# Patient Record
Sex: Female | Born: 1995 | Race: White | Hispanic: No | Marital: Single | Smoking: Former smoker
Health system: Southern US, Community
[De-identification: ages and names within clinical notes are randomized; demographics above are authoritative.]

## PROBLEM LIST (undated history)

## (undated) ENCOUNTER — Inpatient Hospital Stay (HOSPITAL_COMMUNITY): Payer: Self-pay

## (undated) DIAGNOSIS — R519 Headache, unspecified: Secondary | ICD-10-CM

## (undated) DIAGNOSIS — J45909 Unspecified asthma, uncomplicated: Secondary | ICD-10-CM

## (undated) DIAGNOSIS — F1210 Cannabis abuse, uncomplicated: Secondary | ICD-10-CM

## (undated) DIAGNOSIS — I480 Paroxysmal atrial fibrillation: Secondary | ICD-10-CM

## (undated) DIAGNOSIS — K219 Gastro-esophageal reflux disease without esophagitis: Secondary | ICD-10-CM

## (undated) DIAGNOSIS — F419 Anxiety disorder, unspecified: Secondary | ICD-10-CM

## (undated) DIAGNOSIS — I499 Cardiac arrhythmia, unspecified: Secondary | ICD-10-CM

## (undated) DIAGNOSIS — Z720 Tobacco use: Secondary | ICD-10-CM

## (undated) DIAGNOSIS — A419 Sepsis, unspecified organism: Secondary | ICD-10-CM

## (undated) DIAGNOSIS — Z87442 Personal history of urinary calculi: Secondary | ICD-10-CM

## (undated) DIAGNOSIS — K047 Periapical abscess without sinus: Secondary | ICD-10-CM

## (undated) HISTORY — PX: MOUTH SURGERY: SHX715

## (undated) HISTORY — PX: APPENDECTOMY: SHX54

---
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
## 2014-01-01 ENCOUNTER — Ambulatory Visit (INDEPENDENT_AMBULATORY_CARE_PROVIDER_SITE_OTHER): Payer: Medicaid Other | Admitting: Obstetrics

## 2014-01-01 ENCOUNTER — Encounter: Payer: Self-pay | Admitting: Obstetrics

## 2014-01-01 ENCOUNTER — Ambulatory Visit: Payer: Medicaid Other | Admitting: Obstetrics

## 2014-01-01 VITALS — BP 111/70 | HR 88 | Temp 98.4°F | Wt 112.0 lb

## 2014-01-01 DIAGNOSIS — N946 Dysmenorrhea, unspecified: Secondary | ICD-10-CM

## 2014-01-01 DIAGNOSIS — N920 Excessive and frequent menstruation with regular cycle: Secondary | ICD-10-CM

## 2014-01-01 LAB — POCT URINALYSIS DIPSTICK
SPEC GRAV UA: 1.01
pH, UA: 7

## 2014-01-01 NOTE — Progress Notes (Signed)
Subjective:     Laurie Richards is a 18 y.o. female here for a routine exam.  Current complaints: Pt states that she has Nexplanon, for 2 years, and has been having some abnormal bleeding.  Pt states that the bleeding has been worse for the past 6 months.  Pt states that bleeding may be every other month or she may have bleeding up to 6 weeks.  Pt states that she has cramps and bloating with cycles. Pt has no other complaints.  Pt states that she would like to change her birht control to Depo. Personal health questionnaire reviewed: yes.   Gynecologic History Patient's last menstrual period was 12/29/2013. Contraception: Nexplanon Last Pap: n/a Last mammogram: n/a  Obstetric History OB History  No data available     The following portions of the patient's history were reviewed and updated as appropriate: allergies, current medications, past family history, past medical history, past social history, past surgical history and problem list.  Review of Systems Pertinent items are noted in HPI.    Objective:    No exam performed today, Consult only.    Assessment:    AUB with Nexplanon.   Plan:    Education reviewed: safe sex/STD prevention and management of AUB with Nexplanon. Contraception: Nexplanon. Follow up in: 6 weeks. Lo Loestrin dispensed tid x 8 days.

## 2014-01-02 LAB — GC/CHLAMYDIA PROBE AMP
CT Probe RNA: NEGATIVE
GC Probe RNA: NEGATIVE

## 2014-01-09 ENCOUNTER — Encounter (HOSPITAL_COMMUNITY): Payer: Self-pay | Admitting: Emergency Medicine

## 2014-01-09 ENCOUNTER — Emergency Department (HOSPITAL_COMMUNITY)
Admission: EM | Admit: 2014-01-09 | Discharge: 2014-01-09 | Disposition: A | Discharge status: Home / Self Care | Payer: Medicaid Other | Attending: Emergency Medicine | Admitting: Emergency Medicine

## 2014-01-09 DIAGNOSIS — J029 Acute pharyngitis, unspecified: Secondary | ICD-10-CM | POA: Insufficient documentation

## 2014-01-09 NOTE — ED Provider Notes (Signed)
CSN: [id number]     Arrival date & time 01/09/14  0105 History   First MD Initiated Contact with Patient 01/09/14 0245     Chief Complaint  Patient presents with  . Sore Throat     Patient is a 18 y.o. female presenting with pharyngitis. The history is provided by the patient.  Sore Throat This is a new problem. The current episode started 2 days ago. The problem occurs constantly. The problem has been gradually worsening. The symptoms are aggravated by swallowing. Nothing relieves the symptoms.    PMH - none Past Surgical History  Procedure Laterality Date  . Mouth surgery     History reviewed. No pertinent family history. History  Substance Use Topics  . Smoking status: Never Smoker   . Smokeless tobacco: Not on file  . Alcohol Use: No   OB History   Grav Para Term Preterm Abortions TAB SAB Ect Mult Living                 Review of Systems  Constitutional: Negative for fever.  HENT: Positive for sore throat.   Respiratory: Positive for cough.   Gastrointestinal: Negative for vomiting.  Neurological: Negative for weakness.      Allergies  Acetaminophen-codeine  Home Medications   Current Outpatient Rx  Name  Route  Sig  Dispense  Refill  . acetaminophen (TYLENOL) 500 MG tablet   Oral   Take 500 mg by mouth once as needed. For pain          . ibuprofen (ADVIL,MOTRIN) 800 MG tablet      TAKE ONE TABLET UP TO 3 TIMES A DAY--8 HRS APART FOR PAIN AS NEEDED   30 tablet   1    BP 112/68  Pulse 103  Temp(Src) 98.5 F (36.9 C) (Oral)  Resp 17  Ht 5\' 6"  (1.676 m)  Wt 112 lb (50.803 kg)  BMI 18.09 kg/m2  SpO2 100%  LMP 01/03/2014 Physical Exam CONSTITUTIONAL: Well developed/well nourished HEAD: Normocephalic/atraumatic EYES: EOMI/PERRL ENMT: Mucous membranes moist, uvula midline, no erythema/exduates noted to oropharynx, normal phonation, no stridor/drooling noted NECK: supple no meningeal signs CV: S1/S2 noted, no murmurs/rubs/gallops noted LUNGS:  Lungs are clear to auscultation bilaterally, no apparent distress ABDOMEN: soft, nontender, no rebound or guarding NEURO: Pt is awake/alert, moves all extremitiesx4 EXTREMITIES: pulses normal, full ROM SKIN: warm, color normal PSYCH: no abnormalities of mood noted  ED Course  Procedures   Suspect viral infection, stable for d/c home MDM   Final diagnoses:  Sore throat    Nursing notes including past medical history and social history reviewed and considered in documentation     [name], MD 01/09/14 [phone]

## 2014-01-09 NOTE — ED Notes (Signed)
Pt c/o sore throat since Saturday.

## 2014-02-14 ENCOUNTER — Encounter: Payer: Self-pay | Admitting: Obstetrics

## 2014-05-08 ENCOUNTER — Telehealth: Payer: Self-pay | Admitting: *Deleted

## 2014-05-08 NOTE — Telephone Encounter (Signed)
Per Dr Clearance Coots- patient to monitor- call back next month if still no cycle and symptoms still present. May do qualitative Hcg test. Patient voiced understanding and is ok with that plan.

## 2014-05-08 NOTE — Telephone Encounter (Signed)
Patient c/o symptoms of pregnancy on birth control. 05/08/2014 5:00 Spoke with patient- she had Nexplanon put in 03/23/2012 and has had a monthly cycle since insertion except for 1 episode of heavy bleeding which was treated with OCP. Patient states she last had a cycle on 5/24. Patient reports nausea in am, hypersensitivity to odors, breast tenderness. OTC home UPT- negative. Will discuss symptoms with Dr Clearance Coots and call her back.

## 2014-07-23 ENCOUNTER — Institutional Professional Consult (permissible substitution): Payer: Self-pay | Admitting: Obstetrics

## 2014-08-03 ENCOUNTER — Emergency Department (HOSPITAL_COMMUNITY)
Admission: EM | Admit: 2014-08-03 | Discharge: 2014-08-03 | Disposition: A | Discharge status: Home / Self Care | Payer: Medicaid Other | Attending: Emergency Medicine | Admitting: Emergency Medicine

## 2014-08-03 ENCOUNTER — Emergency Department (HOSPITAL_COMMUNITY): Payer: Medicaid Other

## 2014-08-03 ENCOUNTER — Encounter (HOSPITAL_COMMUNITY): Payer: Self-pay | Admitting: Emergency Medicine

## 2014-08-03 DIAGNOSIS — Z3202 Encounter for pregnancy test, result negative: Secondary | ICD-10-CM | POA: Insufficient documentation

## 2014-08-03 DIAGNOSIS — R079 Chest pain, unspecified: Secondary | ICD-10-CM | POA: Diagnosis present

## 2014-08-03 DIAGNOSIS — R0602 Shortness of breath: Secondary | ICD-10-CM | POA: Diagnosis not present

## 2014-08-03 LAB — PREGNANCY, URINE: PREG TEST UR: NEGATIVE

## 2014-08-03 NOTE — ED Notes (Signed)
Pt has chest pain starting about 3 hours ago.

## 2014-08-03 NOTE — Discharge Instructions (Signed)

## 2014-08-03 NOTE — ED Provider Notes (Signed)
CSN: [id number]     Arrival date & time 08/03/14  0426 History   First MD Initiated Contact with Patient 08/03/14 0441     Chief Complaint  Patient presents with  . Chest Pain     (Consider location/radiation/quality/duration/timing/severity/associated sxs/prior Treatment) Patient is a 18 y.o. female presenting with chest pain. The history is provided by the patient.  Chest Pain Associated symptoms: shortness of breath   Associated symptoms: no abdominal pain, no back pain, no headache, no nausea, no numbness, not vomiting and no weakness    patient presents with pounding chest pain that woke her this morning. It is dull. Mild shortness of breath. No cough. No hemoptysis. No trauma. Per patient's mother she did do a lot of dancing. She's not had pains like this before. No lightheadedness or dizziness. The pain continues. She denies pregnancy. She states she did recently have a cold. History reviewed. No pertinent past medical history. Past Surgical History  Procedure Laterality Date  . Mouth surgery     History reviewed. No pertinent family history. History  Substance Use Topics  . Smoking status: Never Smoker   . Smokeless tobacco: Not on file  . Alcohol Use: No   OB History   Grav Para Term Preterm Abortions TAB SAB Ect Mult Living                 Review of Systems  Constitutional: Negative for activity change and appetite change.  Eyes: Negative for pain.  Respiratory: Positive for shortness of breath. Negative for chest tightness.   Cardiovascular: Positive for chest pain. Negative for leg swelling.  Gastrointestinal: Negative for nausea, vomiting, abdominal pain and diarrhea.  Genitourinary: Negative for flank pain.  Musculoskeletal: Negative for back pain and neck stiffness.  Skin: Negative for rash.  Neurological: Negative for weakness, numbness and headaches.  Psychiatric/Behavioral: Negative for behavioral problems.      Allergies   Acetaminophen-codeine  Home Medications   Prior to Admission medications   Medication Sig Start Date End Date Taking? Authorizing Provider  acetaminophen (TYLENOL) 500 MG tablet Take 500 mg by mouth once as needed. For pain     Historical Provider, MD  ibuprofen (ADVIL,MOTRIN) 800 MG tablet TAKE ONE TABLET UP TO 3 TIMES A DAY--8 HRS APART FOR PAIN AS NEEDED 07/20/13   Brock Bad, MD   BP 116/83  Pulse 78  Temp(Src) 98.8 F (37.1 C) (Oral)  Resp 20  Ht 5\' 5"  (1.651 m)  Wt 120 lb (54.432 kg)  BMI 19.97 kg/m2  SpO2 99% Physical Exam  Constitutional: She appears well-developed and well-nourished.  HENT:  Head: Normocephalic.  Cardiovascular: Normal rate and regular rhythm.   Pulmonary/Chest: Effort normal and breath sounds normal. She exhibits tenderness.  Tenderness anterior lower chest On midline. No crepitance or deformity. no rash.  Abdominal: Soft. There is no tenderness.  Musculoskeletal: She exhibits no edema.  Neurological: She is alert.  Skin: Skin is warm. No rash noted.    ED Course  Procedures (including critical care time) Labs Review Labs Reviewed  PREGNANCY, URINE    Imaging Review Dg Chest 2 View  08/03/2014   CLINICAL DATA:  Shortness of breath  EXAM: CHEST  2 VIEW  COMPARISON:  10/12/2012  FINDINGS: Normal heart size and mediastinal contours. No acute infiltrate or edema. No effusion or pneumothorax. No acute osseous findings.  IMPRESSION: No active cardiopulmonary disease.   Electronically Signed   By: Tiburcio Pea M.D.   On: 08/03/2014 05:50  EKG Interpretation None      MDM   Final diagnoses:  Chest pain, unspecified chest pain type    Patient with chest pain. EKG reassuring and x-rays negative. Low risk for coronary disease. Doubt pulmonary embolism. Will discharge home.    Juliet Rude. Rubin Payor, MD 08/03/14 [phone]

## 2014-09-11 ENCOUNTER — Telehealth: Payer: Self-pay | Admitting: *Deleted

## 2014-09-11 NOTE — Telephone Encounter (Signed)
Patient reports she is having symptoms with her Nexplanon. Patient has an appointment on 11/19 to talk about options- SE pain in arm, headache, AUB LM with mother for patient to call if her needs are more immediate than her appointment.

## 2014-09-27 ENCOUNTER — Institutional Professional Consult (permissible substitution): Payer: Medicaid Other | Admitting: Obstetrics

## 2014-10-17 ENCOUNTER — Institutional Professional Consult (permissible substitution): Payer: Medicaid Other | Admitting: Obstetrics

## 2014-10-30 ENCOUNTER — Encounter (HOSPITAL_COMMUNITY): Payer: Self-pay | Admitting: Emergency Medicine

## 2014-10-30 DIAGNOSIS — Z23 Encounter for immunization: Secondary | ICD-10-CM | POA: Insufficient documentation

## 2014-10-30 DIAGNOSIS — Y288XXA Contact with other sharp object, undetermined intent, initial encounter: Secondary | ICD-10-CM | POA: Diagnosis not present

## 2014-10-30 DIAGNOSIS — Y998 Other external cause status: Secondary | ICD-10-CM | POA: Diagnosis not present

## 2014-10-30 DIAGNOSIS — Y9289 Other specified places as the place of occurrence of the external cause: Secondary | ICD-10-CM | POA: Diagnosis not present

## 2014-10-30 DIAGNOSIS — Y9389 Activity, other specified: Secondary | ICD-10-CM | POA: Insufficient documentation

## 2014-10-30 DIAGNOSIS — Z720 Tobacco use: Secondary | ICD-10-CM | POA: Insufficient documentation

## 2014-10-30 DIAGNOSIS — Z791 Long term (current) use of non-steroidal anti-inflammatories (NSAID): Secondary | ICD-10-CM | POA: Insufficient documentation

## 2014-10-30 DIAGNOSIS — S61212A Laceration without foreign body of right middle finger without damage to nail, initial encounter: Secondary | ICD-10-CM | POA: Insufficient documentation

## 2014-10-30 NOTE — ED Notes (Signed)
Pt with laceration to R middle finger, cut on a piece of metal in her brother's car.

## 2014-10-31 ENCOUNTER — Emergency Department (HOSPITAL_COMMUNITY)
Admission: EM | Admit: 2014-10-31 | Discharge: 2014-10-31 | Disposition: A | Discharge status: Home / Self Care | Payer: Medicaid Other | Attending: Emergency Medicine | Admitting: Emergency Medicine

## 2014-10-31 DIAGNOSIS — S61219A Laceration without foreign body of unspecified finger without damage to nail, initial encounter: Secondary | ICD-10-CM

## 2014-10-31 MED ORDER — LIDOCAINE HCL (PF) 2 % IJ SOLN
10.0000 mL | Freq: Once | INTRAMUSCULAR | Status: AC
  Administered 2014-10-31: 10 mL
  Filled 2014-10-31: qty 10

## 2014-10-31 MED ORDER — TETANUS-DIPHTH-ACELL PERTUSSIS 5-2.5-18.5 LF-MCG/0.5 IM SUSP
0.5000 mL | Freq: Once | INTRAMUSCULAR | Status: AC
  Administered 2014-10-31: 0.5 mL via INTRAMUSCULAR
  Filled 2014-10-31: qty 0.5

## 2014-10-31 MED ORDER — BACITRACIN ZINC 500 UNIT/GM EX OINT
TOPICAL_OINTMENT | CUTANEOUS | Status: AC
  Administered 2014-10-31: 02:00:00
  Filled 2014-10-31: qty 0.9

## 2014-10-31 NOTE — ED Provider Notes (Signed)
CSN: [id number]     Arrival date & time 10/30/14  2056 History   First MD Initiated Contact with Patient 10/30/14 2355     Chief Complaint  Patient presents with  . Laceration     (Consider location/radiation/quality/duration/timing/severity/associated sxs/prior Treatment) Patient is a 18 y.o. female presenting with skin laceration. The history is provided by the patient and a relative.  Laceration Location:  Finger Finger laceration location:  R middle finger Depth:  Through dermis Time since incident:  3 hours Laceration mechanism:  Metal edge Pain details:    Quality:  Aching   Severity:  Moderate   Timing:  Constant   Progression:  Unchanged Foreign body present:  No foreign bodies Relieved by:  None tried Worsened by:  Nothing tried Ineffective treatments:  None tried Tetanus status:  Out of date  Laurie Richards is a 18 y.o. female who presents to the ED with a laceration to the right middle finger. She cut the finger on a piece of metal on her brother's car. Bleeding controlled. Denies any other injuries.   History reviewed. No pertinent past medical history. Past Surgical History  Procedure Laterality Date  . Mouth surgery     Family History  Problem Relation Age of Onset  . Heart failure Mother   . Diabetes Other    History  Substance Use Topics  . Smoking status: Current Every Day Smoker -- 0.50 packs/day    Types: Cigarettes  . Smokeless tobacco: Never Used  . Alcohol Use: No   OB History    No data available     Review of Systems Negative except as stated in HPI   Allergies  Acetaminophen-codeine  Home Medications   Prior to Admission medications   Medication Sig Start Date End Date Taking? Authorizing Provider  acetaminophen (TYLENOL) 500 MG tablet Take 500 mg by mouth once as needed. For pain     Historical Provider, MD  ibuprofen (ADVIL,MOTRIN) 800 MG tablet TAKE ONE TABLET UP TO 3 TIMES A DAY--8 HRS APART FOR PAIN AS NEEDED 07/20/13    Brock Bad, MD   BP 98/56 mmHg  Pulse 93  Ht 5\' 5"  (1.651 m)  Wt 113 lb (51.256 kg)  BMI 18.80 kg/m2  SpO2 98%  LMP 10/20/2014 Physical Exam  Constitutional: She is oriented to person, place, and time. She appears well-developed and well-nourished.  HENT:  Head: Normocephalic and atraumatic.  Eyes: Conjunctivae and EOM are normal.  Neck: Neck supple.  Cardiovascular: Normal rate.   Pulmonary/Chest: Effort normal.  Musculoskeletal: Normal range of motion.       Right hand: She exhibits tenderness and laceration. She exhibits normal range of motion, normal capillary refill, no deformity and no swelling. Normal sensation noted. Normal strength noted.       Hands:  1.5 cm Flap laceration to the palmar aspect of the right middle finger.   Neurological: She is alert and oriented to person, place, and time. No cranial nerve deficit.  Skin: Skin is warm and dry.  Psychiatric: She has a normal mood and affect. Her behavior is normal.  Nursing note and vitals reviewed.   ED Course  Procedures  LACERATION REPAIR Performed by: NEESE,HOPE Authorized by: NEESE,HOPE Consent: Verbal consent obtained. Risks and benefits: risks, benefits and alternatives were discussed Consent given by: patient Patient identity confirmed: provided demographic data Prepped and Draped in normal sterile fashion  Cleaned with betadine  Wound explored  Laceration Location: right middle finger  Laceration Length: 1.5  cm  No Foreign Bodies seen or palpated  Anesthesia: local infiltration  Local anesthetic: lidocaine 2% without epinephrine  Anesthetic total: 1ml  Irrigation method: syringe Amount of cleaning: standard  Skin closure: 5-0 prolene  Number of sutures: 4  Technique: interrupted  Patient tolerance: Patient tolerated the procedure well with no immediate complications.   Tetanus updated Sterile dressing appliled MDM  18 y.o. female with laceration to the right middle finger  stable for discharge without neurovascular deficits. Discussed with the patient plan of care and all questioned fully answered. She will return if any problems arise.   Final diagnoses:  Laceration of finger, right, initial encounter        [facility] Surgery LLCope M Neese, NP 10/31/14 0127  Dione Boozeavid Glick, MD 10/31/14 [phone]

## 2014-12-14 ENCOUNTER — Telehealth: Payer: Self-pay | Admitting: *Deleted

## 2014-12-14 DIAGNOSIS — N390 Urinary tract infection, site not specified: Secondary | ICD-10-CM

## 2014-12-14 MED ORDER — NITROFURANTOIN MONOHYD MACRO 100 MG PO CAPS: 100.0000 mg | ORAL_CAPSULE | Freq: Two times a day (BID) | ORAL | Status: DC

## 2014-12-14 MED ORDER — PHENAZOPYRIDINE HCL 200 MG PO TABS: 200.0000 mg | ORAL_TABLET | Freq: Three times a day (TID) | ORAL | Status: DC | PRN

## 2014-12-14 NOTE — Telephone Encounter (Signed)
Patient called- she is having pain and pressure with urination. Per provider- can call in Macrobid and pyridium.

## 2015-03-26 ENCOUNTER — Telehealth: Payer: Self-pay | Admitting: *Deleted

## 2015-03-26 NOTE — Telephone Encounter (Signed)
1:12 Patient called back 5:19 LM on VM to CB

## 2015-03-26 NOTE — Telephone Encounter (Signed)
Patient thinks she has a UTI 3:57 LM on VM to CB

## 2015-04-16 NOTE — Telephone Encounter (Signed)
Patient never called the office back.

## 2016-11-09 HISTORY — PX: APPENDECTOMY: SHX54

## 2017-01-20 ENCOUNTER — Ambulatory Visit: Payer: Medicaid Other | Admitting: Obstetrics

## 2017-04-30 ENCOUNTER — Encounter (HOSPITAL_COMMUNITY): Payer: Self-pay | Admitting: *Deleted

## 2017-04-30 ENCOUNTER — Emergency Department (HOSPITAL_COMMUNITY)
Admission: EM | Admit: 2017-04-30 | Discharge: 2017-04-30 | Disposition: A | Discharge status: Home / Self Care | Payer: Self-pay | Attending: Emergency Medicine | Admitting: Emergency Medicine

## 2017-04-30 DIAGNOSIS — R112 Nausea with vomiting, unspecified: Secondary | ICD-10-CM | POA: Insufficient documentation

## 2017-04-30 DIAGNOSIS — F17210 Nicotine dependence, cigarettes, uncomplicated: Secondary | ICD-10-CM | POA: Insufficient documentation

## 2017-04-30 LAB — URINALYSIS, ROUTINE W REFLEX MICROSCOPIC
Bilirubin Urine: NEGATIVE
Glucose, UA: NEGATIVE mg/dL
Ketones, ur: NEGATIVE mg/dL
Leukocytes, UA: NEGATIVE
Nitrite: NEGATIVE
PH: 5 (ref 5.0–8.0)
PROTEIN: NEGATIVE mg/dL
Specific Gravity, Urine: 1.021 (ref 1.005–1.030)

## 2017-04-30 LAB — CBC
HCT: 40.5 % (ref 36.0–46.0)
Hemoglobin: 13.3 g/dL (ref 12.0–15.0)
MCH: 26.1 pg (ref 26.0–34.0)
MCHC: 32.8 g/dL (ref 30.0–36.0)
MCV: 79.4 fL (ref 78.0–100.0)
PLATELETS: 216 10*3/uL (ref 150–400)
RBC: 5.1 MIL/uL (ref 3.87–5.11)
RDW: 14.5 % (ref 11.5–15.5)
WBC: 5.9 10*3/uL (ref 4.0–10.5)

## 2017-04-30 LAB — COMPREHENSIVE METABOLIC PANEL
ALBUMIN: 3.9 g/dL (ref 3.5–5.0)
ALT: 12 U/L — ABNORMAL LOW (ref 14–54)
AST: 17 U/L (ref 15–41)
Alkaline Phosphatase: 66 U/L (ref 38–126)
Anion gap: 7 (ref 5–15)
BUN: 7 mg/dL (ref 6–20)
CO2: 23 mmol/L (ref 22–32)
Calcium: 8.9 mg/dL (ref 8.9–10.3)
Chloride: 106 mmol/L (ref 101–111)
Creatinine, Ser: 0.75 mg/dL (ref 0.44–1.00)
GFR calc Af Amer: >60 mL/min (ref >60)
GFR calc non Af Amer: >60 mL/min (ref >60)
GLUCOSE: 92 mg/dL (ref 65–99)
Potassium: 3.3 mmol/L — ABNORMAL LOW (ref 3.5–5.1)
SODIUM: 136 mmol/L (ref 135–145)
Total Bilirubin: 0.6 mg/dL (ref 0.3–1.2)
Total Protein: 6.7 g/dL (ref 6.5–8.1)

## 2017-04-30 LAB — I-STAT BETA HCG BLOOD, ED (MC, WL, AP ONLY): I-stat hCG, quantitative: <5 m[IU]/mL (ref <5)

## 2017-04-30 MED ORDER — DOXYLAMINE SUCCINATE (SLEEP) 25 MG PO TABS: 12.5000 mg | ORAL_TABLET | Freq: Three times a day (TID) | ORAL | 0 refills | Status: DC | PRN

## 2017-04-30 MED ORDER — POTASSIUM CHLORIDE CRYS ER 20 MEQ PO TBCR
40.0000 meq | EXTENDED_RELEASE_TABLET | Freq: Once | ORAL | Status: AC
  Administered 2017-04-30: 40 meq via ORAL
  Filled 2017-04-30: qty 2

## 2017-04-30 MED ORDER — VITAMIN B-6 25 MG PO TABS: 25.0000 mg | ORAL_TABLET | Freq: Three times a day (TID) | ORAL | 0 refills | Status: DC | PRN

## 2017-04-30 MED ORDER — CVS PRENATAL MULTI+DHA 27-0.8-250 MG PO CAPS: 1.0000 | ORAL_CAPSULE | Freq: Every day | ORAL | 3 refills | Status: DC

## 2017-04-30 NOTE — ED Notes (Signed)
ED Provider at bedside. 

## 2017-04-30 NOTE — ED Triage Notes (Signed)
Pt would like a pregnancy test so that she can have medical coverage if she's pregnant.  Has been nauseated x 1 month.  Denies abdominal pain. Appendix removed 2 months ago.

## 2017-04-30 NOTE — ED Provider Notes (Signed)
[facility]-EMERGENCY DEPT Provider Note   CSN: [id number] Arrival date & time: 04/30/17  1418     History   Chief Complaint Chief Complaint  Patient presents with  . Nausea    wants pregnancy test    HPI Laurie Richards is a 21 y.o. female.  HPI   21 yo F with PMhx as below here with nausea, vomiting, breast pain. Pt states her last period was approx 2 months ago. She has a h/o painful but not irregular periods. She is trying to get pregnant with her fiance so presents for pregnancy eval. She has had intermittent nausea, vomiting for the last 2 weeks. No diarrhea. No abdominal pain. Denies any vaginal bleeding or discharge. No adnexal pain. She also noticed breast soreness like she is going to have a pregnancy or period but has not had a full period yet.  History reviewed. No pertinent past medical history.  There are no active problems to display for this patient.   Past Surgical History:  Procedure Laterality Date  . APPENDECTOMY    . MOUTH SURGERY      OB History    No data available       Home Medications    Prior to Admission medications   Medication Sig Start Date End Date Taking? Authorizing Provider  acetaminophen (TYLENOL) 500 MG tablet Take 500 mg by mouth once as needed. For pain     [provider]  doxylamine, Sleep, (UNISOM) 25 MG tablet Take 0.5 tablets (12.5 mg total) by mouth 3 (three) times daily as needed (nausea). 04/30/17   Shaune Pollack, MD  ibuprofen (ADVIL,MOTRIN) 800 MG tablet TAKE ONE TABLET UP TO 3 TIMES A DAY--8 HRS APART FOR PAIN AS NEEDED 07/20/13   Brock Bad, MD  nitrofurantoin, macrocrystal-monohydrate, (MACROBID) 100 MG capsule Take 1 capsule (100 mg total) by mouth 2 (two) times daily. 1 po BID x 7days 12/14/14   Brock Bad, MD  phenazopyridine (PYRIDIUM) 200 MG tablet Take 1 tablet (200 mg total) by mouth 3 (three) times daily as needed for pain. 12/14/14   Brock Bad, MD  Prenatal MV-Min-Fe Fum-FA-DHA ([facility]  PRENATAL MULTI+DHA) 27-0.8-250 MG CAPS Take 1 tablet by mouth daily. 04/30/17   Shaune Pollack, MD  vitamin B-6 (PYRIDOXINE) 25 MG tablet Take 1 tablet (25 mg total) by mouth 3 (three) times daily as needed. With Doxylamine for nausea 04/30/17   Shaune Pollack, MD    Family History Family History  Problem Relation Age of Onset  . Heart failure Mother   . Diabetes Other     Social History Social History  Substance Use Topics  . Smoking status: Current Every Day Smoker    Packs/day: 0.50    Types: Cigarettes  . Smokeless tobacco: Never Used  . Alcohol use No     Allergies   Acetaminophen-codeine   Review of Systems Review of Systems  Gastrointestinal: Positive for nausea and vomiting.  All other systems reviewed and are negative.    Physical Exam Updated Vital Signs BP 120/73   Pulse 91   Temp 98.3 F (36.8 C) (Oral)   Resp 18   Ht 5\' 4"  (1.626 m)   Wt 50.5 kg (111 lb 4.8 oz)   LMP 03/10/2017   SpO2 99%   BMI 19.10 kg/m   Physical Exam  Constitutional: She is oriented to person, place, and time. She appears well-developed and well-nourished. No distress.  Smiling, laughing, sitting with legs crunched up in NAD  HENT:  Head: Normocephalic and atraumatic.  Eyes: Conjunctivae are normal.  Neck: Neck supple.  Cardiovascular: Normal rate, regular rhythm and normal heart sounds.  Exam reveals no friction rub.   No murmur heard. Pulmonary/Chest: Effort normal and breath sounds normal. No respiratory distress. She has no wheezes. She has no rales.  Abdominal: Soft. Bowel sounds are normal. She exhibits no distension. There is no tenderness. There is no rebound and no guarding.  Musculoskeletal: She exhibits no edema.  Neurological: She is alert and oriented to person, place, and time. She exhibits normal muscle tone.  Skin: Skin is warm. Capillary refill takes less than 2 seconds.  Psychiatric: She has a normal mood and affect.  Nursing note and vitals  reviewed.    ED Treatments / Results  Labs (all labs ordered are listed, but only abnormal results are displayed) Labs Reviewed  COMPREHENSIVE METABOLIC PANEL - Abnormal; Notable for the following:       Result Value   Potassium 3.3 (*)    ALT 12 (*)    All other components within normal limits  URINALYSIS, ROUTINE W REFLEX MICROSCOPIC - Abnormal; Notable for the following:    APPearance HAZY (*)    Hgb urine dipstick SMALL (*)    Bacteria, UA RARE (*)    Squamous Epithelial / LPF 6-30 (*)    All other components within normal limits  CBC  I-STAT BETA HCG BLOOD, ED ([facility], [facility], [facility] ONLY)    EKG  EKG Interpretation None       Radiology No results found.  Procedures Procedures (including critical care time)  Medications Ordered in ED Medications  potassium chloride SA (K-DUR,KLOR-CON) CR tablet 40 mEq (not administered)     Initial Impression / Assessment and Plan / ED Course  I have reviewed the triage vital signs and the nursing notes.  Pertinent labs & imaging results that were available during my care of the patient were reviewed by me and considered in my medical decision making (see chart for details).     21 yo F with PMHx as above here with nausea, vomiting, and desire for pregnancy test. No abd pain. On arrival, VSS and WNL. Exam as above, with no focal TTP to suggest obstruction, peritonitis. No suprapubic or adnexal TTP. HCG is negative. Pt is hopeful that she is pregnant and this could be contributing to some of her subjective sx, as no apparent etiology on my exam. UA neg for UTI. She is tolerating PO here withotu difficulty. Sx may also be 2/2 hormonal changes in setting of recent weight gain, irregular periods, possible anovulatory cycle. Will give brief course of antiemetics, refer to [facility], and start on prenatals. Pt in agreement. Return precautions given.  Final Clinical Impressions(s) / ED Diagnoses   Final diagnoses:  Non-intractable vomiting with  nausea, unspecified vomiting type    New Prescriptions New Prescriptions   DOXYLAMINE, SLEEP, (UNISOM) 25 MG TABLET    Take 0.5 tablets (12.5 mg total) by mouth 3 (three) times daily as needed (nausea).   PRENATAL MV-MIN-FE FUM-FA-DHA ([facility] PRENATAL MULTI+DHA) 27-0.8-250 MG CAPS    Take 1 tablet by mouth daily.   VITAMIN B-6 (PYRIDOXINE) 25 MG TABLET    Take 1 tablet (25 mg total) by mouth 3 (three) times daily as needed. With Doxylamine for nausea     Shaune Pollack, MD 04/30/17 [phone]

## 2017-06-26 ENCOUNTER — Encounter (HOSPITAL_COMMUNITY): Payer: Self-pay | Admitting: *Deleted

## 2017-06-26 ENCOUNTER — Inpatient Hospital Stay (HOSPITAL_COMMUNITY)
Admission: AD | Admit: 2017-06-26 | Discharge: 2017-06-26 | Disposition: A | Discharge status: Home / Self Care | Payer: Self-pay | Source: Ambulatory Visit | Attending: Obstetrics and Gynecology | Admitting: Obstetrics and Gynecology

## 2017-06-26 DIAGNOSIS — F17210 Nicotine dependence, cigarettes, uncomplicated: Secondary | ICD-10-CM | POA: Insufficient documentation

## 2017-06-26 DIAGNOSIS — O99331 Smoking (tobacco) complicating pregnancy, first trimester: Secondary | ICD-10-CM | POA: Insufficient documentation

## 2017-06-26 DIAGNOSIS — O4691 Antepartum hemorrhage, unspecified, first trimester: Secondary | ICD-10-CM

## 2017-06-26 DIAGNOSIS — O26899 Other specified pregnancy related conditions, unspecified trimester: Secondary | ICD-10-CM

## 2017-06-26 DIAGNOSIS — O4690 Antepartum hemorrhage, unspecified, unspecified trimester: Secondary | ICD-10-CM

## 2017-06-26 DIAGNOSIS — Z3A01 Less than 8 weeks gestation of pregnancy: Secondary | ICD-10-CM | POA: Insufficient documentation

## 2017-06-26 DIAGNOSIS — O209 Hemorrhage in early pregnancy, unspecified: Secondary | ICD-10-CM | POA: Insufficient documentation

## 2017-06-26 DIAGNOSIS — R109 Unspecified abdominal pain: Secondary | ICD-10-CM | POA: Insufficient documentation

## 2017-06-26 DIAGNOSIS — Z6790 Unspecified blood type, Rh positive: Secondary | ICD-10-CM | POA: Insufficient documentation

## 2017-06-26 HISTORY — DX: Cardiac arrhythmia, unspecified: I49.9

## 2017-06-26 LAB — URINALYSIS, ROUTINE W REFLEX MICROSCOPIC
BILIRUBIN URINE: NEGATIVE
GLUCOSE, UA: NEGATIVE mg/dL
Ketones, ur: NEGATIVE mg/dL
Leukocytes, UA: NEGATIVE
NITRITE: NEGATIVE
Protein, ur: NEGATIVE mg/dL
RBC / HPF: NONE SEEN RBC/hpf (ref 0–5)
Specific Gravity, Urine: 1.015 (ref 1.005–1.030)
Squamous Epithelial / LPF: NONE SEEN
WBC UA: NONE SEEN WBC/hpf (ref 0–5)
pH: 7 (ref 5.0–8.0)

## 2017-06-26 LAB — POCT PREGNANCY, URINE: Preg Test, Ur: POSITIVE — AB

## 2017-06-26 LAB — WET PREP, GENITAL
Sperm: NONE SEEN
Trich, Wet Prep: NONE SEEN
Yeast Wet Prep HPF POC: NONE SEEN

## 2017-06-26 LAB — CBC
HCT: 34.2 % — ABNORMAL LOW (ref 36.0–46.0)
HEMOGLOBIN: 11.2 g/dL — AB (ref 12.0–15.0)
MCH: 27.3 pg (ref 26.0–34.0)
MCHC: 32.7 g/dL (ref 30.0–36.0)
MCV: 83.4 fL (ref 78.0–100.0)
Platelets: 219 10*3/uL (ref 150–400)
RBC: 4.1 MIL/uL (ref 3.87–5.11)
RDW: 16.4 % — AB (ref 11.5–15.5)
WBC: 9.4 10*3/uL (ref 4.0–10.5)

## 2017-06-26 LAB — HCG, QUANTITATIVE, PREGNANCY: hCG, Beta Chain, Quant, S: 34 m[IU]/mL — ABNORMAL HIGH (ref <5)

## 2017-06-26 LAB — ABO/RH: ABO/RH(D): A POS

## 2017-06-26 NOTE — Progress Notes (Signed)
Written and verbal d/c instructions given and understanding voiced. Pt to return Monday at 1100 in clinic for repeat blood work or sooner for any problems.

## 2017-06-26 NOTE — MAU Provider Note (Signed)
History     CSN: [id number]  Arrival date and time: 06/26/17 0044   First Provider Initiated Contact with Patient 06/26/17 [phone]      Chief Complaint  Patient presents with  . Abdominal Pain   20 y.o G3P2002 at unknown gestation here with LAP. Pain started a few hrs ago. Describes as sharp and intermittent, worse with standing, better with lying. No fevers. No urinary sx. Had small amt of pink spotting today and brown discharge 2 weeks ago. No recent IC. She thinks her last menses was is in June. She was incarcerated until a week ago and had a positive pregnancy test 2 weeks ago while in jail.    OB History    Gravida Para Term Preterm AB Living   3       2 0   SAB TAB Ectopic Multiple Live Births   2              Past Medical History:  Diagnosis Date  . Irregular heartbeat     Past Surgical History:  Procedure Laterality Date  . APPENDECTOMY    . MOUTH SURGERY      Family History  Problem Relation Age of Onset  . Heart failure Mother   . Diabetes Other     Social History  Substance Use Topics  . Smoking status: Current Every Day Smoker    Packs/day: 0.50    Types: Cigarettes  . Smokeless tobacco: Never Used  . Alcohol use Yes    Allergies:  Allergies  Allergen Reactions  . Acetaminophen-Codeine Itching and Swelling    Rash, vomiting if takes on empty stomach    Prescriptions Prior to Admission  Medication Sig Dispense Refill Last Dose  . acetaminophen (TYLENOL) 500 MG tablet Take 500 mg by mouth once as needed. For pain    06/25/2017 at Unknown time  . Prenatal MV-Min-Fe Fum-FA-DHA ([facility] PRENATAL MULTI+DHA) 27-0.8-250 MG CAPS Take 1 tablet by mouth daily. 30 capsule 3 06/25/2017 at Unknown time  . doxylamine, Sleep, (UNISOM) 25 MG tablet Take 0.5 tablets (12.5 mg total) by mouth 3 (three) times daily as needed (nausea). 30 tablet 0   . ibuprofen (ADVIL,MOTRIN) 800 MG tablet TAKE ONE TABLET UP TO 3 TIMES A DAY--8 HRS APART FOR PAIN AS NEEDED 30 tablet 1  Taking  . nitrofurantoin, macrocrystal-monohydrate, (MACROBID) 100 MG capsule Take 1 capsule (100 mg total) by mouth 2 (two) times daily. 1 po BID x 7days 14 capsule 0   . phenazopyridine (PYRIDIUM) 200 MG tablet Take 1 tablet (200 mg total) by mouth 3 (three) times daily as needed for pain. 9 tablet 0   . vitamin B-6 (PYRIDOXINE) 25 MG tablet Take 1 tablet (25 mg total) by mouth 3 (three) times daily as needed. With Doxylamine for nausea 30 tablet 0     Review of Systems  Constitutional: Negative for fever.  Gastrointestinal: Positive for abdominal pain. Negative for constipation and diarrhea.  Genitourinary: Positive for vaginal bleeding. Negative for dysuria, frequency, urgency and vaginal discharge.   Physical Exam   Blood pressure 108/69, pulse (!) 111, temperature 98.3 F (36.8 C), resp. rate 18, height 5\' 4"  (1.626 m), weight 112 lb (50.8 kg), last menstrual period 04/14/2017.  Physical Exam  Nursing note and vitals reviewed. Constitutional: She is oriented to person, place, and time. She appears well-developed and well-nourished. No distress (appears comfortable).  HENT:  Head: Normocephalic and atraumatic.  Neck: Normal range of motion.  Respiratory: Effort normal. No respiratory  distress.  GI: Soft. She exhibits no distension and no mass. There is no tenderness. There is no rebound and no guarding.  Genitourinary:  Genitourinary Comments: External: no lesions or erythema Vagina: rugated, pink, moist, small amt bloody discharge in vault Uterus: non enlarged, anteverted, no tender, no CMT Adnexae: no masses, no tenderness left, no tenderness right   Musculoskeletal: Normal range of motion.  Neurological: She is alert and oriented to person, place, and time.  Skin: Skin is warm and dry.  Psychiatric: She has a normal mood and affect.   Results for orders placed or performed during the hospital encounter of 06/26/17 (from the past 24 hour(s))  Urinalysis, Routine w reflex  microscopic     Status: Abnormal   Collection Time: 06/26/17  1:10 AM  Result Value Ref Range   Color, Urine YELLOW YELLOW   APPearance TURBID (A) CLEAR   Specific Gravity, Urine 1.015 1.005 - 1.030   pH 7.0 5.0 - 8.0   Glucose, UA NEGATIVE NEGATIVE mg/dL   Hgb urine dipstick LARGE (A) NEGATIVE   Bilirubin Urine NEGATIVE NEGATIVE   Ketones, ur NEGATIVE NEGATIVE mg/dL   Protein, ur NEGATIVE NEGATIVE mg/dL   Nitrite NEGATIVE NEGATIVE   Leukocytes, UA NEGATIVE NEGATIVE   RBC / HPF NONE SEEN 0 - 5 RBC/hpf   WBC, UA NONE SEEN 0 - 5 WBC/hpf   Bacteria, UA RARE (A) NONE SEEN   Squamous Epithelial / LPF NONE SEEN NONE SEEN   Amorphous Crystal PRESENT   Pregnancy, urine POC     Status: Abnormal   Collection Time: 06/26/17  1:42 AM  Result Value Ref Range   Preg Test, Ur POSITIVE (A) NEGATIVE  CBC     Status: Abnormal   Collection Time: 06/26/17  1:56 AM  Result Value Ref Range   WBC 9.4 4.0 - 10.5 K/uL   RBC 4.10 3.87 - 5.11 MIL/uL   Hemoglobin 11.2 (L) 12.0 - 15.0 g/dL   HCT 65.0 (L) 35.4 - 65.6 %   MCV 83.4 78.0 - 100.0 fL   MCH 27.3 26.0 - 34.0 pg   MCHC 32.7 30.0 - 36.0 g/dL   RDW 81.2 (H) 75.1 - 70.0 %   Platelets 219 150 - 400 K/uL  hCG, quantitative, pregnancy     Status: Abnormal   Collection Time: 06/26/17  1:56 AM  Result Value Ref Range   hCG, Beta Chain, Quant, S 34 (H) <5 mIU/mL  ABO/Rh     Status: None (Preliminary result)   Collection Time: 06/26/17  1:56 AM  Result Value Ref Range   ABO/RH(D) A POS   Wet prep, genital     Status: Abnormal   Collection Time: 06/26/17  2:30 AM  Result Value Ref Range   Yeast Wet Prep HPF POC NONE SEEN NONE SEEN   Trich, Wet Prep NONE SEEN NONE SEEN   Clue Cells Wet Prep HPF POC PRESENT (A) NONE SEEN   WBC, Wet Prep HPF POC FEW (A) NONE SEEN   Sperm NONE SEEN    MAU Course  Procedures  MDM Labs ordered and reviewed. No evidence of UTI. Probable miscarriage and quant is likely trending down (since had positive test 2 weeks  ago). Will follow quant in 48 hrs. Stable for discharge home.  Assessment and Plan   1. Vaginal bleeding in pregnancy   2. Abdominal pain affecting pregnancy   3. Blood type, Rh positive    Discharge home Follow up in [facility] in 2 days for  quant HCG SAB/ectopic return precautions  Allergies as of 06/26/2017      Reactions   Acetaminophen-codeine Itching, Swelling   Rash, vomiting if takes on empty stomach      Medication List    STOP taking these medications   doxylamine (Sleep) 25 MG tablet Commonly known as:  UNISOM   ibuprofen 800 MG tablet Commonly known as:  ADVIL,MOTRIN   nitrofurantoin (macrocrystal-monohydrate) 100 MG capsule Commonly known as:  MACROBID   phenazopyridine 200 MG tablet Commonly known as:  PYRIDIUM   vitamin B-6 25 MG tablet Commonly known as:  pyridOXINE     TAKE these medications   [facility] PRENATAL MULTI+DHA 27-0.8-250 MG Caps Take 1 tablet by mouth daily.   TYLENOL 500 MG tablet Generic drug:  acetaminophen Take 500 mg by mouth once as needed. For pain      Donette Larry, CNM 06/26/2017, 2:16 AM

## 2017-06-26 NOTE — MAU Note (Addendum)
Pain in lower abd for about an hour. Some pink vag d/c. LMP first of June. Pain worse with walking

## 2017-06-26 NOTE — Discharge Instructions (Signed)

## 2017-06-28 ENCOUNTER — Other Ambulatory Visit: Payer: Self-pay

## 2017-06-28 ENCOUNTER — Telehealth: Payer: Self-pay | Admitting: General Practice

## 2017-06-28 LAB — GC/CHLAMYDIA PROBE AMP (~~LOC~~) NOT AT ARMC
Chlamydia: NEGATIVE
Neisseria Gonorrhea: NEGATIVE

## 2017-06-28 NOTE — Telephone Encounter (Signed)
Patient no showed for stat bhcg today. Called patient, no answer- left message stating we are calling regarding a missed an appt, please give [place] a call back to reschedule. Will send letter

## 2017-06-30 ENCOUNTER — Telehealth: Payer: Self-pay | Admitting: General Practice

## 2017-06-30 NOTE — Telephone Encounter (Signed)
Patient called into front office wanting to make an appt for Tuesday or Wednesday of next week for bhcg level. Patient reports difficulty with transportation. Told her it's preferred she come in sooner than that. Patient states she will try to come tomorrow morning. Patient had no questions

## 2017-07-01 ENCOUNTER — Ambulatory Visit: Payer: Self-pay

## 2017-07-01 ENCOUNTER — Telehealth: Payer: Self-pay | Admitting: *Deleted

## 2017-07-01 NOTE — Telephone Encounter (Signed)
Pt no showed for stat hcg. Called patient and heard message that voicemail is full.

## 2017-07-20 ENCOUNTER — Emergency Department
Admission: EM | Admit: 2017-07-20 | Discharge: 2017-07-20 | Disposition: A | Discharge status: Home / Self Care | Payer: Self-pay | Attending: Emergency Medicine | Admitting: Emergency Medicine

## 2017-07-20 DIAGNOSIS — Z79899 Other long term (current) drug therapy: Secondary | ICD-10-CM | POA: Insufficient documentation

## 2017-07-20 DIAGNOSIS — J45909 Unspecified asthma, uncomplicated: Secondary | ICD-10-CM | POA: Insufficient documentation

## 2017-07-20 DIAGNOSIS — F17210 Nicotine dependence, cigarettes, uncomplicated: Secondary | ICD-10-CM | POA: Insufficient documentation

## 2017-07-20 DIAGNOSIS — B9789 Other viral agents as the cause of diseases classified elsewhere: Secondary | ICD-10-CM

## 2017-07-20 DIAGNOSIS — J069 Acute upper respiratory infection, unspecified: Secondary | ICD-10-CM | POA: Insufficient documentation

## 2017-07-20 DIAGNOSIS — R05 Cough: Secondary | ICD-10-CM | POA: Insufficient documentation

## 2017-07-20 HISTORY — DX: Unspecified asthma, uncomplicated: J45.909

## 2017-07-20 MED ORDER — PSEUDOEPH-BROMPHEN-DM 30-2-10 MG/5ML PO SYRP: 5.0000 mL | ORAL_SOLUTION | Freq: Four times a day (QID) | ORAL | 0 refills | Status: DC | PRN

## 2017-07-20 NOTE — ED Provider Notes (Signed)
[facility] Emergency Department Provider Note   ____________________________________________   First MD Initiated Contact with Patient 07/20/17 [phone]     (approximate)  I have reviewed the triage vital signs and the nursing notes.   HISTORY  Chief Complaint URI    HPI Laurie Richards is a 21 y.o. female patient complaining of cough and chest congestion for one week. Patient say coughing is her asthma. Patient stated no relief using an inhaler at home. He denies fever/chills, nausea, vomiting, diarrhea. Patient rates her pain discomfort as 8/10. Patient described complete pain as "achy".   Past Medical History:  Diagnosis Date  . Asthma   . Irregular heartbeat     There are no active problems to display for this patient.   Past Surgical History:  Procedure Laterality Date  . APPENDECTOMY    . MOUTH SURGERY      Prior to Admission medications   Medication Sig Start Date End Date Taking? Authorizing Provider  acetaminophen (TYLENOL) 500 MG tablet Take 500 mg by mouth once as needed. For pain     [provider]  brompheniramine-pseudoephedrine-DM 30-2-10 MG/5ML syrup Take 5 mLs by mouth 4 (four) times daily as needed. 07/20/17   Joni Reining, PA-C  Prenatal MV-Min-Fe Fum-FA-DHA ([employer] PRENATAL MULTI+DHA) 27-0.8-250 MG CAPS Take 1 tablet by mouth daily. 04/30/17   Shaune Pollack, MD    Allergies Acetaminophen-codeine  Family History  Problem Relation Age of Onset  . Heart failure Mother   . Diabetes Other     Social History Social History  Substance Use Topics  . Smoking status: Current Every Day Smoker    Packs/day: 0.50    Types: Cigarettes  . Smokeless tobacco: Never Used  . Alcohol use Yes    Review of Systems Constitutional: No fever/chills Eyes: No visual changes. ENT: Nasal congestion or sore throat Cardiovascular: Denies chest pain. Respiratory: Cough and congestion Gastrointestinal: No abdominal pain.  No  nausea, no vomiting.  No diarrhea.  No constipation. Genitourinary: Negative for dysuria. Musculoskeletal: Negative for back pain. Skin: Negative for rash. Neurological: Negative for headaches, focal weakness or numbness. Allergic/Immunilogical: See medication list ____________________________________________   PHYSICAL EXAM:  VITAL SIGNS: ED Triage Vitals  Enc Vitals Group     BP 07/20/17 0905 116/69     Pulse Rate 07/20/17 0857 91     Resp 07/20/17 0905 14     Temp 07/20/17 0905 98.1 F (36.7 C)     Temp Source 07/20/17 0905 Oral     SpO2 07/20/17 0857 100 %     Weight 07/20/17 0905 125 lb (56.7 kg)     Height 07/20/17 0905 5\' 4"  (1.626 m)     Head Circumference --      Peak Flow --      Pain Score 07/20/17 0905 8     Pain Loc --      Pain Edu? --      Excl. in GC? --     Constitutional: Alert and oriented. Well appearing and in no acute distress. Nose:Edematous nasal terms clear rhinorrhea. Mouth/Throat: Mucous membranes are moist.  Oropharynx non-erythematous. Neck: No stridor.  No cervical spine tenderness to palpation. Hematological/Lymphatic/Immunilogical: No cervical lymphadenopathy. Cardiovascular: Normal rate, regular rhythm. Grossly normal heart sounds.  Good peripheral circulation. Respiratory: Normal respiratory effort.  No retractions. Lungs CTAB. Neurologic:  Normal speech and language. No gross focal neurologic deficits are appreciated. No gait instability. Skin:  Skin is warm, dry and intact. No  rash noted. Psychiatric: Mood and affect are normal. Speech and behavior are normal.  ____________________________________________   LABS (all labs ordered are listed, but only abnormal results are displayed)  Labs Reviewed - No data to display ____________________________________________  EKG   ____________________________________________  RADIOLOGY  No results found.  ____________________________________________   PROCEDURES  Procedure(s)  performed: None  Procedures  Critical Care performed: No  ____________________________________________   INITIAL IMPRESSION / ASSESSMENT AND PLAN / ED COURSE  Pertinent labs & imaging results that were available during my care of the patient were reviewed by me and considered in my medical decision making (see chart for details).  Upper rest or infection. Patient given discharge Instructions. Patient given a work note. Patient is taking medications directed. Patient has follow-up with [facility] condition persists.      ____________________________________________   FINAL CLINICAL IMPRESSION(S) / ED DIAGNOSES  Final diagnoses:  Viral URI with cough      NEW MEDICATIONS STARTED DURING THIS VISIT:  New Prescriptions   BROMPHENIRAMINE-PSEUDOEPHEDRINE-DM 30-2-10 MG/5ML SYRUP    Take 5 mLs by mouth 4 (four) times daily as needed.     Note:  This document was prepared using Dragon voice recognition software and may include unintentional dictation errors.    Joni Reining, PA-C 07/20/17 1610    Jene Every, MD 07/20/17 [phone]

## 2017-07-20 NOTE — ED Triage Notes (Signed)
States coughing and asthma attack, pt states no relief with inhaler at home, cough present, 100% on RA, states hx of asthma

## 2017-07-20 NOTE — ED Triage Notes (Signed)
Pt c/o cough with congestion for the past week.. Pt is in NAD on arrival..

## 2017-07-25 ENCOUNTER — Emergency Department: Payer: Medicaid Other

## 2017-07-25 ENCOUNTER — Emergency Department
Admission: EM | Admit: 2017-07-25 | Discharge: 2017-07-26 | Disposition: A | Discharge status: Home / Self Care | Payer: Medicaid Other | Attending: Emergency Medicine | Admitting: Emergency Medicine

## 2017-07-25 DIAGNOSIS — R1031 Right lower quadrant pain: Secondary | ICD-10-CM

## 2017-07-25 DIAGNOSIS — N390 Urinary tract infection, site not specified: Secondary | ICD-10-CM

## 2017-07-25 DIAGNOSIS — Z79899 Other long term (current) drug therapy: Secondary | ICD-10-CM | POA: Insufficient documentation

## 2017-07-25 DIAGNOSIS — F17210 Nicotine dependence, cigarettes, uncomplicated: Secondary | ICD-10-CM | POA: Insufficient documentation

## 2017-07-25 DIAGNOSIS — J45909 Unspecified asthma, uncomplicated: Secondary | ICD-10-CM | POA: Insufficient documentation

## 2017-07-25 LAB — CBC WITH DIFFERENTIAL/PLATELET
BASOS ABS: 0 10*3/uL (ref 0–0.1)
BASOS PCT: 0 %
Eosinophils Absolute: 0.2 10*3/uL (ref 0–0.7)
Eosinophils Relative: 3 %
HEMATOCRIT: 39.8 % (ref 35.0–47.0)
Hemoglobin: 13.7 g/dL (ref 12.0–16.0)
Lymphocytes Relative: 30 %
Lymphs Abs: 2 10*3/uL (ref 1.0–3.6)
MCH: 27.9 pg (ref 26.0–34.0)
MCHC: 34.3 g/dL (ref 32.0–36.0)
MCV: 81.3 fL (ref 80.0–100.0)
MONO ABS: 0.6 10*3/uL (ref 0.2–0.9)
Monocytes Relative: 8 %
NEUTROS ABS: 4 10*3/uL (ref 1.4–6.5)
Neutrophils Relative %: 59 %
PLATELETS: 248 10*3/uL (ref 150–440)
RBC: 4.89 MIL/uL (ref 3.80–5.20)
RDW: 15 % — AB (ref 11.5–14.5)
WBC: 6.8 10*3/uL (ref 3.6–11.0)

## 2017-07-25 LAB — COMPREHENSIVE METABOLIC PANEL
ALBUMIN: 4 g/dL (ref 3.5–5.0)
ALT: 11 U/L — ABNORMAL LOW (ref 14–54)
AST: 19 U/L (ref 15–41)
Alkaline Phosphatase: 70 U/L (ref 38–126)
Anion gap: 7 (ref 5–15)
BILIRUBIN TOTAL: 0.4 mg/dL (ref 0.3–1.2)
BUN: 11 mg/dL (ref 6–20)
CHLORIDE: 105 mmol/L (ref 101–111)
CO2: 26 mmol/L (ref 22–32)
Calcium: 8.7 mg/dL — ABNORMAL LOW (ref 8.9–10.3)
Creatinine, Ser: 0.6 mg/dL (ref 0.44–1.00)
GFR calc Af Amer: >60 mL/min (ref >60)
GFR calc non Af Amer: >60 mL/min (ref >60)
GLUCOSE: 107 mg/dL — AB (ref 65–99)
POTASSIUM: 3.6 mmol/L (ref 3.5–5.1)
Sodium: 138 mmol/L (ref 135–145)
Total Protein: 7.2 g/dL (ref 6.5–8.1)

## 2017-07-25 LAB — URINALYSIS, COMPLETE (UACMP) WITH MICROSCOPIC
BILIRUBIN URINE: NEGATIVE
Glucose, UA: NEGATIVE mg/dL
HGB URINE DIPSTICK: NEGATIVE
Ketones, ur: NEGATIVE mg/dL
LEUKOCYTES UA: NEGATIVE
Nitrite: POSITIVE — AB
PROTEIN: NEGATIVE mg/dL
Specific Gravity, Urine: 1.018 (ref 1.005–1.030)
pH: 7 (ref 5.0–8.0)

## 2017-07-25 LAB — LIPASE, BLOOD: Lipase: 30 U/L (ref 11–51)

## 2017-07-25 MED ORDER — ONDANSETRON HCL 4 MG/2ML IJ SOLN
4.0000 mg | Freq: Once | INTRAMUSCULAR | Status: AC
  Administered 2017-07-25: 4 mg via INTRAVENOUS

## 2017-07-25 MED ORDER — KETOROLAC TROMETHAMINE 30 MG/ML IJ SOLN
INTRAMUSCULAR | Status: AC
  Administered 2017-07-25: 15 mg via INTRAVENOUS
  Filled 2017-07-25: qty 1

## 2017-07-25 MED ORDER — ONDANSETRON HCL 4 MG/2ML IJ SOLN
INTRAMUSCULAR | Status: AC
  Administered 2017-07-25: 4 mg via INTRAVENOUS
  Filled 2017-07-25: qty 2

## 2017-07-25 MED ORDER — KETOROLAC TROMETHAMINE 30 MG/ML IJ SOLN
15.0000 mg | Freq: Once | INTRAMUSCULAR | Status: AC
  Administered 2017-07-25: 15 mg via INTRAVENOUS

## 2017-07-25 MED ORDER — CEFTRIAXONE SODIUM IN DEXTROSE 20 MG/ML IV SOLN
1.0000 g | Freq: Once | INTRAVENOUS | Status: AC
Start: 2017-07-25 — End: 2017-07-25
  Administered 2017-07-25: 1 g via INTRAVENOUS
  Filled 2017-07-25: qty 50

## 2017-07-25 NOTE — ED Provider Notes (Signed)
[facility] Emergency Department Provider Note  ____________________________________________  Time seen: Approximately 10:23 PM  I have reviewed the triage vital signs and the nursing notes.   HISTORY  Chief Complaint Abdominal Pain   HPI Laurie Richards is a 21 y.o. female who presents for evaluation of right lower quadrant. Patient tells me that 3 months ago she was in [facility] and had a ruptured appendix which was removed. She was doing well until this afternoon when she started having sudden onset of right lower quadrant abdominal pain, initially mild but now is severe, worse with any movement or coughing, constant and nonradiating. The pain is sharp. She denies constipation or diarrhea, vaginal discharge or dysuria. She does have nausea and 1 episode of nonbloody nonbilious emesis. She reports that the pain was similar to her ruptured appendix. No fevers but has had chills.  Past Medical History:  Diagnosis Date  . Asthma   . Irregular heartbeat     There are no active problems to display for this patient.   Past Surgical History:  Procedure Laterality Date  . APPENDECTOMY    . MOUTH SURGERY      Prior to Admission medications   Medication Sig Start Date End Date Taking? Authorizing Provider  acetaminophen (TYLENOL) 500 MG tablet Take 500 mg by mouth once as needed. For pain     [provider]  brompheniramine-pseudoephedrine-DM 30-2-10 MG/5ML syrup Take 5 mLs by mouth 4 (four) times daily as needed. 07/20/17   Joni Reining, PA-C  Prenatal MV-Min-Fe Fum-FA-DHA ([employer] PRENATAL MULTI+DHA) 27-0.8-250 MG CAPS Take 1 tablet by mouth daily. 04/30/17   Shaune Pollack, MD    Allergies Acetaminophen-codeine  Family History  Problem Relation Age of Onset  . Heart failure Mother   . Diabetes Other     Social History Social History  Substance Use Topics  . Smoking status: Current Every Day Smoker    Packs/day: 0.50   Types: Cigarettes  . Smokeless tobacco: Never Used  . Alcohol use Yes    Review of Systems  Constitutional: Negative for fever. + chills Eyes: Negative for visual changes. ENT: Negative for sore throat. Neck: No neck pain  Cardiovascular: Negative for chest pain. Respiratory: Negative for shortness of breath. Gastrointestinal: + RLQ abdominal pain, nausea, and vomiting. No diarrhea. Genitourinary: Negative for dysuria. Musculoskeletal: Negative for back pain. Skin: Negative for rash. Neurological: Negative for headaches, weakness or numbness. Psych: No SI or HI  ____________________________________________   PHYSICAL EXAM:  VITAL SIGNS: ED Triage Vitals [07/25/17 2132]  Enc Vitals Group     BP 116/83     Pulse Rate 97     Resp 18     Temp 98.6 F (37 C)     Temp Source Oral     SpO2 98 %     Weight      Height      Head Circumference      Peak Flow      Pain Score 9     Pain Loc      Pain Edu?      Excl. in GC?     Constitutional: Alert and oriented. Well appearing and in no apparent distress. HEENT:      Head: Normocephalic and atraumatic.         Eyes: Conjunctivae are normal. Sclera is non-icteric.       Mouth/Throat: Mucous membranes are moist.       Neck: Supple with no  signs of meningismus. Cardiovascular: Regular rate and rhythm. No murmurs, gallops, or rubs. 2+ symmetrical distal pulses are present in all extremities. No JVD. Respiratory: Normal respiratory effort. Lungs are clear to auscultation bilaterally. No wheezes, crackles, or rhonchi.  Gastrointestinal: Soft, Tender to palpation on the right lower quadrant with localized guarding, and non distended with positive bowel sounds. No rebound or guarding. Genitourinary: No CVA tenderness. Musculoskeletal: Nontender with normal range of motion in all extremities. No edema, cyanosis, or erythema of extremities. Neurologic: Normal speech and language. Face is symmetric. Moving all extremities. No gross  focal neurologic deficits are appreciated. Skin: Skin is warm, dry and intact. No rash noted. Psychiatric: Mood and affect are normal. Speech and behavior are normal.  ____________________________________________   LABS (all labs ordered are listed, but only abnormal results are displayed)  Labs Reviewed  CBC WITH DIFFERENTIAL/PLATELET - Abnormal; Notable for the following:       Result Value   RDW 15.0 (*)    All other components within normal limits  URINALYSIS, COMPLETE (UACMP) WITH MICROSCOPIC - Abnormal; Notable for the following:    Color, Urine YELLOW (*)    APPearance HAZY (*)    Nitrite POSITIVE (*)    Bacteria, UA MANY (*)    Squamous Epithelial / LPF 0-5 (*)    All other components within normal limits  COMPREHENSIVE METABOLIC PANEL  LIPASE, BLOOD   ____________________________________________  EKG  none ____________________________________________  RADIOLOGY  TVUS: PND  ____________________________________________   PROCEDURES  Procedure(s) performed: None Procedures Critical Care performed:  None ____________________________________________   INITIAL IMPRESSION / ASSESSMENT AND PLAN / ED COURSE  21 y.o. female who presents for evaluation of sudden onset of right lower quadrant associated with chills, nausea, and vomiting. Patient s/p appendectomy of a ruptured appendix 3 months ago. She is well appearing, no distress, has normal vital signs, she is very tender on the right lower quadrant with localized guarding. Differential diagnoses including intra-abdominal abscess versus ovarian torsion versus ovarian cyst versus ectopic pregnancy versus diverticulitis. We'll start with a transvaginal ultrasound, check lab work and pregnancy test. We'll treat with Toradol, Zofran, and fluids.  Clinical Course as of Jul 26 1729  Wynelle Link Jul 25, 2017  2323 Pending transvaginal ultrasound. If that's negative for possible etiology of patient's right lower quadrant  recommended CT abdomen and pelvis to rule out intra-abdominal abscess versus kidney stone especially since patient has a urinary tract infection. Patient was given Rocephin. Care transferred to Dr. Lenard Lance  [CV]    Clinical Course User Index [CV] Don Perking [place], MD    Pertinent labs & imaging results that were available during my care of the patient were reviewed by me and considered in my medical decision making (see chart for details).    ____________________________________________   FINAL CLINICAL IMPRESSION(S) / ED DIAGNOSES  Final diagnoses:  RLQ abdominal pain  RLQ abdominal pain      NEW MEDICATIONS STARTED DURING THIS VISIT:  New Prescriptions   No medications on file     Note:  This document was prepared using Dragon voice recognition software and may include unintentional dictation errors.    Don Perking, [place], MD 07/26/17 [phone]

## 2017-07-25 NOTE — ED Triage Notes (Signed)
Pt states that she is having rlq pain, states that it started approx an hour ago, pt states that she had her appendix removed about 3 mos ago and the pain reminds her of that, pt also states hx of ovarian cyst, pt states the pain caused her to vomit

## 2017-07-26 ENCOUNTER — Emergency Department: Payer: Medicaid Other

## 2017-07-26 ENCOUNTER — Encounter: Payer: Self-pay | Admitting: Radiology

## 2017-07-26 LAB — POCT PREGNANCY, URINE: PREG TEST UR: NEGATIVE

## 2017-07-26 MED ORDER — CEPHALEXIN 500 MG PO CAPS: 500.0000 mg | ORAL_CAPSULE | Freq: Three times a day (TID) | ORAL | 0 refills | Status: DC

## 2017-07-26 MED ORDER — IOPAMIDOL (ISOVUE-300) INJECTION 61%
100.0000 mL | Freq: Once | INTRAVENOUS | Status: AC | PRN
  Administered 2017-07-26: 100 mL via INTRAVENOUS

## 2017-07-26 MED ORDER — KETOROLAC TROMETHAMINE 30 MG/ML IJ SOLN
INTRAMUSCULAR | Status: AC
  Administered 2017-07-26: 15 mg via INTRAVENOUS
  Filled 2017-07-26: qty 1

## 2017-07-26 MED ORDER — KETOROLAC TROMETHAMINE 10 MG PO TABS: 10.0000 mg | ORAL_TABLET | Freq: Four times a day (QID) | ORAL | 0 refills | Status: DC | PRN

## 2017-07-26 MED ORDER — KETOROLAC TROMETHAMINE 30 MG/ML IJ SOLN
15.0000 mg | Freq: Once | INTRAMUSCULAR | Status: AC
  Administered 2017-07-26: 15 mg via INTRAVENOUS

## 2017-07-26 MED ORDER — IOPAMIDOL (ISOVUE-300) INJECTION 61%
30.0000 mL | Freq: Once | INTRAVENOUS | Status: AC | PRN
  Administered 2017-07-26: 30 mL via ORAL

## 2017-07-26 NOTE — ED Notes (Signed)
Patient transported to CT 

## 2017-07-26 NOTE — ED Provider Notes (Signed)
-----------------------------------------   2:24 AM on 07/26/2017 -----------------------------------------  CT scan is negative. Overall the patient appears well. Exam/workup most consistent with urinary tract infection. Patient has received Rocephin in the emergency department. We will discharge with antibiotics and PCP follow-up. Patient agreeable to plan.   Minna Antis, MD 07/26/17 [phone]

## 2017-07-26 NOTE — ED Notes (Signed)
POC test negative

## 2017-07-28 LAB — URINE CULTURE: Culture: >=100000 — AB

## 2017-08-24 ENCOUNTER — Emergency Department (HOSPITAL_COMMUNITY)
Admission: EM | Admit: 2017-08-24 | Discharge: 2017-08-24 | Disposition: A | Discharge status: Home / Self Care | Payer: Self-pay | Attending: Emergency Medicine | Admitting: Emergency Medicine

## 2017-08-24 ENCOUNTER — Emergency Department (HOSPITAL_COMMUNITY): Payer: Self-pay

## 2017-08-24 ENCOUNTER — Encounter (HOSPITAL_COMMUNITY): Payer: Self-pay | Admitting: Emergency Medicine

## 2017-08-24 DIAGNOSIS — Y9301 Activity, walking, marching and hiking: Secondary | ICD-10-CM | POA: Insufficient documentation

## 2017-08-24 DIAGNOSIS — Y929 Unspecified place or not applicable: Secondary | ICD-10-CM | POA: Insufficient documentation

## 2017-08-24 DIAGNOSIS — S93601A Unspecified sprain of right foot, initial encounter: Secondary | ICD-10-CM | POA: Insufficient documentation

## 2017-08-24 DIAGNOSIS — Y999 Unspecified external cause status: Secondary | ICD-10-CM | POA: Insufficient documentation

## 2017-08-24 DIAGNOSIS — F17210 Nicotine dependence, cigarettes, uncomplicated: Secondary | ICD-10-CM | POA: Insufficient documentation

## 2017-08-24 DIAGNOSIS — J45909 Unspecified asthma, uncomplicated: Secondary | ICD-10-CM | POA: Insufficient documentation

## 2017-08-24 DIAGNOSIS — Z79899 Other long term (current) drug therapy: Secondary | ICD-10-CM | POA: Insufficient documentation

## 2017-08-24 DIAGNOSIS — W228XXA Striking against or struck by other objects, initial encounter: Secondary | ICD-10-CM | POA: Insufficient documentation

## 2017-08-24 MED ORDER — IBUPROFEN 400 MG PO TABS
600.0000 mg | ORAL_TABLET | Freq: Once | ORAL | Status: AC
  Administered 2017-08-24: 22:00:00 600 mg via ORAL
  Filled 2017-08-24: qty 1

## 2017-08-24 MED ORDER — OXYCODONE-ACETAMINOPHEN 5-325 MG PO TABS
1.0000 | ORAL_TABLET | Freq: Once | ORAL | Status: AC
  Administered 2017-08-24: 1 via ORAL
  Filled 2017-08-24: qty 1

## 2017-08-24 NOTE — ED Provider Notes (Signed)
[facility] EMERGENCY DEPARTMENT Provider Note   CSN: [id number] Arrival date & time: 08/24/17  1815     History   Chief Complaint Chief Complaint  Patient presents with  . Foot Pain    HPI Laurie Richards is a 21 y.o. female.  HPI 21 year old female presents to the ED with complaints of right foot pain after dropping 3 oxygen tanks on her right foot. This happened prior to arrival. Patient has been able to or he able to bear weight but with pain. She reports associated swelling and bruising to the top of her foot. Pain is worse with palpation and ambulation. She has not taken anything for pain prior to arrival. Nothing makes better. The patient denies any associated ankle pain, knee pain, paresthesias, weakness. No wound. Past Medical History:  Diagnosis Date  . Asthma   . Irregular heartbeat     There are no active problems to display for this patient.   Past Surgical History:  Procedure Laterality Date  . APPENDECTOMY    . MOUTH SURGERY      OB History    Gravida Para Term Preterm AB Living   4       2 0   SAB TAB Ectopic Multiple Live Births   2               Home Medications    Prior to Admission medications   Medication Sig Start Date End Date Taking? Authorizing Provider  acetaminophen (TYLENOL) 500 MG tablet Take 500 mg by mouth once as needed. For pain     [provider]  brompheniramine-pseudoephedrine-DM 30-2-10 MG/5ML syrup Take 5 mLs by mouth 4 (four) times daily as needed. 07/20/17   Joni Reining, PA-C  cephALEXin (KEFLEX) 500 MG capsule Take 1 capsule (500 mg total) by mouth 3 (three) times daily. 07/26/17   Minna Antis, MD  ketorolac (TORADOL) 10 MG tablet Take 1 tablet (10 mg total) by mouth every 6 (six) hours as needed. 07/26/17   Minna Antis, MD  Prenatal MV-Min-Fe Fum-FA-DHA ([employer] PRENATAL MULTI+DHA) 27-0.8-250 MG CAPS Take 1 tablet by mouth daily. 04/30/17   Shaune Pollack, MD    Family  History Family History  Problem Relation Age of Onset  . Heart failure Mother   . Diabetes Other     Social History Social History  Substance Use Topics  . Smoking status: Current Every Day Smoker    Packs/day: 0.50    Types: Cigarettes  . Smokeless tobacco: Never Used  . Alcohol use Yes     Allergies   Acetaminophen-codeine   Review of Systems Review of Systems  Musculoskeletal: Positive for arthralgias, gait problem, joint swelling and myalgias.  Skin: Positive for color change.  Neurological: Negative for weakness and numbness.     Physical Exam Updated Vital Signs BP 95/64 (BP Location: Right Arm)   Pulse 87   Temp 98.3 F (36.8 C) (Oral)   Resp 16   Ht  (1.626 m)   Wt 54.4 kg (120 lb)   LMP 08/10/2017 (Exact Date)   SpO2 99%   BMI 20.60 kg/m   Physical Exam  Constitutional: She appears well-developed and well-nourished. No distress.  HENT:  Head: Normocephalic and atraumatic.  Eyes: Right eye exhibits no discharge. Left eye exhibits no discharge. No scleral icterus.  Neck: Normal range of motion.  Pulmonary/Chest: No respiratory distress.  Musculoskeletal:       Right foot: There is decreased range of motion (  due to pain), tenderness and bony tenderness. There is no swelling, normal capillary refill, no crepitus, no deformity and no laceration.       Feet:  Ecchymosis noted over the right midfoot. Tender to palpation. Full range motion of the phalanges. Limited range of motion the right ankle due to the pain of the foot. No tenderness to palpation of the lateral medial malleolus. DP pulses are 2+ bilaterally. Sensation is intact. Cap refill is normal.  Neurological: She is alert.  Skin: No pallor.  Psychiatric: Her behavior is normal. Judgment and thought content normal.  Nursing note and vitals reviewed.    ED Treatments / Results  Labs (all labs ordered are listed, but only abnormal results are displayed) Labs Reviewed - No data to  display  EKG  EKG Interpretation None       Radiology Dg Ankle Complete Right  Result Date: 08/24/2017 CLINICAL DATA:  Dropped on oxygen tank onto the right foot. Pain, bruising, and swelling. EXAM: RIGHT ANKLE - COMPLETE 3+ VIEW; RIGHT FOOT COMPLETE - 3+ VIEW COMPARISON:  Right ankle 12/22/2010 FINDINGS: Three views of the right foot and three views of the right ankle are obtained. There is no evidence of acute fracture or dislocation. No focal bone lesion or bone destruction. Bone cortex appears intact. Soft tissues are unremarkable. IMPRESSION: No evidence of acute fracture or dislocation involving the right foot or the right ankle. Electronically Signed   By: Burman Nieves M.D.   On: 08/24/2017 21:14   Dg Foot Complete Right  Result Date: 08/24/2017 CLINICAL DATA:  Dropped on oxygen tank onto the right foot. Pain, bruising, and swelling. EXAM: RIGHT ANKLE - COMPLETE 3+ VIEW; RIGHT FOOT COMPLETE - 3+ VIEW COMPARISON:  Right ankle 12/22/2010 FINDINGS: Three views of the right foot and three views of the right ankle are obtained. There is no evidence of acute fracture or dislocation. No focal bone lesion or bone destruction. Bone cortex appears intact. Soft tissues are unremarkable. IMPRESSION: No evidence of acute fracture or dislocation involving the right foot or the right ankle. Electronically Signed   By: Burman Nieves M.D.   On: 08/24/2017 21:14    Procedures Procedures (including critical care time)  Medications Ordered in ED Medications  ibuprofen (ADVIL,MOTRIN) tablet 600 mg (not administered)  oxyCODONE-acetaminophen (PERCOCET/ROXICET) 5-325 MG per tablet 1 tablet (1 tablet Oral Given 08/24/17 2044)     Initial Impression / Assessment and Plan / ED Course  I have reviewed the triage vital signs and the nursing notes.  Pertinent labs & imaging results that were available during my care of the patient were reviewed by me and considered in my medical decision making  (see chart for details).     Patient presents with right foot pain after dropping an oxygen take on her right foot. Ecchymosis and swelling noted. Patient is neurovascularly intact.Patient X-Ray negative for obvious fracture or dislocation. Pain managed in ED. Pt advised to follow up with orthopedics if symptoms persist for possibility of missed fracture diagnosis. Patient given postop shoe and crutches while in ED, conservative therapy recommended and discussed. Patient will be dc home & is agreeable with above plan.   Final Clinical Impressions(s) / ED Diagnoses   Final diagnoses:  Sprain of right foot, initial encounter    New Prescriptions New Prescriptions   No medications on file     Wallace Keller 08/24/17 2146    Linwood Dibbles, MD 08/24/17 [phone]

## 2017-08-24 NOTE — ED Triage Notes (Signed)
Patient dropped 3 oxygen tanks on her right foot.  Patient having pain with walking.

## 2017-08-24 NOTE — Discharge Instructions (Signed)
Your x-ray showed no signs of fracture. Wear the postop shoe for comfort. Use the crutches for nonweightbearing for 1-2 days then advance her weightbearing as tolerated.  Please rest, ice, compress and elevated the affected body part to help with swelling and pain.  Motrin and Tylenol for pain. Have given you follow with orthopedic doctor if her symptoms are not improving for follow-up in 5-6 days for repeat imaging if symptoms are not improving.  COLD THERAPY DIRECTIONS:  Ice or gel packs can be used to reduce both pain and swelling. Ice is the most helpful within the first 24 to 48 hours after an injury or flareup from overusing a muscle or joint.  Ice is effective, has very few side effects, and is safe for most people to use.   If you expose your skin to cold temperatures for too long or without the proper protection, you can damage your skin or nerves. Watch for signs of skin damage due to cold.   HOME CARE INSTRUCTIONS  Follow these tips to use ice and cold packs safely.  Place a dry or damp towel between the ice and skin. A damp towel will cool the skin more quickly, so you may need to shorten the time that the ice is used.  For a more rapid response, add gentle compression to the ice.  Ice for no more than 10 to 20 minutes at a time. The bonier the area you are icing, the less time it will take to get the benefits of ice.  Check your skin after 5 minutes to make sure there are no signs of a poor response to cold or skin damage.  Rest 20 minutes or more in between uses.  Once your skin is numb, you can end your treatment. You can test numbness by very lightly touching your skin. The touch should be so light that you do not see the skin dimple from the pressure of your fingertip. When using ice, most people will feel these normal sensations in this order: cold, burning, aching, and numbness.

## 2017-08-24 NOTE — Progress Notes (Signed)
Orthopedic Tech Progress Note Patient Details:  Laurie Richards [birth year] [id number]  Ortho Devices Type of Ortho Device: Postop shoe/boot, Crutches Ortho Device/Splint Location: RLE Ortho Device/Splint Interventions: Ordered, Application, Adjustment   [name] 08/24/2017, 9:53 PM

## 2017-09-09 DIAGNOSIS — K047 Periapical abscess without sinus: Secondary | ICD-10-CM

## 2017-09-09 HISTORY — DX: Periapical abscess without sinus: K04.7

## 2017-09-22 ENCOUNTER — Inpatient Hospital Stay
Admission: EM | Admit: 2017-09-22 | Discharge: 2017-09-23 | DRG: 310 | Disposition: A | Discharge status: Home / Self Care | Payer: Self-pay | Attending: Internal Medicine | Admitting: Internal Medicine

## 2017-09-22 ENCOUNTER — Other Ambulatory Visit: Payer: Self-pay

## 2017-09-22 ENCOUNTER — Emergency Department: Payer: Self-pay

## 2017-09-22 DIAGNOSIS — E039 Hypothyroidism, unspecified: Secondary | ICD-10-CM | POA: Diagnosis present

## 2017-09-22 DIAGNOSIS — T486X5A Adverse effect of antiasthmatics, initial encounter: Secondary | ICD-10-CM | POA: Diagnosis present

## 2017-09-22 DIAGNOSIS — Z79899 Other long term (current) drug therapy: Secondary | ICD-10-CM

## 2017-09-22 DIAGNOSIS — Z8659 Personal history of other mental and behavioral disorders: Secondary | ICD-10-CM

## 2017-09-22 DIAGNOSIS — J45909 Unspecified asthma, uncomplicated: Secondary | ICD-10-CM | POA: Diagnosis present

## 2017-09-22 DIAGNOSIS — Z885 Allergy status to narcotic agent status: Secondary | ICD-10-CM

## 2017-09-22 DIAGNOSIS — I480 Paroxysmal atrial fibrillation: Principal | ICD-10-CM | POA: Diagnosis present

## 2017-09-22 DIAGNOSIS — E0590 Thyrotoxicosis, unspecified without thyrotoxic crisis or storm: Secondary | ICD-10-CM

## 2017-09-22 DIAGNOSIS — E876 Hypokalemia: Secondary | ICD-10-CM | POA: Diagnosis present

## 2017-09-22 DIAGNOSIS — F1910 Other psychoactive substance abuse, uncomplicated: Secondary | ICD-10-CM | POA: Diagnosis present

## 2017-09-22 DIAGNOSIS — Z886 Allergy status to analgesic agent status: Secondary | ICD-10-CM

## 2017-09-22 DIAGNOSIS — F17210 Nicotine dependence, cigarettes, uncomplicated: Secondary | ICD-10-CM | POA: Diagnosis present

## 2017-09-22 DIAGNOSIS — R8290 Unspecified abnormal findings in urine: Secondary | ICD-10-CM | POA: Diagnosis present

## 2017-09-22 DIAGNOSIS — I4891 Unspecified atrial fibrillation: Secondary | ICD-10-CM | POA: Diagnosis present

## 2017-09-22 HISTORY — DX: Paroxysmal atrial fibrillation: I48.0

## 2017-09-22 HISTORY — DX: Periapical abscess without sinus: K04.7

## 2017-09-22 HISTORY — DX: Tobacco use: Z72.0

## 2017-09-22 HISTORY — DX: Cannabis abuse, uncomplicated: F12.10

## 2017-09-22 LAB — CBC WITH DIFFERENTIAL/PLATELET
BASOS ABS: 0 10*3/uL (ref 0–0.1)
Basophils Relative: 1 %
EOS PCT: 2 %
Eosinophils Absolute: 0.1 10*3/uL (ref 0–0.7)
HEMATOCRIT: 43.8 % (ref 35.0–47.0)
HEMOGLOBIN: 14.4 g/dL (ref 12.0–16.0)
LYMPHS PCT: 25 %
Lymphs Abs: 1.8 10*3/uL (ref 1.0–3.6)
MCH: 26.4 pg (ref 26.0–34.0)
MCHC: 32.9 g/dL (ref 32.0–36.0)
MCV: 80.2 fL (ref 80.0–100.0)
Monocytes Absolute: 0.5 10*3/uL (ref 0.2–0.9)
Monocytes Relative: 7 %
NEUTROS ABS: 4.8 10*3/uL (ref 1.4–6.5)
NEUTROS PCT: 65 %
PLATELETS: 279 10*3/uL (ref 150–440)
RBC: 5.46 MIL/uL — AB (ref 3.80–5.20)
RDW: 15.2 % — ABNORMAL HIGH (ref 11.5–14.5)
WBC: 7.3 10*3/uL (ref 3.6–11.0)

## 2017-09-22 LAB — URINE DRUG SCREEN, QUALITATIVE (ARMC ONLY)
AMPHETAMINES, UR SCREEN: NOT DETECTED
BARBITURATES, UR SCREEN: NOT DETECTED
Benzodiazepine, Ur Scrn: NOT DETECTED
COCAINE METABOLITE, UR ~~LOC~~: NOT DETECTED
Cannabinoid 50 Ng, Ur ~~LOC~~: POSITIVE — AB
MDMA (Ecstasy)Ur Screen: NOT DETECTED
METHADONE SCREEN, URINE: NOT DETECTED
Opiate, Ur Screen: POSITIVE — AB
Phencyclidine (PCP) Ur S: NOT DETECTED
TRICYCLIC, UR SCREEN: NOT DETECTED

## 2017-09-22 LAB — BASIC METABOLIC PANEL
ANION GAP: 10 (ref 5–15)
BUN: 10 mg/dL (ref 6–20)
CALCIUM: 9.1 mg/dL (ref 8.9–10.3)
CO2: 25 mmol/L (ref 22–32)
CREATININE: 0.76 mg/dL (ref 0.44–1.00)
Chloride: 103 mmol/L (ref 101–111)
GFR calc non Af Amer: >60 mL/min (ref >60)
Glucose, Bld: 102 mg/dL — ABNORMAL HIGH (ref 65–99)
Potassium: 3.3 mmol/L — ABNORMAL LOW (ref 3.5–5.1)
SODIUM: 138 mmol/L (ref 135–145)

## 2017-09-22 LAB — TROPONIN I

## 2017-09-22 LAB — HEPATIC FUNCTION PANEL
ALBUMIN: 3.9 g/dL (ref 3.5–5.0)
ALT: 10 U/L — ABNORMAL LOW (ref 14–54)
AST: 21 U/L (ref 15–41)
Alkaline Phosphatase: 63 U/L (ref 38–126)
Bilirubin, Direct: <0.1 mg/dL — ABNORMAL LOW (ref 0.1–0.5)
TOTAL PROTEIN: 7.1 g/dL (ref 6.5–8.1)
Total Bilirubin: 0.5 mg/dL (ref 0.3–1.2)

## 2017-09-22 LAB — T4, FREE: Free T4: 0.89 ng/dL (ref 0.61–1.12)

## 2017-09-22 LAB — TSH: TSH: 0.255 u[IU]/mL — ABNORMAL LOW (ref 0.350–4.500)

## 2017-09-22 LAB — HCG, QUANTITATIVE, PREGNANCY

## 2017-09-22 LAB — PREGNANCY, URINE: Preg Test, Ur: NEGATIVE

## 2017-09-22 MED ORDER — SODIUM CHLORIDE 0.9% FLUSH: 3.0000 mL | INTRAVENOUS | Status: DC | PRN

## 2017-09-22 MED ORDER — NITROGLYCERIN 0.4 MG SL SUBL
0.4000 mg | SUBLINGUAL_TABLET | SUBLINGUAL | Status: DC | PRN
  Administered 2017-09-22: 0.4 mg via SUBLINGUAL
  Filled 2017-09-22: qty 1

## 2017-09-22 MED ORDER — POTASSIUM CHLORIDE 20 MEQ PO PACK
40.0000 meq | PACK | Freq: Once | ORAL | Status: AC
  Administered 2017-09-22: 40 meq via ORAL
  Filled 2017-09-22: qty 2

## 2017-09-22 MED ORDER — DILTIAZEM HCL 60 MG PO TABS
60.0000 mg | ORAL_TABLET | Freq: Once | ORAL | Status: DC
  Filled 2017-09-22 (×3): qty 1

## 2017-09-22 MED ORDER — DILTIAZEM HCL 60 MG PO TABS: 60.0000 mg | ORAL_TABLET | Freq: Three times a day (TID) | ORAL | 0 refills | Status: DC

## 2017-09-22 MED ORDER — SODIUM CHLORIDE 0.9 % IV SOLN: 250.0000 mL | INTRAVENOUS | Status: DC | PRN

## 2017-09-22 MED ORDER — HYDROCODONE-ACETAMINOPHEN 5-325 MG PO TABS: 1.0000 | ORAL_TABLET | ORAL | Status: DC | PRN

## 2017-09-22 MED ORDER — ENOXAPARIN SODIUM 40 MG/0.4ML ~~LOC~~ SOLN
40.0000 mg | SUBCUTANEOUS | Status: DC
  Administered 2017-09-22: 40 mg via SUBCUTANEOUS
  Filled 2017-09-22: qty 0.4

## 2017-09-22 MED ORDER — DILTIAZEM HCL ER COATED BEADS 120 MG PO CP24
120.0000 mg | ORAL_CAPSULE | Freq: Every day | ORAL | Status: DC
  Administered 2017-09-23: 120 mg via ORAL
  Filled 2017-09-22 (×2): qty 1

## 2017-09-22 MED ORDER — DEXTROSE 5 % IV SOLN
1.0000 g | INTRAVENOUS | Status: DC
  Administered 2017-09-22: 1 g via INTRAVENOUS
  Filled 2017-09-22 (×3): qty 10

## 2017-09-22 MED ORDER — CEFTRIAXONE SODIUM IN DEXTROSE 20 MG/ML IV SOLN: 1.0000 g | INTRAVENOUS | Status: DC

## 2017-09-22 MED ORDER — ONDANSETRON HCL 4 MG PO TABS: 4.0000 mg | ORAL_TABLET | Freq: Four times a day (QID) | ORAL | Status: DC | PRN

## 2017-09-22 MED ORDER — MAGNESIUM SULFATE 4 GM/100ML IV SOLN
4.0000 g | Freq: Once | INTRAVENOUS | Status: AC
  Administered 2017-09-22: 4 g via INTRAVENOUS
  Filled 2017-09-22: qty 100

## 2017-09-22 MED ORDER — POLYETHYLENE GLYCOL 3350 17 G PO PACK: 17.0000 g | PACK | Freq: Every day | ORAL | Status: DC | PRN

## 2017-09-22 MED ORDER — DIGOXIN 0.25 MG/ML IJ SOLN
0.5000 mg | Freq: Once | INTRAMUSCULAR | Status: AC
  Administered 2017-09-22: 0.5 mg via INTRAVENOUS
  Filled 2017-09-22 (×3): qty 2

## 2017-09-22 MED ORDER — ONDANSETRON HCL 4 MG/2ML IJ SOLN: 4.0000 mg | Freq: Four times a day (QID) | INTRAMUSCULAR | Status: DC | PRN

## 2017-09-22 MED ORDER — SODIUM CHLORIDE 0.9% FLUSH
3.0000 mL | Freq: Two times a day (BID) | INTRAVENOUS | Status: DC
  Administered 2017-09-22 – 2017-09-23 (×2): 3 mL via INTRAVENOUS

## 2017-09-22 MED ORDER — NICOTINE 14 MG/24HR TD PT24
14.0000 mg | MEDICATED_PATCH | Freq: Every day | TRANSDERMAL | Status: DC
  Administered 2017-09-22: 14 mg via TRANSDERMAL
  Filled 2017-09-22: qty 1

## 2017-09-22 MED ORDER — ACETAMINOPHEN 325 MG PO TABS: 650.0000 mg | ORAL_TABLET | Freq: Four times a day (QID) | ORAL | Status: DC | PRN

## 2017-09-22 MED ORDER — ACETAMINOPHEN 650 MG RE SUPP: 650.0000 mg | Freq: Four times a day (QID) | RECTAL | Status: DC | PRN

## 2017-09-22 MED ORDER — ALBUTEROL SULFATE (2.5 MG/3ML) 0.083% IN NEBU: 2.5000 mg | INHALATION_SOLUTION | RESPIRATORY_TRACT | Status: DC | PRN

## 2017-09-22 MED ORDER — SODIUM CHLORIDE 0.9 % IV BOLUS (SEPSIS)
1000.0000 mL | Freq: Once | INTRAVENOUS | Status: AC
  Administered 2017-09-22: 1000 mL via INTRAVENOUS

## 2017-09-22 MED ORDER — DILTIAZEM HCL 60 MG PO TABS
60.0000 mg | ORAL_TABLET | Freq: Once | ORAL | Status: AC
  Administered 2017-09-22: 60 mg via ORAL
  Filled 2017-09-22: qty 1

## 2017-09-22 MED ORDER — DIGOXIN 0.25 MG/ML IJ SOLN
0.2500 mg | Freq: Once | INTRAMUSCULAR | Status: AC
  Administered 2017-09-22: 0.25 mg via INTRAVENOUS
  Filled 2017-09-22: qty 1

## 2017-09-22 NOTE — ED Triage Notes (Signed)
Pt c/o increased SOB for the past 2 days with numbness and tingling to BL hands. Pt lungs clear BL, no respiratory distress noted in triage. O2 sats WNL.Marland Kitchen

## 2017-09-22 NOTE — ED Provider Notes (Addendum)
[facility] Emergency Department Provider Note  ____________________________________________   First MD Initiated Contact with Patient 09/22/17 1428     (approximate)  I have reviewed the triage vital signs and the nursing notes.   HISTORY  Chief Complaint Shortness of Breath   HPI Laurie Richards is a 21 y.o. female who self presents to the emergency department with 2 days of insidious onset gradually progressive moderate to severe shortness of breath, malaise, and fatigue.  She says she has asthma and has asthma attacks roughly every month and she has been using her albuterol inhaler without improvement.  She has no chest pain.  She denies drug or alcohol use.  She denies history of thyroid issues.  She denies recent illness.  Past Medical History:  Diagnosis Date  . Asthma   . Irregular heartbeat     There are no active problems to display for this patient.   Past Surgical History:  Procedure Laterality Date  . APPENDECTOMY    . MOUTH SURGERY      Prior to Admission medications   Medication Sig Start Date End Date Taking? Authorizing Provider  acetaminophen (TYLENOL) 500 MG tablet Take 500 mg by mouth once as needed. For pain     [provider]  brompheniramine-pseudoephedrine-DM 30-2-10 MG/5ML syrup Take 5 mLs by mouth 4 (four) times daily as needed. 07/20/17   Joni ReiningSmith, Ronald K, PA-C  cephALEXin (KEFLEX) 500 MG capsule Take 1 capsule (500 mg total) by mouth 3 (three) times daily. 07/26/17   Minna AntisPaduchowski, Kevin, MD  diltiazem (CARDIZEM) 60 MG tablet Take 1 tablet (60 mg total) 3 (three) times daily by mouth. 09/22/17 09/22/18  Merrily Brittleifenbark, Gennifer Potenza, MD  ketorolac (TORADOL) 10 MG tablet Take 1 tablet (10 mg total) by mouth every 6 (six) hours as needed. 07/26/17   Minna AntisPaduchowski, Kevin, MD  Prenatal MV-Min-Fe Fum-FA-DHA ([employer] PRENATAL MULTI+DHA) 27-0.8-250 MG CAPS Take 1 tablet by mouth daily. 04/30/17   Shaune PollackIsaacs, Cameron, MD     Allergies Acetaminophen-codeine  Family History  Problem Relation Age of Onset  . Heart failure Mother   . Diabetes Other     Social History Social History   Tobacco Use  . Smoking status: Current Every Day Smoker    Packs/day: 0.50    Types: Cigarettes  . Smokeless tobacco: Never Used  Substance Use Topics  . Alcohol use: Yes  . Drug use: No    Review of Systems Constitutional: No fever/chills Eyes: No visual changes. ENT: No sore throat. Cardiovascular: Denies chest pain. Respiratory: Positive for shortness of breath. Gastrointestinal: No abdominal pain.  No nausea, no vomiting.  No diarrhea.  No constipation. Genitourinary: Negative for dysuria. Musculoskeletal: Negative for back pain. Skin: Negative for rash. Neurological: Negative for headaches, focal weakness or numbness.   ____________________________________________   PHYSICAL EXAM:  VITAL SIGNS: ED Triage Vitals  Enc Vitals Group     BP 09/22/17 1409 111/66     Pulse Rate 09/22/17 1409 68     Resp 09/22/17 1409 16     Temp 09/22/17 1409 98.6 F (37 C)     Temp Source 09/22/17 1409 Oral     SpO2 09/22/17 1409 99 %     Weight 09/22/17 1409 120 lb (54.4 kg)     Height 09/22/17 1409 5\' 4"  (1.626 m)     Head Circumference --      Peak Flow --      Pain Score 09/22/17 1408 0     Pain  Loc --      Pain Edu? --      Excl. in GC? --     Constitutional: Alert and oriented x4 somewhat anxious appearing nontoxic no diaphoresis speaks in full clear sentences Eyes: PERRL EOMI. pupils 6 mm to 4 mm and brisk bilaterally Head: Atraumatic. Nose: No congestion/rhinnorhea. Mouth/Throat: No trismus nonpalpable thyroid Neck: No stridor.   Cardiovascular: Irregularly irregular and tachycardic. Grossly normal heart sounds.  Good peripheral circulation. Respiratory: Normal respiratory effort.  No retractions. Lungs CTAB and moving good air Gastrointestinal: Soft nontender Musculoskeletal: No lower extremity  edema   Neurologic:  Normal speech and language. No gross focal neurologic deficits are appreciated. Skin:  Skin is warm, dry and intact. No rash noted. Psychiatric: Mood and affect are normal. Speech and behavior are normal.    ____________________________________________   DIFFERENTIAL includes but not limited to  Atrial fibrillation with rapid ventricular response, holiday heart syndrome, thyrotoxicosis, cocaine overdose, methamphetamine overdose ____________________________________________   LABS (all labs ordered are listed, but only abnormal results are displayed)  Labs Reviewed  BASIC METABOLIC PANEL - Abnormal; Notable for the following components:      Result Value   Potassium 3.3 (*)    Glucose, Bld 102 (*)    All other components within normal limits  HEPATIC FUNCTION PANEL - Abnormal; Notable for the following components:   ALT 10 (*)    Bilirubin, Direct <0.1 (*)    All other components within normal limits  CBC WITH DIFFERENTIAL/PLATELET - Abnormal; Notable for the following components:   RBC 5.46 (*)    RDW 15.2 (*)    All other components within normal limits  TSH - Abnormal; Notable for the following components:   TSH 0.255 (*)    All other components within normal limits  TROPONIN I  T4, FREE  URINE DRUG SCREEN, QUALITATIVE ([facility] ONLY)  PREGNANCY, URINE  HCG, QUANTITATIVE, PREGNANCY   Blood work reviewed and interpreted by me shows low TSH but normal free T4.  Unclear etiology of her RVR. __________________________________________  EKG  ED ECG REPORT I, Merrily Brittle, the attending physician, personally viewed and interpreted this ECG.  Date: 09/22/2017 EKG Time:  Rate: 161 Rhythm: Atrial fibrillation with rapid ventricular response QRS Axis: normal Intervals: normal ST/T Wave abnormalities: normal Narrative Interpretation: no evidence of acute ischemia  ____________________________________________  RADIOLOGY  Chest x-ray reviewed by me  with no acute disease ____________________________________________   PROCEDURES  Procedure(s) performed: no  Procedures  Critical Care performed: yes  CRITICAL CARE Performed by: Merrily Brittle   Total critical care time: 35 minutes  Critical care time was exclusive of separately billable procedures and treating other patients.  Critical care was necessary to treat or prevent imminent or life-threatening deterioration.  Critical care was time spent personally by me on the following activities: development of treatment plan with patient and/or surrogate as well as nursing, discussions with consultants, evaluation of patient's response to treatment, examination of patient, obtaining history from patient or surrogate, ordering and performing treatments and interventions, ordering and review of laboratory studies, ordering and review of radiographic studies, pulse oximetry and re-evaluation of patient's condition.   Observation: no ____________________________________________   INITIAL IMPRESSION / ASSESSMENT AND PLAN / ED COURSE  Pertinent labs & imaging results that were available during my care of the patient were reviewed by me and considered in my medical decision making (see chart for details).  On arrival the patient is irregularly irregular and narrow complex intermittently between 150  and 199.  This is consistent with atrial fibrillation.  Unclear etiology she does not drink alcohol and denies drug use.  Do not appreciate any thyroid tenderness or warmth, however thyrotoxicosis is always a consideration.  We will begin with 4 g of magnesium, fluids, and oral diltiazem.     ----------------------------------------- 3:34 PM on 09/22/2017 -----------------------------------------  The patient just got her first dose of diltiazem at this point.  I discussed the case with on-call cardiologist Dr. Lady Gary who would be happy to see the patient in clinic tomorrow morning at 8:30  in the morning if we are able to get her heart rate under control.  If after a second dose of oral diltiazem the patient's heart rate is not controlled she will likely require IV drip and inpatient admission.  Care signed over to Dr. Pershing Proud who will determine ultimate disposition. ____________________________________________   FINAL CLINICAL IMPRESSION(S) / ED DIAGNOSES  Final diagnoses:  Atrial fibrillation with RVR (HCC)      NEW MEDICATIONS STARTED DURING THIS VISIT:  This SmartLink is deprecated. Use AVSMEDLIST instead to display the medication list for a patient.   Note:  This document was prepared using Dragon voice recognition software and may include unintentional dictation errors.     Merrily Brittle, MD 09/22/17 1536    Merrily Brittle, MD 09/22/17 1558

## 2017-09-22 NOTE — ED Provider Notes (Signed)
21 year old female who was seen by Dr. Lamont Snowball for what appears to be atrial fibrillation with rapid ventricular response.  Patient denying any alcohol or drug use.  Given IV magnesium, 4 g, as well as p.o. diltiazem of 60 mg.  Plan is to observe for resolution of atrial fibrillation.  Physical Exam  BP 103/71   Pulse 68   Temp 98.6 F (37 C) (Oral)   Resp 16   Ht 5\' 4"  (1.626 m)   Wt 54.4 kg (120 lb)   LMP 09/02/2017 (Approximate)   SpO2 99%   BMI 20.60 kg/m  ----------------------------------------- 5:42 PM on 09/22/2017 -----------------------------------------   Physical Exam Patient at this time without complaints.  However, her heart rate remains in the 150s and her blood pressure has been between 80s and 90s systolic. ED Course  Procedures  MDM Patient given 2 L of normal saline IV with out resolution of her blood pressure.  Discussed the case further with Dr. Lady Gary recommends giving 0.25 mg of digoxin.  ----------------------------------------- 7:40 PM on 09/22/2017 -----------------------------------------  Despite digoxin patient is still in the 130s-150s with an irregularly irregular rhythm with a blood pressure in the 90s.  Patient to be admitted to the hospital.  Signed out to Dr. Katheren Shams.  The patient is aware of the diagnosis and treatment plan.       Myrna Blazer, MD 09/22/17 [phone]

## 2017-09-22 NOTE — H&P (Signed)
[employer] [facility] [place] [facility]   PATIENT NAME: Laurie Richards    MR#:  [id number]  DATE OF BIRTH:  [birth year]  DATE OF ADMISSION:  09/22/2017  PRIMARY CARE PHYSICIAN: Patient, No Pcp Per   REQUESTING/REFERRING PHYSICIAN:   CHIEF COMPLAINT:   Chief Complaint  Patient presents with  . Shortness of Breath    HISTORY OF PRESENT ILLNESS: Laurie Richards  is a 21 y.o. female with a known history of arrhythmia, asthma, tobacco smoking abuse/dependency presenting to the emergency room with 2-day history of worsening shortness of breath, dyspnea on exertion, associated with numbness/tingling of hands/face, generalized weakness/fatigue, heart palpitations, in the emergency room patient found to have A. fib with RVR with heart rate in the 180s, urine drug screen positive for opioids/marijuana, potassium 3.3, urinalysis positive-patient without symptomatology, TSH 0.25, T4 normal, T3 level pending, patient given digoxin at request of cardiology with minimal effect, patient in no apparent distress, resting comfortably in bed, family at the bedside, patient now be admitted with new onset A. fib with RVR with history of arrhythmia of unknown type, acute hyperthyroidism.  PAST MEDICAL HISTORY:   Past Medical History:  Diagnosis Date  . Asthma   . Irregular heartbeat     PAST SURGICAL HISTORY:  Past Surgical History:  Procedure Laterality Date  . APPENDECTOMY    . MOUTH SURGERY      SOCIAL HISTORY:  Social History   Tobacco Use  . Smoking status: Current Every Day Smoker    Packs/day: 0.50    Types: Cigarettes  . Smokeless tobacco: Never Used  Substance Use Topics  . Alcohol use: Yes    FAMILY HISTORY:  Family History  Problem Relation Age of Onset  . Heart failure Mother   . Diabetes Other     DRUG ALLERGIES:  Allergies  Allergen Reactions  . Acetaminophen-Codeine Itching and Swelling    Rash, vomiting if takes on empty stomach    REVIEW OF SYSTEMS:    CONSTITUTIONAL: No fever, +fatigue/weakness.  EYES: No blurred or double vision.  EARS, NOSE, AND THROAT: No tinnitus or ear pain.  RESPIRATORY: No cough, +shortness of breath,no wheezing or hemoptysis.  CARDIOVASCULAR: Heart palpitations, no chest pain, orthopnea, edema.  GASTROINTESTINAL: No nausea, vomiting, diarrhea or abdominal pain.  GENITOURINARY: No dysuria, hematuria.  ENDOCRINE: No polyuria, nocturia,  HEMATOLOGY: No anemia, easy bruising or bleeding SKIN: No rash or lesion. MUSCULOSKELETAL: No joint pain or arthritis.   NEUROLOGIC: No tingling, numbness, weakness.  PSYCHIATRY: No anxiety or depression.   MEDICATIONS AT HOME:  Prior to Admission medications   Medication Sig Start Date End Date Taking? Authorizing Provider  acetaminophen (TYLENOL) 500 MG tablet Take 500 mg by mouth once as needed. For pain     [provider]  diltiazem (CARDIZEM) 60 MG tablet Take 1 tablet (60 mg total) 3 (three) times daily by mouth. 09/22/17 09/22/18  Merrily Brittleifenbark, Neil, MD      PHYSICAL EXAMINATION:   VITAL SIGNS: Blood pressure 90/65, pulse 92, temperature 98.6 F (37 C), temperature source Oral, resp. rate (!) 26, height 5\' 4"  (1.626 m), weight 54.4 kg (120 lb), last menstrual period 09/02/2017, SpO2 99 %, unknown if currently breastfeeding.  GENERAL:  21 y.o.-year-old patient lying in the bed with no acute distress.  Nontoxic-appearing EYES: Pupils equal, round, reactive to light and accommodation. No scleral icterus. Extraocular muscles intact.  HEENT: Head atraumatic, normocephalic. Oropharynx and nasopharynx clear.  NECK:  Supple, no jugular venous distention. No thyroid  enlargement, no tenderness.  LUNGS: Normal breath sounds bilaterally, no wheezing, rales,rhonchi or crepitation. No use of accessory muscles of respiration.  CARDIOVASCULAR: Irregular rate and rhythm, S1, S2 normal. No murmurs, rubs, or gallops.  ABDOMEN: Soft, nontender, nondistended. Bowel sounds  present. No organomegaly or mass.  EXTREMITIES: No pedal edema, cyanosis, or clubbing.  NEUROLOGIC: Cranial nerves II through XII are intact. Muscle strength 5/5 in all extremities. Sensation intact. Gait not checked.  PSYCHIATRIC: The patient is alert and oriented x 3.  SKIN: No obvious rash, lesion, or ulcer.   LABORATORY PANEL:   CBC Recent Labs  Lab 09/22/17 1419  WBC 7.3  HGB 14.4  HCT 43.8  PLT 279  MCV 80.2  MCH 26.4  MCHC 32.9  RDW 15.2*  LYMPHSABS 1.8  MONOABS 0.5  EOSABS 0.1  BASOSABS 0.0   ------------------------------------------------------------------------------------------------------------------  Chemistries  Recent Labs  Lab 09/22/17 1419  NA 138  K 3.3*  CL 103  CO2 25  GLUCOSE 102*  BUN 10  CREATININE 0.76  CALCIUM 9.1  AST 21  ALT 10*  ALKPHOS 63  BILITOT 0.5   ------------------------------------------------------------------------------------------------------------------ estimated creatinine clearance is 95.5 mL/min (by C-G formula based on SCr of 0.76 mg/dL). ------------------------------------------------------------------------------------------------------------------ Recent Labs    09/22/17 1419  TSH 0.255*     Coagulation profile No results for input(s): INR, PROTIME in the last 168 hours. ------------------------------------------------------------------------------------------------------------------- No results for input(s): DDIMER in the last 72 hours. -------------------------------------------------------------------------------------------------------------------  Cardiac Enzymes Recent Labs  Lab 09/22/17 1419  TROPONINI <0.03   ------------------------------------------------------------------------------------------------------------------ Invalid input(s): POCBNP  ---------------------------------------------------------------------------------------------------------------  Urinalysis    Component  Value Date/Time   COLORURINE YELLOW (A) 07/25/2017 2146   APPEARANCEUR HAZY (A) 07/25/2017 2146   LABSPEC 1.018 07/25/2017 2146   PHURINE 7.0 07/25/2017 2146   GLUCOSEU NEGATIVE 07/25/2017 2146   HGBUR NEGATIVE 07/25/2017 2146   BILIRUBINUR NEGATIVE 07/25/2017 2146   KETONESUR NEGATIVE 07/25/2017 2146   PROTEINUR NEGATIVE 07/25/2017 2146   UROBILINOGEN 0.2 07/20/2011 1609   NITRITE POSITIVE (A) 07/25/2017 2146   LEUKOCYTESUR NEGATIVE 07/25/2017 2146     RADIOLOGY: Dg Chest 2 View  Result Date: 09/22/2017 CLINICAL DATA:  Shortness of breath and tachycardia EXAM: CHEST  2 VIEW COMPARISON:  12/29/2016 FINDINGS: The heart size and mediastinal contours are within normal limits. Both lungs are clear. The visualized skeletal structures are unremarkable. IMPRESSION: No active cardiopulmonary disease. Electronically Signed   By: Alcide Clever M.D.   On: 09/22/2017 15:30    EKG: Orders placed or performed during the hospital encounter of 09/22/17  . ED EKG  . ED EKG    IMPRESSION AND PLAN: 1 acute A. fib with RVR, newly diagnosed Noted history of arrhythmia of unknown type Suspect may be exacerbated by combination of mild hyperthyroidism, stimulation with albuterol inhaler use for asthma, illicit drug abuse, and hypokalemia  Admit to regular nursing floor bed on telemetry, give another dose of digoxin x1 now, p.o. Cardizem, cardiology consulted for expert opinion, rule out acute coronary syndrome with cardiac enzymes x3 sets, check echocardiogram, further evaluation of hyperthyroidism-check T3/thyroid ultrasound, recommend quitting smoking/illicit drug use, and continue close medical monitoring  2 acute probable hypothyroidism, mild  TSH 0.25, T4 normal Check free T3 level and thyroid ultrasound for further evaluation  3 acute abnormal urinalysis Patient without infectious symptomatology Check urine culture, empiric Rocephin for 3-day course for now given A. fib with RVR  4 chronic  tobacco smoking abuse/dependency Nicotine patch and cessation counseling ordered  5 chronic illicit drug  abuse Cessation counseling given  6 acute hypokalemia Replete with p.o. potassium and check BMP in the morning  7 chronic asthma without exacerbation Stable Xopenex as needed   All the records are reviewed and case discussed with ED provider. Management plans discussed with the patient, family and they are in agreement.  CODE STATUS: Code Status History    This patient does not have a recorded code status. Please follow your organizational policy for patients in this situation.       TOTAL TIME TAKING CARE OF THIS PATIENT: 35 minutes.    Laurie Richards M.D on 09/22/2017   Between 7am to 6pm - Pager - [phone]  After 6pm go to [email or web address] - [email or web address]  [facility]  Office  [phone]  CC: Primary care physician; Patient, No Pcp Per   Note: This dictation was prepared with Dragon dictation along with smaller phrase technology. Any transcriptional errors that result from this process are unintentional.

## 2017-09-23 ENCOUNTER — Encounter: Payer: Self-pay | Admitting: Nurse Practitioner

## 2017-09-23 ENCOUNTER — Inpatient Hospital Stay (HOSPITAL_COMMUNITY)
Admit: 2017-09-23 | Discharge: 2017-09-23 | Disposition: A | Discharge status: Home / Self Care | Payer: Self-pay | Attending: Nurse Practitioner | Admitting: Nurse Practitioner

## 2017-09-23 ENCOUNTER — Inpatient Hospital Stay: Payer: Self-pay

## 2017-09-23 DIAGNOSIS — F17200 Nicotine dependence, unspecified, uncomplicated: Secondary | ICD-10-CM

## 2017-09-23 DIAGNOSIS — E0590 Thyrotoxicosis, unspecified without thyrotoxic crisis or storm: Secondary | ICD-10-CM

## 2017-09-23 DIAGNOSIS — R0602 Shortness of breath: Secondary | ICD-10-CM

## 2017-09-23 DIAGNOSIS — R9431 Abnormal electrocardiogram [ECG] [EKG]: Secondary | ICD-10-CM

## 2017-09-23 DIAGNOSIS — I4891 Unspecified atrial fibrillation: Secondary | ICD-10-CM

## 2017-09-23 LAB — BASIC METABOLIC PANEL
Anion gap: 8 (ref 5–15)
BUN: 5 mg/dL — ABNORMAL LOW (ref 6–20)
CALCIUM: 8.5 mg/dL — AB (ref 8.9–10.3)
CO2: 24 mmol/L (ref 22–32)
CREATININE: 0.59 mg/dL (ref 0.44–1.00)
Chloride: 106 mmol/L (ref 101–111)
GFR calc Af Amer: >60 mL/min (ref >60)
GFR calc non Af Amer: >60 mL/min (ref >60)
GLUCOSE: 102 mg/dL — AB (ref 65–99)
Potassium: 4.1 mmol/L (ref 3.5–5.1)
Sodium: 138 mmol/L (ref 135–145)

## 2017-09-23 LAB — ECHOCARDIOGRAM COMPLETE
Height: 64 in
WEIGHTICAEL: 1846.4 [oz_av]

## 2017-09-23 LAB — MAGNESIUM: MAGNESIUM: 2.2 mg/dL (ref 1.7–2.4)

## 2017-09-23 LAB — TROPONIN I: Troponin I: <0.03 ng/mL (ref <0.03)

## 2017-09-23 LAB — DIGOXIN LEVEL: DIGOXIN LVL: 0.4 ng/mL — AB (ref 0.8–2.0)

## 2017-09-23 MED ORDER — GUAIFENESIN-DM 100-10 MG/5ML PO SYRP: 5.0000 mL | ORAL_SOLUTION | ORAL | Status: DC | PRN

## 2017-09-23 MED ORDER — DILTIAZEM HCL 30 MG PO TABS: 30.0000 mg | ORAL_TABLET | ORAL | 0 refills | Status: DC | PRN

## 2017-09-23 MED ORDER — METOPROLOL SUCCINATE ER 25 MG PO TB24: 12.5000 mg | ORAL_TABLET | Freq: Every day | ORAL | 0 refills | Status: DC

## 2017-09-23 MED ORDER — METOPROLOL SUCCINATE ER 25 MG PO TB24
12.5000 mg | ORAL_TABLET | Freq: Every day | ORAL | Status: DC
  Filled 2017-09-23: qty 1

## 2017-09-23 NOTE — Progress Notes (Signed)
Pt alert and oriented x4, no complaints of pain or discomfort.  Bed in low position, call bell within reach.  Bed alarms on and functioning.  Assessment done and charted.  Will continue to monitor and do hourly rounding throughout the shift 

## 2017-09-23 NOTE — Consult Note (Signed)
Cardiology Consult    Patient ID: Laurie Richards MRN: [id number], DOB/AGE: [birth year]   Admit date: 09/22/2017 Date of Consult: 09/23/2017  Primary Physician: Patient, No Pcp Per Primary Cardiologist: new - seen by Concha Se, MD  Requesting Provider: Suzanne Boron, MD  Patient Profile    Laurie Richards is a 21 y.o. female with a history of tob abuse, Marijuana abuse, and rare palpitations, who is being seen today for the evaluation of Afib RVR at the request of Dr. Luberta Mutter.  Past Medical History   Past Medical History:  Diagnosis Date  . Asthma   . Irregular heartbeat    a. occasional skipped beats since age 7.  . Marijuana abuse    a. smokes 1-2 x/wk.  Marland Kitchen PAF (paroxysmal atrial fibrillation) (HCC)    a. Dx 09/2017 in setting of mild hyperthyroidism.  . Tobacco abuse    a. smoking cigarettes since age 3.  . Tooth abscess 09/2017    Past Surgical History:  Procedure Laterality Date  . APPENDECTOMY    . MOUTH SURGERY       Allergies  Allergies  Allergen Reactions  . Acetaminophen-Codeine Itching and Swelling    Rash, vomiting if takes on empty stomach    History of Present Illness    21 year old ? with a history of tobacco and marijuana abuse.  She also reports a history of occasional skipped beats dating back to age 10.  She recently developed a tooth abscess which required treatment.  She otherwise does not have any significant cardiac history but does have significant family history of premature CAD in her mother side with her mother suffering an MI in her 45s and 2 uncles requiring stenting at a young age as well.  She was in her usual state of health until about 2 days ago, when she began to experience dyspnea on exertion and tachypalpitations.  Due to persistent symptoms, she presented to the emergency department on November 14 and was found to be in rapid atrial fibrillation.  She was treated with IV digoxin and oral diltiazem and subsequently converted to sinus  rhythm.  She currently remains in sinus rhythm and is now asymptomatic.  Echocardiogram is pending.  She denies chest pain, PND, orthopnea, dizziness, syncope, edema, or early satiety.  TSH is mildly suppressed at 0.255.  Free T4 is normal.  Inpatient Medications    . enoxaparin (LOVENOX) injection  40 mg Subcutaneous Q24H  . metoprolol succinate  12.5 mg Oral Daily  . nicotine  14 mg Transdermal Daily  . sodium chloride flush  3 mL Intravenous Q12H    Family History    Family History  Problem Relation Age of Onset  . Heart failure Mother   . Coronary artery disease Mother        Status post MI and stenting in her 30s  . Diabetes Other   . Coronary artery disease Maternal Uncle        Status post stenting at a young age  . Coronary artery disease Maternal Uncle        Status post stenting at a young age    Social History    Social History   Socioeconomic History  . Marital status: Single    Spouse name: Not on file  . Number of children: Not on file  . Years of education: Not on file  . Highest education level: Not on file  Social Needs  . Financial resource strain: Not on file  .  Food insecurity - worry: Not on file  . Food insecurity - inability: Not on file  . Transportation needs - medical: Not on file  . Transportation needs - non-medical: Not on file  Occupational History  . Not on file  Tobacco Use  . Smoking status: Current Every Day Smoker    Packs/day: 0.50    Types: Cigarettes  . Smokeless tobacco: Never Used  Substance and Sexual Activity  . Alcohol use: Yes  . Drug use: Yes    Types: Marijuana  . Sexual activity: Yes    Birth control/protection: None  Other Topics Concern  . Not on file  Social History Narrative   Lives locally with fiance.  Does not routinely exercise.     Review of Systems    General:  No chills, fever, night sweats or weight changes.  Cardiovascular:  No chest pain, +++ dyspnea on exertion, no edema, orthopnea, +++  palpitations, no paroxysmal nocturnal dyspnea. Dermatological: No rash, lesions/masses Respiratory: No cough, dyspnea Urologic: No hematuria, dysuria Abdominal:   No nausea, vomiting, diarrhea, bright red blood per rectum, melena, or hematemesis Neurologic:  No visual changes, wkns, changes in mental status. All other systems reviewed and are otherwise negative except as noted above.  Physical Exam    Blood pressure 102/63, pulse 97, temperature 98.2 F (36.8 C), temperature source Oral, resp. rate 16, height 5\' 4"  (1.626 m), weight 115 lb 6.4 oz (52.3 kg), last menstrual period 09/02/2017, SpO2 99 %, unknown if currently breastfeeding.  General: Pleasant, NAD Psych: Normal affect. Neuro: Alert and oriented X 3. Moves all extremities spontaneously. HEENT: Normal  Neck: Supple without bruits or JVD. Lungs:  Resp regular and unlabored, CTA. Heart: RRR no s3, s4, or murmurs. Abdomen: Soft, non-tender, non-distended, BS + x 4.  Extremities: No clubbing, cyanosis or edema. DP/PT/Radials 2+ and equal bilaterally.  Labs     Recent Labs    09/22/17 1419 09/22/17 2145 09/23/17 0216 09/23/17 0734  TROPONINI <0.03 <0.03 <0.03 <0.03   Lab Results  Component Value Date   WBC 7.3 09/22/2017   HGB 14.4 09/22/2017   HCT 43.8 09/22/2017   MCV 80.2 09/22/2017   PLT 279 09/22/2017    Recent Labs  Lab 09/22/17 1419 09/23/17 0216  NA 138 138  K 3.3* 4.1  CL 103 106  CO2 25 24  BUN 10 5*  CREATININE 0.76 0.59  CALCIUM 9.1 8.5*  PROT 7.1  --   BILITOT 0.5  --   ALKPHOS 63  --   ALT 10*  --   AST 21  --   GLUCOSE 102* 102*     Radiology Studies    Dg Chest 2 View  Result Date: 09/22/2017 CLINICAL DATA:  Shortness of breath and tachycardia EXAM: CHEST  2 VIEW COMPARISON:  12/29/2016 FINDINGS: The heart size and mediastinal contours are within normal limits. Both lungs are clear. The visualized skeletal structures are unremarkable. IMPRESSION: No active cardiopulmonary disease.  Electronically Signed   By: Alcide Clever M.D.   On: 09/22/2017 15:30   Dg Ankle Complete Right  Result Date: 08/24/2017 CLINICAL DATA:  Dropped on oxygen tank onto the right foot. Pain, bruising, and swelling. EXAM: RIGHT ANKLE - COMPLETE 3+ VIEW; RIGHT FOOT COMPLETE - 3+ VIEW COMPARISON:  Right ankle 12/22/2010 FINDINGS: Three views of the right foot and three views of the right ankle are obtained. There is no evidence of acute fracture or dislocation. No focal bone lesion or bone destruction. Bone cortex  appears intact. Soft tissues are unremarkable. IMPRESSION: No evidence of acute fracture or dislocation involving the right foot or the right ankle. Electronically Signed   By: Burman Nieves M.D.   On: 08/24/2017 21:14   Dg Foot Complete Right  Result Date: 08/24/2017 CLINICAL DATA:  Dropped on oxygen tank onto the right foot. Pain, bruising, and swelling. EXAM: RIGHT ANKLE - COMPLETE 3+ VIEW; RIGHT FOOT COMPLETE - 3+ VIEW COMPARISON:  Right ankle 12/22/2010 FINDINGS: Three views of the right foot and three views of the right ankle are obtained. There is no evidence of acute fracture or dislocation. No focal bone lesion or bone destruction. Bone cortex appears intact. Soft tissues are unremarkable. IMPRESSION: No evidence of acute fracture or dislocation involving the right foot or the right ankle. Electronically Signed   By: Burman Nieves M.D.   On: 08/24/2017 21:14    ECG & Cardiac Imaging    Atrial fibrillation, 161, no acute ST or T changes.  Assessment & Plan    1.  Atrial fibrillation with rapid ventricular response: Patient presented to the emergency department with a 2-day history of palpitations and dyspnea.  She was found to be in rapid atrial fibrillation and subsequently converted after receiving IV digoxin and oral diltiazem.  Blood pressure is soft.  She was given diltiazem CD 120 mg this morning we will switch this to Toprol-XL 12.5 mg to begin tomorrow.  CHA2DS2VASc is 1 in  the setting of female gender.  Echo pending.  Unlikely that she would require oral anticoagulation.  We discussed the importance of tobacco and marijuana cessation.  Further workup for possible hyperthyroidism per internal medicine.  2.  Mild hyperthyroidism: TSH 0.255.  Free T4 is normal.  Additional labs pending.  Likely contributing to atrial fibrillation.  Further workup per internal medicine.  3.  Tobacco and marijuana abuse: Complete cessation advised.  She is been using tobacco since age 18 and smokes between a half and 1 pack/day.  She is not currently motivated to quit either substance.  4.  Family history of premature coronary artery disease: Follow-up lipids with a low threshold to add statin if appropriate (if LDL greater than 190).  Signed, Nicolasa Ducking, NP 09/23/2017, 12:58 PM  For questions or updates, please contact   Please consult [email or web address] for contact info under Cardiology/STEMI.

## 2017-09-23 NOTE — Progress Notes (Signed)
Discharge: Pt d/c from room via wheelchair, Family member with the pt. Discharge instructions given to the patient and family members.  No questions from pt, reintegrated to the pt to call or go to the ED for chest discomfort. Pt dressed in street clothes and left with discharge papers and prescriptions in hand. IV d/ced, tele removed and no complaints of pain or discomfort. 

## 2017-09-23 NOTE — Progress Notes (Signed)
*  PRELIMINARY RESULTS* Echocardiogram 2D Echocardiogram has been performed.  Cristela Blue 09/23/2017, 12:15 PM

## 2017-09-23 NOTE — Progress Notes (Addendum)
Patient is stable for discharge, discussed with Dr. Mariah Milling, he mentioned that echo is normal, BP is still soft because of Cardizem that she received this morning.  Will take Toprol-XL 12.5 mg daily from tomorrow morning, use Cardizem 30 mg as needed for breakthrough palpitations and heart rate more than 100, patient will follow up with Dr. Gollancardiology.

## 2017-09-23 NOTE — Care Management (Signed)
Consult for medication needs.  Provided patient with coupons for cardizem and metoprolol. Denies issues paying for meds.

## 2017-09-24 LAB — URINE CULTURE: Culture: NO GROWTH

## 2017-09-24 LAB — HIV ANTIBODY (ROUTINE TESTING W REFLEX): HIV Screen 4th Generation wRfx: NONREACTIVE

## 2017-09-24 LAB — T3, FREE: T3, Free: 3.4 pg/mL (ref 2.0–4.4)

## 2017-09-26 NOTE — Discharge Summary (Signed)
Laurie Richards, is a 21 y.o. female  DOB 11-Nov-1995  MRN [id number].  Admission date:  09/22/2017  Admitting Physician  Bertrum Sol, MD  Discharge Date:  09/23/2017   Primary MD  Patient, No Pcp Per  Recommendations for primary care physician for things to follow:   Patient is advised to follow-up with Dr. Dossie Arbour in 2 weeks   Admission Diagnosis  Atrial fibrillation with RVR [facility]) [I48.91]   Discharge Diagnosis  Atrial fibrillation with RVR (HCC) [I48.91]    Active Problems:   A-fib [facility])      Past Medical History:  Diagnosis Date  . Asthma   . Irregular heartbeat    a. occasional skipped beats since age 58.  . Marijuana abuse    a. smokes 1-2 x/wk.  Marland Kitchen PAF (paroxysmal atrial fibrillation) (HCC)    a. Dx 09/2017 in setting of mild hyperthyroidism.  . Tobacco abuse    a. smoking cigarettes since age 48.  . Tooth abscess 09/2017    Past Surgical History:  Procedure Laterality Date  . APPENDECTOMY    . MOUTH SURGERY         History of present illness and  Hospital Course:     Kindly see H&P for history of present illness and admission details, please review complete Labs, Consult reports and Test reports for all details in brief  HPI  from the history and physical done on the day of admission 21 year old female with history of tobacco abuse, marijuana abuse came in because of palpitations, found to have atrial fibrillation with RVR 161 bpm in the emergency room, she is admitted to telemetry for A. fib with RVR.   Hospital Course   #1 A. fib with RVR: Etiology unclear likely is exacerbated by viral URI in addition to illicit substances.  Patient initially had atrial fibrillation but converted to normal sinus rhythm with diltiazem, digoxin.  Patient received Cardizem 120 mg the following day of  admission resulted in soft blood pressure.  Seen by cardiology, echocardiogram is done which showed no acute findings with normal ejection fraction, no significant wall abnormalities, normal left atrial size.  So cardiology recommended Toprol-XL 12.5 mg p.o. daily, we recommended her to use Cardizem 30 mg as needed for breakthrough palpitations or tachycardia.  No need of anticoagulation, patient discharged home in stable condition. 2.  Smoker/marijuana use: Advised to quit smoking and also illicit substances. 3.  Hypothyroidism: Patient TSH 0.255, patient can follow-up with commended. 4.  Viral URI: Less likely given strep because she is not had any sore throat during the following day.  Discharged home in stable condition.   Discharge Condition: stable   Follow UP  Follow-up Information    Dalia Heading, MD. Go in 1 day(s).   Specialty:  Cardiology Why:  0830 am Contact information: [address] [facility] [place] - CARDIOLOGY [place] [address] [phone]        Antonieta Iba, MD. Schedule an appointment as soon as possible for a visit in 1 week(s).   Specialty:  Cardiology Contact information: [address] [place] [address] [phone]             Discharge Instructions  and  Discharge Medications      Allergies as of 09/23/2017      Reactions   Acetaminophen-codeine Itching, Swelling   Rash, vomiting if takes on empty stomach      Medication List    TAKE  these medications   diltiazem 30 MG tablet Commonly known as:  CARDIZEM Take 1 tablet (30 mg total) as needed by mouth. If HR more than 100   metoprolol succinate 25 MG 24 hr tablet Commonly known as:  TOPROL-XL Take 0.5 tablets (12.5 mg total) daily by mouth.   TYLENOL 500 MG tablet Generic drug:  acetaminophen Take 500 mg by mouth once as needed. For pain         Diet and Activity recommendation: See Discharge Instructions above   Consults obtained  -cardiology    Major procedures and Radiology Reports - PLEASE review detailed and final reports for all details, in brief -      Dg Chest 2 View  Result Date: 09/22/2017 CLINICAL DATA:  Shortness of breath and tachycardia EXAM: CHEST  2 VIEW COMPARISON:  12/29/2016 FINDINGS: The heart size and mediastinal contours are within normal limits. Both lungs are clear. The visualized skeletal structures are unremarkable. IMPRESSION: No active cardiopulmonary disease. Electronically Signed   By: Alcide CleverMark  Lukens M.D.   On: 09/22/2017 15:30   [place] Thyroid  Result Date: 09/23/2017 CLINICAL DATA:  Hyperthyroidism EXAM: THYROID ULTRASOUND TECHNIQUE: Ultrasound examination of the thyroid gland and adjacent soft tissues was performed. COMPARISON:  None. FINDINGS: Parenchymal Echotexture: Normal Isthmus: 3 mm Right lobe: 5.1 x 1.5 x 1.2 cm Left lobe: 4.5 x 1.2 x 1.2 cm _________________________________________________________ Estimated total number of nodules >/= 1 cm: 0 Number of spongiform nodules >/=  2 cm not described below (TR1): 0 Number of mixed cystic and solid nodules >/= 1.5 cm not described below (TR2): 0 _________________________________________________________ Small incidental subcentimeter cystic nodule in the left thyroid midpole only measures 5 mm in greatest dimension. This would not meet criteria for any biopsy or follow-up. No other significant thyroid abnormality. No surrounding adenopathy. IMPRESSION: No significant thyroid abnormality. The above is in keeping with the ACR TI-RADS recommendations - J Am Coll Radiol 2017;14:587-595. Electronically Signed   By: Judie PetitM.  Shick M.D.   On: 09/23/2017 13:38    Micro Results     Recent Results (from the past 240 hour(s))  Urine Culture     Status: None   Collection Time: 09/22/17  5:20 PM  Result Value Ref Range Status   Specimen Description URINE, CLEAN CATCH  Final   Special Requests NONE  Final   Culture   Final    NO GROWTH Performed at [facility], [address] [place], [place] [address]    Report Status 09/24/2017 FINAL  Final       Today   Subjective:   Zella Richereresa Gronau today has no headache,no chest abdominal pain,no new weakness tingling or numbness, feels much better wants to go home today.  Objective:   Blood pressure (!) 90/50, pulse 98, temperature 98.2 F (36.8 C), temperature source Oral, resp. rate 16, height 5\' 4"  (1.626 m), weight 52.3 kg (115 lb 6.4 oz), last menstrual period 09/02/2017, SpO2 96 %, unknown if currently breastfeeding.  No intake or output data in the 24 hours ending 09/26/17 1309  Exam Awake Alert, Oriented x 3, No new F.N deficits, Normal affect [place].AT,PERRAL Supple Neck,No JVD, No cervical lymphadenopathy appriciated.  Symmetrical Chest wall movement, Good air movement bilaterally, CTAB RRR,No Gallops,Rubs or new Murmurs, No Parasternal Heave +ve B.Sounds, Abd Soft, Non tender, No organomegaly appriciated, No rebound -guarding or rigidity. No Cyanosis, Clubbing or edema, No new Rash or bruise  Data Review   CBC w Diff:  Lab Results  Component Value Date   WBC 7.3 09/22/2017   HGB 14.4 09/22/2017   HCT 43.8 09/22/2017   PLT 279 09/22/2017   LYMPHOPCT 25 09/22/2017   MONOPCT 7 09/22/2017   EOSPCT 2 09/22/2017   BASOPCT 1 09/22/2017    CMP:  Lab Results  Component Value Date   NA 138 09/23/2017   K 4.1 09/23/2017   CL 106 09/23/2017   CO2 24 09/23/2017   BUN 5 (L) 09/23/2017   CREATININE 0.59 09/23/2017   PROT 7.1 09/22/2017   ALBUMIN 3.9 09/22/2017   BILITOT 0.5 09/22/2017   ALKPHOS 63 09/22/2017   AST 21 09/22/2017   ALT 10 (L) 09/22/2017  .   Total Time in preparing paper work, data evaluation and todays exam - 35 minutes  Katha Hamming M.D on 09/23/2017 at 1:09 PM    Note: This dictation was prepared with Dragon dictation along with smaller phrase technology. Any transcriptional errors that result from this process are unintentional.

## 2017-09-27 ENCOUNTER — Telehealth: Payer: Self-pay | Admitting: Cardiovascular Disease

## 2017-09-27 NOTE — Telephone Encounter (Signed)
Desp[facility] Pole [facility] Triage        12/3 2:20  Dr. Mariah Milling

## 2017-09-28 NOTE — Telephone Encounter (Signed)
Left voicemail message to call back  

## 2017-09-28 NOTE — Telephone Encounter (Signed)
Spoke with patients mother and she states that she messaged and told her of appointment that was scheduled and our phone number. She does not have phone number to contact her just a messaging number. She states that she is aware of appointment scheduled.

## 2017-10-10 DIAGNOSIS — E0590 Thyrotoxicosis, unspecified without thyrotoxic crisis or storm: Secondary | ICD-10-CM | POA: Insufficient documentation

## 2017-10-10 DIAGNOSIS — F17200 Nicotine dependence, unspecified, uncomplicated: Secondary | ICD-10-CM | POA: Insufficient documentation

## 2017-10-10 NOTE — Progress Notes (Deleted)
Cardiology Office Note  Date:  10/10/2017   ID:  Laurie Richards, DOB 02-May-1996, MRN [id number]  PCP:  Patient, No Pcp Per   No chief complaint on file.   HPI:  21 year old woman with history of tobacco abuse, marijuana abuse, prior palpitations who presents after developing tachycardia.  Reports having sore throat beginning of the week proximally 4 days ago, seemed to go away but came back 2 days ago.  Think she may be fighting a viral infection Reports developing shortness of breath, tachypalpitations that were persistent.  If she relaxed heart rate improved, tremendous tachycardia with heavy exertion  Presented to the emergency room and treated with diltiazem, digoxin Given extended release diltiazem, converted to normal sinus rhythm Blood pressure low 90 systolic, asymptomatic  Echocardiogram results reviewed personally by myself, reviewed with her showing normal LV function, no significant valve disease  PMH:   has a past medical history of Asthma, Irregular heartbeat, Marijuana abuse, PAF (paroxysmal atrial fibrillation) (HCC), Tobacco abuse, and Tooth abscess (09/2017).  PSH:    Past Surgical History:  Procedure Laterality Date  . APPENDECTOMY    . MOUTH SURGERY      Current Outpatient Medications  Medication Sig Dispense Refill  . acetaminophen (TYLENOL) 500 MG tablet Take 500 mg by mouth once as needed. For pain     . diltiazem (CARDIZEM) 30 MG tablet Take 1 tablet (30 mg total) as needed by mouth. If HR more than 100 30 tablet 0  . metoprolol succinate (TOPROL-XL) 25 MG 24 hr tablet Take 0.5 tablets (12.5 mg total) daily by mouth. 30 tablet 0   No current facility-administered medications for this visit.      Allergies:   Acetaminophen-codeine   Social History:  The patient  reports that she has been smoking cigarettes.  She has been smoking about 0.50 packs per day. she has never used smokeless tobacco. She reports that she drinks alcohol. She reports that she  uses drugs. Drug: Marijuana.   Family History:   family history includes Coronary artery disease in her maternal uncle, maternal uncle, and mother; Diabetes in her other; Heart failure in her mother.    Review of Systems: ROS   PHYSICAL EXAM: VS:  LMP 04/14/2017  , BMI There is no height or weight on file to calculate BMI. GEN: Well nourished, well developed, in no acute distress HEENT: normal Neck: no JVD, carotid bruits, or masses Cardiac: RRR; no murmurs, rubs, or gallops,no edema  Respiratory:  clear to auscultation bilaterally, normal work of breathing GI: soft, nontender, nondistended, + BS MS: no deformity or atrophy Skin: warm and dry, no rash Neuro:  Strength and sensation are intact Psych: euthymic mood, full affect    Recent Labs: 09/22/2017: ALT 10; Hemoglobin 14.4; Platelets 279; TSH 0.255 09/23/2017: BUN 5; Creatinine, Ser 0.59; Magnesium 2.2; Potassium 4.1; Sodium 138    Lipid Panel No results found for: CHOL, HDL, LDLCALC, TRIG    Wt Readings from Last 3 Encounters:  09/22/17 115 lb 6.4 oz (52.3 kg)  08/24/17 120 lb (54.4 kg)  07/20/17 125 lb (56.7 kg)       ASSESSMENT AND PLAN:  No diagnosis found. 1) atrial fibrillation, paroxysmal Presenting with RVR, etiology unclear, possibly exacerbated by recent viral URI  converted to normal sinus rhythm with diltiazem and digoxin Low blood pressure on Cardizem 120 mg Echocardiogram with no acute findings, normal ejection fraction, no significant valve disease, normal left atrial size -Recommend we use metoprolol succinate 12 up  to 25 mg daily Diltiazem 30 mg as needed for breakthrough palpitations or tachycardia Would hold anticoagulation at this time Outpatient follow-up  2) Smoker/marijuana We have encouraged her to continue to work on weaning her cigarettes and smoking cessation. She will continue to work on this and does not want any assistance with chantix.    3) hyperthyroid TSH 0.255 with  normal T4 Follow-up with primary care  4) viral URI Now with sinus congestion No antibiotics needed  Disposition:   F/U  6 months  No orders of the defined types were placed in this encounter.    Signed, Dossie Arbourim Keziyah Kneale, M.D., Ph.D. 10/10/2017  [facility] [place] [phone]

## 2017-10-11 ENCOUNTER — Ambulatory Visit: Payer: Self-pay | Admitting: Cardiovascular Disease

## 2017-10-20 ENCOUNTER — Encounter: Payer: Self-pay | Admitting: Cardiovascular Disease

## 2018-05-06 ENCOUNTER — Emergency Department
Admission: EM | Admit: 2018-05-06 | Discharge: 2018-05-06 | Disposition: A | Discharge status: Home / Self Care | Payer: Self-pay | Attending: Emergency Medicine | Admitting: Emergency Medicine

## 2018-05-06 DIAGNOSIS — L0231 Cutaneous abscess of buttock: Secondary | ICD-10-CM | POA: Insufficient documentation

## 2018-05-06 DIAGNOSIS — J45909 Unspecified asthma, uncomplicated: Secondary | ICD-10-CM | POA: Insufficient documentation

## 2018-05-06 DIAGNOSIS — Z79899 Other long term (current) drug therapy: Secondary | ICD-10-CM | POA: Insufficient documentation

## 2018-05-06 DIAGNOSIS — L03317 Cellulitis of buttock: Secondary | ICD-10-CM | POA: Insufficient documentation

## 2018-05-06 DIAGNOSIS — F17210 Nicotine dependence, cigarettes, uncomplicated: Secondary | ICD-10-CM | POA: Insufficient documentation

## 2018-05-06 DIAGNOSIS — Z23 Encounter for immunization: Secondary | ICD-10-CM | POA: Insufficient documentation

## 2018-05-06 MED ORDER — TETANUS-DIPHTH-ACELL PERTUSSIS 5-2.5-18.5 LF-MCG/0.5 IM SUSP
0.5000 mL | Freq: Once | INTRAMUSCULAR | Status: AC
  Administered 2018-05-06: 0.5 mL via INTRAMUSCULAR
  Filled 2018-05-06: qty 0.5

## 2018-05-06 MED ORDER — PROPOFOL 10 MG/ML IV BOLUS
INTRAVENOUS | Status: AC | PRN
  Administered 2018-05-06: 30 mg via INTRAVENOUS
  Administered 2018-05-06: 50 mg via INTRAVENOUS
  Administered 2018-05-06 (×2): 20 mg via INTRAVENOUS
  Administered 2018-05-06: 30 mg via INTRAVENOUS

## 2018-05-06 MED ORDER — SODIUM CHLORIDE 0.9 % IV BOLUS
1000.0000 mL | Freq: Once | INTRAVENOUS | Status: AC
  Administered 2018-05-06: 1000 mL via INTRAVENOUS

## 2018-05-06 MED ORDER — BUPIVACAINE HCL (PF) 0.25 % IJ SOLN
INTRAMUSCULAR | Status: AC
  Filled 2018-05-06: qty 30

## 2018-05-06 MED ORDER — SULFAMETHOXAZOLE-TRIMETHOPRIM 800-160 MG PO TABS
1.0000 | ORAL_TABLET | Freq: Once | ORAL | Status: AC
  Administered 2018-05-06: 1 via ORAL
  Filled 2018-05-06: qty 1

## 2018-05-06 MED ORDER — OXYCODONE-ACETAMINOPHEN 5-325 MG PO TABS: 1.0000 | ORAL_TABLET | ORAL | 0 refills | Status: DC | PRN

## 2018-05-06 MED ORDER — BUPIVACAINE HCL (PF) 0.5 % IJ SOLN
30.0000 mL | Freq: Once | INTRAMUSCULAR | Status: AC
  Administered 2018-05-06: 30 mL
  Filled 2018-05-06: qty 30

## 2018-05-06 MED ORDER — PROPOFOL 10 MG/ML IV BOLUS
100.0000 mg | Freq: Once | INTRAVENOUS | Status: DC
  Filled 2018-05-06: qty 20

## 2018-05-06 MED ORDER — SULFAMETHOXAZOLE-TRIMETHOPRIM 800-160 MG PO TABS: 1.0000 | ORAL_TABLET | Freq: Two times a day (BID) | ORAL | 0 refills | Status: AC

## 2018-05-06 NOTE — ED Notes (Addendum)
Patient discharge and follow up information reviewed with patient by ED nursing staff and patient given the opportunity to ask questions pertaining to ED visit and discharge plan of care. Patient advised that should symptoms not continue to improve, resolve entirely, or should new symptoms develop then a follow up visit with their PCP or a return visit to the ED may be warranted. Patient verbalized consent and understanding of discharge plan of care including potential need for further evaluation. Patient being discharged in stable condition per attending ED physician on duty.   Pt Ambulatory without difficulty, alert and oriented, husband driving pt home.

## 2018-05-06 NOTE — Discharge Instructions (Addendum)
Please make sure that you keep your wound clean and dry and washed it copiously in the shower every day.  Please make sure a physician sees your wound in 2 days.  If you are unable to see her primary care physician we are more than happy to see you here in the emergency department.  Please feel better and it was a pleasure to take care of you today.  It was a pleasure to take care of you today, and thank you for coming to our emergency department.  If you have any questions or concerns before leaving please ask the nurse to grab me and I'm more than happy to go through your aftercare instructions again.  If you were prescribed any opioid pain medication today such as Norco, Vicodin, Percocet, morphine, hydrocodone, or oxycodone please make sure you do not drive when you are taking this medication as it can alter your ability to drive safely.  If you have any concerns once you are home that you are not improving or are in fact getting worse before you can make it to your follow-up appointment, please do not hesitate to call 911 and come back for further evaluation.  Merrily Brittle, MD

## 2018-05-06 NOTE — ED Notes (Signed)
Moderate Sedation form signed by pt, one placed on chart and one given to pt

## 2018-05-06 NOTE — ED Notes (Signed)
Pt ambulated to in rm bathroom without difficulty. Pt voided and ambulated back to bed. Pt's husband at bedside. EDP in rm at this time.

## 2018-05-06 NOTE — ED Notes (Signed)
Pt talking without difficulty, able to move all extremities without difficulty as well.

## 2018-05-06 NOTE — ED Provider Notes (Signed)
[facility] Emergency Department Provider Note  ____________________________________________   First MD Initiated Contact with Patient 05/06/18 0345     (approximate)  I have reviewed the triage vital signs and the nursing notes.   HISTORY  Chief Complaint Abscess   HPI Laurie Richards is a 22 y.o. female who self presents to the emergency department with roughly 1 week of painful red mass to her right posterior thigh.  Yesterday it spontaneously drained.  The pain is now severe.  No fevers or chills.  She denies history of IV drug use.  No history of abscesses in the past.  The pain is in her thigh and seems to radiate down towards her knee.   The pain is worse with movement improved with rest.  The pain is sharp and severe.   Past Medical History:  Diagnosis Date  . Asthma   . Irregular heartbeat    a. occasional skipped beats since age 38.  . Marijuana abuse    a. smokes 1-2 x/wk.  Marland Kitchen PAF (paroxysmal atrial fibrillation) (HCC)    a. Dx 09/2017 in setting of mild hyperthyroidism.  . Tobacco abuse    a. smoking cigarettes since age 19.  . Tooth abscess 09/2017    Patient Active Problem List   Diagnosis Date Noted  . Smoker 10/10/2017  . Hyperthyroidism 10/10/2017  . A-fib (HCC) 09/22/2017    Past Surgical History:  Procedure Laterality Date  . APPENDECTOMY    . MOUTH SURGERY      Prior to Admission medications   Medication Sig Start Date End Date Taking? Authorizing Provider  acetaminophen (TYLENOL) 500 MG tablet Take 500 mg by mouth once as needed. For pain     [provider]  diltiazem (CARDIZEM) 30 MG tablet Take 1 tablet (30 mg total) as needed by mouth. If HR more than 100 09/23/17 09/23/18  Katha Hamming, MD  metoprolol succinate (TOPROL-XL) 25 MG 24 hr tablet Take 0.5 tablets (12.5 mg total) daily by mouth. 09/24/17   Katha Hamming, MD  oxyCODONE-acetaminophen (PERCOCET/ROXICET) 5-325 MG tablet Take 1 tablet by  mouth every 4 (four) hours as needed for severe pain. 05/06/18   Merrily Brittle, MD  sulfamethoxazole-trimethoprim (BACTRIM DS,SEPTRA DS) 800-160 MG tablet Take 1 tablet by mouth 2 (two) times daily for 10 days. 05/06/18 05/16/18  Merrily Brittle, MD    Allergies Acetaminophen-codeine  Family History  Problem Relation Age of Onset  . Heart failure Mother   . Coronary artery disease Mother        Status post MI and stenting in her 30s  . Diabetes Other   . Coronary artery disease Maternal Uncle        Status post stenting at a young age  . Coronary artery disease Maternal Uncle        Status post stenting at a young age    Social History Social History   Tobacco Use  . Smoking status: Current Every Day Smoker    Packs/day: 0.50    Types: Cigarettes  . Smokeless tobacco: Never Used  Substance Use Topics  . Alcohol use: Yes  . Drug use: Yes    Types: Marijuana    Review of Systems Constitutional: No fever/chills Eyes: No visual changes. ENT: No sore throat. Cardiovascular: Denies chest pain. Respiratory: Denies shortness of breath. Gastrointestinal: No abdominal pain.  No nausea, no vomiting.  No diarrhea.  No constipation. Genitourinary: Negative for dysuria. Musculoskeletal: Negative for back pain. Skin: Positive for  rash. Neurological: Negative for headaches, focal weakness or numbness.   ____________________________________________   PHYSICAL EXAM:  VITAL SIGNS: ED Triage Vitals [05/06/18 0106]  Enc Vitals Group     BP (!) 125/91     Pulse Rate (!) 124     Resp 18     Temp 98.5 F (36.9 C)     Temp Source Oral     SpO2 98 %     Weight 115 lb (52.2 kg)     Height      Head Circumference      Peak Flow      Pain Score 8     Pain Loc      Pain Edu?      Excl. in GC?     Constitutional: Alert and oriented x4 tearful and uncomfortable appearing Eyes: PERRL EOMI. Head: Atraumatic. Nose: No congestion/rhinnorhea. Mouth/Throat: No trismus Neck: No  stridor.   Cardiovascular: Tachycardic rate, regular rhythm. Grossly normal heart sounds.  Good peripheral circulation. Respiratory: Normal respiratory effort.  No retractions. Lungs CTAB and moving good air Gastrointestinal: Soft nontender Musculoskeletal: No lower extremity edema   Neurologic:  Normal speech and language. No gross focal neurologic deficits are appreciated. Skin: 6 cm abscess to right posterior thigh with necrotic core spontaneously draining and overlying cellulitis Psychiatric: Mood and affect are normal. Speech and behavior are normal.    ____________________________________________   DIFFERENTIAL includes but not limited to  Abscess, cellulitis, necrotizing soft tissue infection ____________________________________________   LABS (all labs ordered are listed, but only abnormal results are displayed)  Labs Reviewed - No data to display   __________________________________________  EKG   ____________________________________________  RADIOLOGY   ____________________________________________   PROCEDURES  Procedure(s) performed: Yes  .Sedation Date/Time: 05/06/2018 4:48 AM Performed by: Merrily Brittle, MD Authorized by: Merrily Brittle, MD   Consent:    Consent obtained:  Written   Consent given by:  Patient   Risks discussed:  Allergic reaction, dysrhythmia, inadequate sedation, nausea, prolonged hypoxia resulting in organ damage, prolonged sedation necessitating reversal, respiratory compromise necessitating ventilatory assistance and intubation and vomiting   Alternatives discussed:  Analgesia without sedation, anxiolysis and regional anesthesia Universal protocol:    Procedure explained and questions answered to patient or proxy's satisfaction: yes     Relevant documents present and verified: yes     Test results available and properly labeled: yes     Imaging studies available: yes     Required blood products, implants, devices, and special  equipment available: yes     Site/side marked: yes     Immediately prior to procedure a time out was called: yes     Patient identity confirmation method:  Verbally with patient Indications:    Procedure performed:  Incision and drainage   Procedure necessitating sedation performed by:  Physician performing sedation   Intended level of sedation:  Deep Pre-sedation assessment:    Time since last food or drink:  8 hours   ASA classification: class 1 - normal, healthy patient     Neck mobility: normal     Mouth opening:  3 or more finger widths   Thyromental distance:  4 finger widths   Mallampati score:  I - soft palate, uvula, fauces, pillars visible   Pre-sedation assessments completed and reviewed: airway patency, cardiovascular function, hydration status, mental status, nausea/vomiting, pain level, respiratory function and temperature     Pre-sedation assessment completed:  05/06/2018 3:49 AM Immediate pre-procedure details:    Reassessment: Patient reassessed  immediately prior to procedure     Reviewed: vital signs, relevant labs/tests and NPO status     Verified: bag valve mask available, emergency equipment available, intubation equipment available, IV patency confirmed, oxygen available and suction available   Procedure details (see MAR for exact dosages):    Preoxygenation:  Nasal cannula   Sedation:  Propofol   Intra-procedure monitoring:  Blood pressure monitoring, cardiac monitor, continuous pulse oximetry, frequent LOC assessments and frequent vital sign checks   Intra-procedure events: none     Total Provider sedation time (minutes):  18 Post-procedure details:    Post-sedation assessment completed:  05/06/2018 4:49 AM   Attendance: Constant attendance by certified staff until patient recovered     Recovery: Patient returned to pre-procedure baseline     Post-sedation assessments completed and reviewed: airway patency, cardiovascular function, hydration status, mental status,  nausea/vomiting, pain level, respiratory function and temperature     Patient is stable for discharge or admission: yes     Patient tolerance:  Tolerated well, no immediate complications .Marland KitchenIncision and Drainage Date/Time: 05/06/2018 4:49 AM Performed by: Merrily Brittle, MD Authorized by: Merrily Brittle, MD   Consent:    Consent obtained:  Verbal   Consent given by:  Patient   Risks discussed:  Bleeding, infection, incomplete drainage and pain   Alternatives discussed:  Alternative treatment, delayed treatment and observation Location:    Type:  Abscess   Size:  6 cm   Location:  Lower extremity   Lower extremity location:  Buttock   Buttock location:  R buttock Pre-procedure details:    Skin preparation:  Chloraprep Sedation:    Sedation type:  Deep Anesthesia (see MAR for exact dosages):    Anesthesia method:  Local infiltration   Local anesthetic:  Bupivacaine 0.5% w/o epi Procedure type:    Complexity:  Complex Procedure details:    Needle aspiration: no     Incision types:  Single straight   Incision depth:  Submucosal   Scalpel blade:  11   Wound management:  Probed and deloculated, irrigated with saline, extensive cleaning and debrided   Drainage:  Bloody and purulent   Drainage amount:  Moderate   Wound treatment:  Wound left open   Packing materials:  None Post-procedure details:    Patient tolerance of procedure:  Tolerated well, no immediate complications    Critical Care performed: no  ____________________________________________   INITIAL IMPRESSION / ASSESSMENT AND PLAN / ED COURSE  Pertinent labs & imaging results that were available during my care of the patient were reviewed by me and considered in my medical decision making (see chart for details).   The patient arrives extremely uncomfortable appearing tachycardic with a spontaneously draining abscess with a necrotic core that will require debridement.  She is in severe pain and I can barely  touch her without her screaming.  She was consented for propofol sedation and sedation performed without complication.  Please see nursing notes for total dosage.  During the sedation I cleansed the area with chlorhexidine and use bupivacaine as an anesthetic and then performed an incision and drainage and debrided the wound.  She tolerated the procedure well and her pain is well controlled on discharge.  Her boyfriend is driving home.  I will cover her with Bactrim given the overlying cellulitis and a 2-day wound check.  The patient verbalizes understanding and agree with plan.      ____________________________________________   FINAL CLINICAL IMPRESSION(S) / ED DIAGNOSES  Final diagnoses:  Abscess of buttock, right  Cellulitis of buttock      NEW MEDICATIONS STARTED DURING THIS VISIT:  New Prescriptions   OXYCODONE-ACETAMINOPHEN (PERCOCET/ROXICET) 5-325 MG TABLET    Take 1 tablet by mouth every 4 (four) hours as needed for severe pain.   SULFAMETHOXAZOLE-TRIMETHOPRIM (BACTRIM DS,SEPTRA DS) 800-160 MG TABLET    Take 1 tablet by mouth 2 (two) times daily for 10 days.     Note:  This document was prepared using Dragon voice recognition software and may include unintentional dictation errors.     Merrily Brittle, MD 05/06/18 [phone]

## 2018-05-06 NOTE — ED Triage Notes (Signed)
Patient c/o abscess to posterior right thigh X 1 week that opened today. Patient has bloody drainage to site.

## 2018-08-10 ENCOUNTER — Inpatient Hospital Stay (HOSPITAL_COMMUNITY): Payer: Self-pay

## 2018-08-10 ENCOUNTER — Other Ambulatory Visit: Payer: Self-pay

## 2018-08-10 ENCOUNTER — Encounter (HOSPITAL_COMMUNITY): Payer: Self-pay

## 2018-08-10 ENCOUNTER — Inpatient Hospital Stay (HOSPITAL_COMMUNITY)
Admission: AD | Admit: 2018-08-10 | Discharge: 2018-08-10 | Disposition: A | Discharge status: Home / Self Care | Payer: Self-pay | Source: Ambulatory Visit | Attending: Family Medicine | Admitting: Family Medicine

## 2018-08-10 DIAGNOSIS — R109 Unspecified abdominal pain: Secondary | ICD-10-CM

## 2018-08-10 DIAGNOSIS — R1030 Lower abdominal pain, unspecified: Secondary | ICD-10-CM | POA: Insufficient documentation

## 2018-08-10 DIAGNOSIS — F17210 Nicotine dependence, cigarettes, uncomplicated: Secondary | ICD-10-CM | POA: Insufficient documentation

## 2018-08-10 DIAGNOSIS — R110 Nausea: Secondary | ICD-10-CM | POA: Insufficient documentation

## 2018-08-10 LAB — CBC WITH DIFFERENTIAL/PLATELET
BASOS PCT: 0 %
Basophils Absolute: 0 10*3/uL (ref 0.0–0.1)
EOS ABS: 0.1 10*3/uL (ref 0.0–0.7)
Eosinophils Relative: 1 %
HCT: 43.5 % (ref 36.0–46.0)
HEMOGLOBIN: 14.5 g/dL (ref 12.0–15.0)
LYMPHS ABS: 1.7 10*3/uL (ref 0.7–4.0)
Lymphocytes Relative: 23 %
MCH: 28.8 pg (ref 26.0–34.0)
MCHC: 33.3 g/dL (ref 30.0–36.0)
MCV: 86.5 fL (ref 78.0–100.0)
Monocytes Absolute: 0.5 10*3/uL (ref 0.1–1.0)
Monocytes Relative: 7 %
NEUTROS PCT: 69 %
Neutro Abs: 4.9 10*3/uL (ref 1.7–7.7)
Platelets: 253 10*3/uL (ref 150–400)
RBC: 5.03 MIL/uL (ref 3.87–5.11)
RDW: 14.2 % (ref 11.5–15.5)
WBC: 7.2 10*3/uL (ref 4.0–10.5)

## 2018-08-10 LAB — URINALYSIS, ROUTINE W REFLEX MICROSCOPIC
Bilirubin Urine: NEGATIVE
Glucose, UA: NEGATIVE mg/dL
KETONES UR: NEGATIVE mg/dL
LEUKOCYTES UA: NEGATIVE
NITRITE: NEGATIVE
PH: 7 (ref 5.0–8.0)
Protein, ur: NEGATIVE mg/dL
Specific Gravity, Urine: 1.01 (ref 1.005–1.030)

## 2018-08-10 LAB — WET PREP, GENITAL
CLUE CELLS WET PREP: NONE SEEN
SPERM: NONE SEEN
TRICH WET PREP: NONE SEEN
YEAST WET PREP: NONE SEEN

## 2018-08-10 LAB — POCT PREGNANCY, URINE: Preg Test, Ur: NEGATIVE

## 2018-08-10 MED ORDER — KETOROLAC TROMETHAMINE 60 MG/2ML IM SOLN
60.0000 mg | Freq: Once | INTRAMUSCULAR | Status: AC
Start: 2018-08-10 — End: 2018-08-10
  Administered 2018-08-10: 60 mg via INTRAMUSCULAR
  Filled 2018-08-10: qty 2

## 2018-08-10 NOTE — MAU Note (Signed)
Pt states that she started having lower abdominal pain last night.   Pt. States that intercourse and bowel movement are painful.   Pt states that she is pron to cysts on her ovaries, but does not think that is where the pain is coming from this time.

## 2018-08-10 NOTE — MAU Provider Note (Signed)
History     CSN: [id number]  Arrival date and time: 08/10/18 1605   First Provider Initiated Contact with Patient 08/10/18 1724      Chief Complaint  Patient presents with  . Abdominal Pain   HPI   Laurie Richards is a 22 y.o. female [phone] with a history of appendectomy, here in MAU with RLQ pain that started 2 weeks ago. Says the pain has become worse today. She rates her pain 8/10. She tried taking tylenol at home which did not work.  The pain starts in her RLQ and radiates around to her lower back and down her leg. The pain worsens after intercourse, no pain during intercourse, however it worsens after. She attests to nausea, however no vomiting. Reports fever of 100.1 last night.  History of recurrent miscarriage, desires pregnancy.  Currently menstruating, no history of painful periods.   OB History    Gravida  4   Para      Term      Preterm      AB  4   Living  0     SAB  4   TAB      Ectopic      Multiple      Live Births              Past Medical History:  Diagnosis Date  . Asthma   . Irregular heartbeat    a. occasional skipped beats since age 40.  . Marijuana abuse    a. smokes 1-2 x/wk.  Marland Kitchen PAF (paroxysmal atrial fibrillation) (HCC)    a. Dx 09/2017 in setting of mild hyperthyroidism.  . Tobacco abuse    a. smoking cigarettes since age 3.  . Tooth abscess 09/2017    Past Surgical History:  Procedure Laterality Date  . APPENDECTOMY    . MOUTH SURGERY      Family History  Problem Relation Age of Onset  . Heart failure Mother   . Coronary artery disease Mother        Status post MI and stenting in her 30s  . Diabetes Other   . Coronary artery disease Maternal Uncle        Status post stenting at a young age  . Coronary artery disease Maternal Uncle        Status post stenting at a young age    Social History   Tobacco Use  . Smoking status: Current Every Day Smoker    Packs/day: 0.50    Types: Cigarettes  . Smokeless  tobacco: Never Used  Substance Use Topics  . Alcohol use: Yes    Comment: socially  . Drug use: Not Currently    Types: Marijuana    Allergies:  Allergies  Allergen Reactions  . Acetaminophen-Codeine Itching and Swelling    Rash, vomiting if takes on empty stomach    Medications Prior to Admission  Medication Sig Dispense Refill Last Dose  . acetaminophen (TYLENOL) 500 MG tablet Take 500 mg by mouth once as needed. For pain    PRN at PRN  . diltiazem (CARDIZEM) 30 MG tablet Take 1 tablet (30 mg total) as needed by mouth. If HR more than 100 30 tablet 0   . metoprolol succinate (TOPROL-XL) 25 MG 24 hr tablet Take 0.5 tablets (12.5 mg total) daily by mouth. 30 tablet 0   . oxyCODONE-acetaminophen (PERCOCET/ROXICET) 5-325 MG tablet Take 1 tablet by mouth every 4 (four) hours as needed for severe pain. 11 tablet  0    Results for orders placed or performed during the hospital encounter of 08/10/18 (from the past 48 hour(s))  Urinalysis, Routine w reflex microscopic     Status: Abnormal   Collection Time: 08/10/18  4:21 PM  Result Value Ref Range   Color, Urine YELLOW YELLOW   APPearance HAZY (A) CLEAR   Specific Gravity, Urine 1.010 1.005 - 1.030   pH 7.0 5.0 - 8.0   Glucose, UA NEGATIVE NEGATIVE mg/dL   Hgb urine dipstick LARGE (A) NEGATIVE   Bilirubin Urine NEGATIVE NEGATIVE   Ketones, ur NEGATIVE NEGATIVE mg/dL   Protein, ur NEGATIVE NEGATIVE mg/dL   Nitrite NEGATIVE NEGATIVE   Leukocytes, UA NEGATIVE NEGATIVE   RBC / HPF 0-5 0 - 5 RBC/hpf   WBC, UA 0-5 0 - 5 WBC/hpf   Bacteria, UA RARE (A) NONE SEEN   Squamous Epithelial / LPF 11-20 0 - 5   Mucus PRESENT     Comment: Performed at [facility], [address], [place], [place] [address]  Pregnancy, urine POC     Status: None   Collection Time: 08/10/18  4:23 PM  Result Value Ref Range   Preg Test, Ur NEGATIVE NEGATIVE    Comment:        THE SENSITIVITY OF THIS METHODOLOGY IS >24 mIU/mL   CBC with Differential      Status: None   Collection Time: 08/10/18  6:24 PM  Result Value Ref Range   WBC 7.2 4.0 - 10.5 K/uL   RBC 5.03 3.87 - 5.11 MIL/uL   Hemoglobin 14.5 12.0 - 15.0 g/dL   HCT 40.9 81.1 - 91.4 %   MCV 86.5 78.0 - 100.0 fL   MCH 28.8 26.0 - 34.0 pg   MCHC 33.3 30.0 - 36.0 g/dL   RDW 78.2 95.6 - 21.3 %   Platelets 253 150 - 400 K/uL   Neutrophils Relative % 69 %   Neutro Abs 4.9 1.7 - 7.7 K/uL   Lymphocytes Relative 23 %   Lymphs Abs 1.7 0.7 - 4.0 K/uL   Monocytes Relative 7 %   Monocytes Absolute 0.5 0.1 - 1.0 K/uL   Eosinophils Relative 1 %   Eosinophils Absolute 0.1 0.0 - 0.7 K/uL   Basophils Relative 0 %   Basophils Absolute 0.0 0.0 - 0.1 K/uL    Comment: Performed at [facility], [address], [place], [place] [address]  Wet prep, genital     Status: Abnormal   Collection Time: 08/10/18  7:30 PM  Result Value Ref Range   Yeast Wet Prep HPF POC NONE SEEN NONE SEEN   Trich, Wet Prep NONE SEEN NONE SEEN   Clue Cells Wet Prep HPF POC NONE SEEN NONE SEEN   WBC, Wet Prep HPF POC MODERATE (A) NONE SEEN    Comment: MANY BACTERIA SEEN   Sperm NONE SEEN     Comment: Performed at [facility], [address], [place], [place] [address]   US Pelvic Complete With Transvaginal  Result Date: 08/10/2018 CLINICAL DATA:  Lower abdominal pain, RIGHT greater than LEFT. EXAM: TRANSABDOMINAL AND TRANSVAGINAL ULTRASOUND OF PELVIS TECHNIQUE: Both transabdominal and transvaginal ultrasound examinations of the pelvis were performed. Transabdominal technique was performed for global imaging of the pelvis including uterus, ovaries, adnexal regions, and pelvic cul-de-sac. It was necessary to proceed with endovaginal exam following the transabdominal exam to visualize the endometrial complex and adnexal structures to an adequate degree. COMPARISON:  None FINDINGS: Uterus Measurements: 6.8 x 2.9 x  4 cm. No fibroids or other mass visualized. Endometrium Thickness: 5 mm.  No focal abnormality visualized.  Right ovary Measurements: 2.2 x 1.7 x 1.6 cm. Normal appearance/no adnexal mass. Left ovary Measurements: 2 x 1.4 x 2 cm.  Normal appearance/no adnexal mass. Other findings No abnormal free fluid. IMPRESSION: Normal pelvic ultrasound. Electronically Signed   By: Bary Richard M.D.   On: 08/10/2018 19:59   Review of Systems  Constitutional: Positive for fever (Yesterday 100.1 (reported)).  Gastrointestinal: Positive for abdominal pain, diarrhea (2-3 episodes ) and nausea. Negative for vomiting.  Genitourinary: Positive for pelvic pain. Negative for dysuria, flank pain and urgency.  Musculoskeletal: Positive for back pain.   Physical Exam   Blood pressure 125/73, pulse 90, temperature 98.5 F (36.9 C), resp. rate 12, weight 53 kg, last menstrual period 08/09/2018, SpO2 100 %, unknown if currently breastfeeding.  Physical Exam  Constitutional: She is oriented to person, place, and time. She appears well-developed and well-nourished. No distress.  HENT:  Head: Normocephalic.  Eyes: Pupils are equal, round, and reactive to light.  GI: Soft. Normal appearance. There is tenderness in the right lower quadrant and suprapubic area. There is guarding. There is no rigidity, no rebound and no CVA tenderness.  Genitourinary:  Genitourinary Comments: Bimanual exam: Cervix closed, no CMT. Small about of dark red blood noted on exam glove.  Uterus non tender, normal size Right Adnexal  Tenderness , no masses bilaterally GC/Chlam, wet prep done Chaperone present for exam.   Musculoskeletal: Normal range of motion.  Neurological: She is alert and oriented to person, place, and time.  Skin: Skin is warm. She is not diaphoretic.  Psychiatric: Her behavior is normal.    MAU Course  Procedures  None  MDM  Patient reports fever at home; some guarding on abdominal exam today >Pelvic US ordered. CBC with diff> stable without leukocytosis  Wet prep & GC collected  Toradol 60 mg given IM, pain down from  8/10 to 6/10 Hematuria in urine likely 2/2 to vaginal bleeding, patient is currently on her menstrual cycle. Patient conversing with NP and boyfriend, stable for DC   Assessment and Plan   A:  1. Abdominal pain of unknown etiology; unlikely a GYN condition  2. Nausea   3. Abdominal pain     P:  Discharge home in stable condition No pain medication was given at this visit Ok to use OTC ibuprofen as directed on the bottle If symptoms worsen, go to [name] or [facility] ED  Abdulkarim Eberlin, Harolyn Rutherford, NP 08/12/2018 8:46 AM

## 2018-08-10 NOTE — Discharge Instructions (Signed)

## 2018-08-10 NOTE — MAU Provider Note (Signed)
History     CSN: [id number]  Arrival date and time: 08/10/18 1605   First Provider Initiated Contact with Patient 08/10/18 1724      Chief Complaint  Patient presents with  . Abdominal Pain   HPI: Laurie Richards is a 22 y.o female presenting with lower abdominal pain. She states this pain started around 0200. She reports she has had pain for the past 2 weeks but it did not become severe until this morning. She reports pain approximately 5 minutes after intercourse for the past 2 weeks.She states it initially was spondaic but became constant approximately two hours ago. She points to the right lower quadrant when asked where the pain is and states"it goes into my back and a little down my leg". Patient reports taking Tylenol at 0900 with no relief in pain. She reports she was able to tolerate PO intake until approximately 2 hours ago when "the pain got too bad". She reports that the pain is a 9/10. She states she experiences pain with intercourse and bowel movement. Patient also report diarrhea x2 today and nausea. Patient denies hematochezia, hematuria, dysuria, chills, and vaginal discharge. She reports her LMP was "sometime last month". She reports she had a "toothache" that required antibiotics that she finished 3 days ago. She also reports she had a fever last night and 3 days ago, patient states" I think 3 days ago it was 99 and last night it was 100.1". She reports that her period started while she was in office.   OB History    Gravida  4   Para      Term      Preterm      AB  4   Living  0     SAB  4   TAB      Ectopic      Multiple      Live Births              Past Medical History:  Diagnosis Date  . Asthma   . Irregular heartbeat    a. occasional skipped beats since age 80.  . Marijuana abuse    a. smokes 1-2 x/wk.  Marland Kitchen PAF (paroxysmal atrial fibrillation) (HCC)    a. Dx 09/2017 in setting of mild hyperthyroidism.  . Tobacco abuse    a. smoking  cigarettes since age 18.  . Tooth abscess 09/2017    Past Surgical History:  Procedure Laterality Date  . APPENDECTOMY    . MOUTH SURGERY      Family History  Problem Relation Age of Onset  . Heart failure Mother   . Coronary artery disease Mother        Status post MI and stenting in her 30s  . Diabetes Other   . Coronary artery disease Maternal Uncle        Status post stenting at a young age  . Coronary artery disease Maternal Uncle        Status post stenting at a young age    Social History   Tobacco Use  . Smoking status: Current Every Day Smoker    Packs/day: 0.50    Types: Cigarettes  . Smokeless tobacco: Never Used  Substance Use Topics  . Alcohol use: Yes    Comment: socially  . Drug use: Not Currently    Types: Marijuana    Allergies:  Allergies  Allergen Reactions  . Acetaminophen-Codeine Itching and Swelling    Rash, vomiting if takes on  empty stomach    Medications Prior to Admission  Medication Sig Dispense Refill Last Dose  . acetaminophen (TYLENOL) 500 MG tablet Take 500 mg by mouth once as needed. For pain    PRN at PRN  . diltiazem (CARDIZEM) 30 MG tablet Take 1 tablet (30 mg total) as needed by mouth. If HR more than 100 30 tablet 0   . metoprolol succinate (TOPROL-XL) 25 MG 24 hr tablet Take 0.5 tablets (12.5 mg total) daily by mouth. 30 tablet 0   . oxyCODONE-acetaminophen (PERCOCET/ROXICET) 5-325 MG tablet Take 1 tablet by mouth every 4 (four) hours as needed for severe pain. 11 tablet 0     Review of Systems  Gastrointestinal: Positive for abdominal pain (RLQ pain), diarrhea (2-3 times today) and nausea. Negative for blood in stool and vomiting.  Genitourinary: Positive for dyspareunia. Negative for dysuria, hematuria, vaginal bleeding and vaginal discharge.  Musculoskeletal: Positive for gait problem (low back, right side).   Physical Exam   Blood pressure 125/73, pulse 90, temperature 98.5 F (36.9 C), resp. rate 12, weight 53 kg,  last menstrual period 08/09/2018, SpO2 100 %, unknown if currently breastfeeding.  Physical Exam  Cardiovascular: Normal rate, regular rhythm and normal heart sounds.  Respiratory: Effort normal and breath sounds normal.  GI: There is tenderness (RLQ). There is guarding (RLQ guarding ).    MAU Course  Procedures  MDM CBC, no WBC elevation making infection less likely.  Given IM Toradol, patient reported her pain improved from an 8/10 to 6/10. Transvaginal US ordered due to physical exam finding of pain during bimanual exam of the right ovary.    Assessment and Plan  Nausea - Plan: US PELVIC COMPLETE WITH TRANSVAGINAL, US PELVIC COMPLETE WITH TRANSVAGINAL  Abdominal pain - Plan: US PELVIC COMPLETE WITH TRANSVAGINAL, US PELVIC COMPLETE WITH TRANSVAGINAL  Plan is to continue patient monitoring for changes in pain level while awaiting [place] results.   Charyl Dancer 08/10/2018, 5:33 PM

## 2018-08-11 LAB — GC/CHLAMYDIA PROBE AMP (~~LOC~~) NOT AT ARMC
Chlamydia: NEGATIVE
NEISSERIA GONORRHEA: NEGATIVE

## 2019-04-20 ENCOUNTER — Other Ambulatory Visit: Payer: Self-pay

## 2019-04-20 ENCOUNTER — Ambulatory Visit (INDEPENDENT_AMBULATORY_CARE_PROVIDER_SITE_OTHER): Payer: Medicaid Other

## 2019-04-20 DIAGNOSIS — Z3202 Encounter for pregnancy test, result negative: Secondary | ICD-10-CM | POA: Diagnosis not present

## 2019-04-20 DIAGNOSIS — N912 Amenorrhea, unspecified: Secondary | ICD-10-CM

## 2019-04-20 LAB — POCT URINE PREGNANCY: Preg Test, Ur: NEGATIVE

## 2019-04-20 NOTE — Progress Notes (Signed)
Laurie Richards presents today for UPT. She has no unusual complaints and complains of nausea Breast Tenderness . LMP: 03/10/19     OBJECTIVE: Appears well, in no apparent distress.  OB History    Gravida  4   Para      Term      Preterm      AB  4   Living  0     SAB  4   TAB      Ectopic      Multiple      Live Births             Home UPT Result:pt states test at home were positive  In-Office UPT result:  Negative x2 I have reviewed the patient's medical, obstetrical, social, and family histories, and medications.   ASSESSMENT: negative pregnancy test  PLAN Prenatal care to be completed at: Return to office in 2-4 weeks for repeat test. Pt advised to go to hospital for any pain and vaginal bleeding.

## 2019-11-10 DIAGNOSIS — U071 COVID-19: Secondary | ICD-10-CM

## 2019-11-10 HISTORY — DX: COVID-19: U07.1

## 2020-10-16 ENCOUNTER — Encounter (HOSPITAL_COMMUNITY): Payer: Self-pay | Admitting: Emergency Medicine

## 2020-10-16 ENCOUNTER — Ambulatory Visit (HOSPITAL_COMMUNITY)
Admission: EM | Admit: 2020-10-16 | Discharge: 2020-10-16 | Disposition: A | Discharge status: Home / Self Care | Payer: Self-pay | Attending: Family Medicine | Admitting: Family Medicine

## 2020-10-16 ENCOUNTER — Other Ambulatory Visit: Payer: Self-pay

## 2020-10-16 DIAGNOSIS — K0889 Other specified disorders of teeth and supporting structures: Secondary | ICD-10-CM

## 2020-10-16 MED ORDER — AMOXICILLIN-POT CLAVULANATE 875-125 MG PO TABS: 1.0000 | ORAL_TABLET | Freq: Two times a day (BID) | ORAL | 0 refills | Status: DC

## 2020-10-16 MED ORDER — LIDOCAINE VISCOUS HCL 2 % MT SOLN: 10.0000 mL | OROMUCOSAL | 0 refills | Status: DC | PRN

## 2020-10-16 NOTE — ED Triage Notes (Signed)
Pt presents with dental pain xs 3-4 days. States the pain and swelling has gotten worse over the past 2 days. States too ibuprofen approx 2 hours ago with some relief.

## 2020-10-16 NOTE — ED Provider Notes (Signed)
[facility]    CSN: [id number] Arrival date & time: 10/16/20  1130      History   Chief Complaint Chief Complaint  Patient presents with  . Dental Pain    HPI Laurie Richards is a 24 y.o. female.   Here today with 3-4 days of worsening right upper and lower jaw pain, facial swelling. Has been trying OTC pain relievers without much relief. Denies fever, chills, drainage from area, headache, CP, SOB. States she's had this issue in the past as well. Does not have a dentist.      Past Medical History:  Diagnosis Date  . Asthma   . Irregular heartbeat    a. occasional skipped beats since age 54.  . Marijuana abuse    a. smokes 1-2 x/wk.  Marland Kitchen PAF (paroxysmal atrial fibrillation) (HCC)    a. Dx 09/2017 in setting of mild hyperthyroidism.  . Tobacco abuse    a. smoking cigarettes since age 72.  . Tooth abscess 09/2017    Patient Active Problem List   Diagnosis Date Noted  . Smoker 10/10/2017  . Hyperthyroidism 10/10/2017  . A-fib (HCC) 09/22/2017    Past Surgical History:  Procedure Laterality Date  . APPENDECTOMY    . MOUTH SURGERY      OB History    Gravida  4   Para      Term      Preterm      AB  4   Living  0     SAB  4   TAB      Ectopic      Multiple      Live Births               Home Medications    Prior to Admission medications   Medication Sig Start Date End Date Taking? Authorizing Provider  acetaminophen (TYLENOL) 500 MG tablet Take 500 mg by mouth once as needed. For pain     [provider]  amoxicillin-clavulanate (AUGMENTIN) 875-125 MG tablet Take 1 tablet by mouth every 12 (twelve) hours. 10/16/20   Particia Nearing, PA-C  diltiazem (CARDIZEM) 30 MG tablet Take 1 tablet (30 mg total) as needed by mouth. If HR more than 100 09/23/17 09/23/18  Katha Hamming, MD  lidocaine (XYLOCAINE) 2 % solution Use as directed 10 mLs in the mouth or throat as needed for mouth pain. 10/16/20   Particia Nearing, PA-C  metoprolol succinate (TOPROL-XL) 25 MG 24 hr tablet Take 0.5 tablets (12.5 mg total) daily by mouth. 09/24/17   Katha Hamming, MD  oxyCODONE-acetaminophen (PERCOCET/ROXICET) 5-325 MG tablet Take 1 tablet by mouth every 4 (four) hours as needed for severe pain. 05/06/18   Merrily Brittle, MD    Family History Family History  Problem Relation Age of Onset  . Heart failure Mother   . Coronary artery disease Mother        Status post MI and stenting in her 30s  . Diabetes Other   . Coronary artery disease Maternal Uncle        Status post stenting at a young age  . Coronary artery disease Maternal Uncle        Status post stenting at a young age    Social History Social History   Tobacco Use  . Smoking status: Current Every Day Smoker    Packs/day: 0.50    Types: Cigarettes  . Smokeless tobacco: Never Used  Vaping Use  . Vaping  Use: Never used  Substance Use Topics  . Alcohol use: Yes    Comment: socially  . Drug use: Not Currently    Types: Marijuana     Allergies   Acetaminophen-codeine   Review of Systems Review of Systems PER HPI   Physical Exam Triage Vital Signs ED Triage Vitals  Enc Vitals Group     BP 10/16/20 1227 116/72     Pulse Rate 10/16/20 1227 71     Resp 10/16/20 1227 16     Temp 10/16/20 1227 98 F (36.7 C)     Temp Source 10/16/20 1227 Oral     SpO2 10/16/20 1227 100 %     Weight --      Height --      Head Circumference --      Peak Flow --      Pain Score 10/16/20 1225 5     Pain Loc --      Pain Edu? --      Excl. in GC? --    No data found.  Updated Vital Signs BP 116/72 (BP Location: Right Arm)   Pulse 71   Temp 98 F (36.7 C) (Oral)   Resp 16   LMP 10/09/2020   SpO2 100%   Visual Acuity Right Eye Distance:   Left Eye Distance:   Bilateral Distance:    Right Eye Near:   Left Eye Near:    Bilateral Near:     Physical Exam Vitals and nursing note reviewed.  Constitutional:      Appearance:  Normal appearance. She is not ill-appearing.  HENT:     Head: Atraumatic.     Right Ear: Tympanic membrane normal.     Left Ear: Tympanic membrane normal.     Nose: Nose normal.     Mouth/Throat:     Mouth: Mucous membranes are moist.     Comments: Large area of decay in broken lower right side molar, gingival erythema and edema top right molar region. No active drainage from either Eyes:     Extraocular Movements: Extraocular movements intact.     Conjunctiva/sclera: Conjunctivae normal.  Cardiovascular:     Rate and Rhythm: Normal rate and regular rhythm.     Heart sounds: Normal heart sounds.  Pulmonary:     Effort: Pulmonary effort is normal.     Breath sounds: Normal breath sounds.  Musculoskeletal:        General: Normal range of motion.     Cervical back: Normal range of motion and neck supple.  Lymphadenopathy:     Cervical: No cervical adenopathy.  Skin:    General: Skin is warm and dry.  Neurological:     Mental Status: She is alert and oriented to person, place, and time.  Psychiatric:        Mood and Affect: Mood normal.        Thought Content: Thought content normal.        Judgment: Judgment normal.      UC Treatments / Results  Labs (all labs ordered are listed, but only abnormal results are displayed) Labs Reviewed - No data to display  EKG   Radiology No results found.  Procedures Procedures (including critical care time)  Medications Ordered in UC Medications - No data to display  Initial Impression / Assessment and Plan / UC Course  I have reviewed the triage vital signs and the nursing notes.  Pertinent labs & imaging results that were available during my  care of the patient were reviewed by me and considered in my medical decision making (see chart for details).     Tx with augmentin, viscous lidocaine, salt water gargles, dental f/u as soon as able. Return precautions reviewed. Work note given.   Final Clinical Impressions(s) / UC  Diagnoses   Final diagnoses:  Pain, dental   Discharge Instructions   None    ED Prescriptions    Medication Sig Dispense Auth. Provider   amoxicillin-clavulanate (AUGMENTIN) 875-125 MG tablet Take 1 tablet by mouth every 12 (twelve) hours. 14 tablet Particia Nearing, [place]   lidocaine (XYLOCAINE) 2 % solution Use as directed 10 mLs in the mouth or throat as needed for mouth pain. 100 mL Particia Nearing, [place]     PDMP not reviewed this encounter.   Particia Nearing, [place] 10/16/20 1323

## 2020-12-23 ENCOUNTER — Other Ambulatory Visit: Payer: Self-pay

## 2020-12-23 ENCOUNTER — Ambulatory Visit (HOSPITAL_COMMUNITY)
Admission: EM | Admit: 2020-12-23 | Discharge: 2020-12-23 | Disposition: A | Discharge status: Home / Self Care | Payer: Medicaid Other

## 2020-12-23 ENCOUNTER — Encounter (HOSPITAL_COMMUNITY): Payer: Self-pay | Admitting: Emergency Medicine

## 2020-12-23 DIAGNOSIS — K047 Periapical abscess without sinus: Secondary | ICD-10-CM

## 2020-12-23 DIAGNOSIS — K029 Dental caries, unspecified: Secondary | ICD-10-CM

## 2020-12-23 MED ORDER — AMOXICILLIN-POT CLAVULANATE 875-125 MG PO TABS: 1.0000 | ORAL_TABLET | Freq: Two times a day (BID) | ORAL | 0 refills | Status: DC

## 2020-12-23 MED ORDER — MELOXICAM 15 MG PO TABS: 15.0000 mg | ORAL_TABLET | Freq: Every day | ORAL | 1 refills | Status: DC

## 2020-12-23 NOTE — ED Triage Notes (Signed)
Left dental pain radiating to left eye and left ear.  Patient reports poor dental care and admits a history of drug abuse contributing to current situation.  Patient has been in recovery

## 2020-12-23 NOTE — Discharge Instructions (Addendum)
Make sure you schedule an appointment with a dentist/dental surgeon as soon as possible.  You may try some of the resources below.     [facility] [phone] extension [phone]  Dr. Civils [phone] [address]  [facility] [phone] [address]  [facility] [phone] extension [phone] [place], [place], [address] First come first serve for the first 10 clients.  May do simple extractions only, no wisdom teeth or surgery.  You may try the second for Thursday of the month starting at 6:30 AM.  [employer] You may call the school to see if they are still helping to provide dental care for emergent cases.  

## 2020-12-23 NOTE — ED Provider Notes (Signed)
[name]   MRN: [id number] DOB: 05/26/1996  Subjective:   Laurie Richards is a 25 y.o. female presenting for acute on chronic recurrent left-sided dental pain, facial pain that radiates upwards to her temple.  Patient has a history of severe dental problems but has not had dental insurance to get this taken care of.  Has been using Tylenol, ibuprofen, Goody powders with minimal relief.  She does have a history of drug use.  Denies taking chronic medications.  Allergies  Allergen Reactions  . Acetaminophen-Codeine Itching and Swelling    Rash, vomiting if takes on empty stomach    Past Medical History:  Diagnosis Date  . Asthma   . Irregular heartbeat    a. occasional skipped beats since age 62.  . Marijuana abuse    a. smokes 1-2 x/wk.  Marland Kitchen PAF (paroxysmal atrial fibrillation) (HCC)    a. Dx 09/2017 in setting of mild hyperthyroidism.  . Tobacco abuse    a. smoking cigarettes since age 46.  . Tooth abscess 09/2017     Past Surgical History:  Procedure Laterality Date  . APPENDECTOMY    . MOUTH SURGERY      Family History  Problem Relation Age of Onset  . Heart failure Mother   . Coronary artery disease Mother        Status post MI and stenting in her 30s  . Diabetes Other   . Coronary artery disease Maternal Uncle        Status post stenting at a young age  . Coronary artery disease Maternal Uncle        Status post stenting at a young age    Social History   Tobacco Use  . Smoking status: Current Every Day Smoker    Packs/day: 0.50    Types: Cigarettes  . Smokeless tobacco: Never Used  Vaping Use  . Vaping Use: Never used  Substance Use Topics  . Alcohol use: Yes    Comment: socially  . Drug use: Not Currently    Types: Marijuana    ROS   Objective:   Vitals: BP 109/67 (BP Location: Left Arm)   Pulse 72   Temp 98.3 F (36.8 C) (Oral)   Resp 18   LMP 12/10/2020   SpO2 100%   Physical Exam Constitutional:       General: She is not in acute distress.    Appearance: Normal appearance. She is well-developed. She is not ill-appearing.  HENT:     Head: Normocephalic and atraumatic.      Nose: Nose normal.     Mouth/Throat:     Mouth: Mucous membranes are moist.     Pharynx: Oropharynx is clear.   Eyes:     General: No scleral icterus.    Extraocular Movements: Extraocular movements intact.     Pupils: Pupils are equal, round, and reactive to light.  Cardiovascular:     Rate and Rhythm: Normal rate.  Pulmonary:     Effort: Pulmonary effort is normal.  Skin:    General: Skin is warm and dry.  Neurological:     General: No focal deficit present.     Mental Status: She is alert and oriented to person, place, and time.  Psychiatric:        Mood and Affect: Mood normal.        Behavior: Behavior normal.     Assessment and Plan :   PDMP not reviewed this encounter.  1.  Dental infection   2. Pain due to dental caries     Start Augmentin for dental infection/abscess, use naproxen for pain and inflammation. Emphasized need for dental surgeon consult, provided her with information to a few practices. Counseled patient on potential for adverse effects with medications prescribed/recommended today, strict ER and return-to-clinic precautions discussed, patient verbalized understanding.    Wallis Bamberg, [place] 12/23/20 1123

## 2021-01-16 ENCOUNTER — Encounter (HOSPITAL_COMMUNITY): Payer: Self-pay | Admitting: Emergency Medicine

## 2021-01-16 ENCOUNTER — Ambulatory Visit (HOSPITAL_COMMUNITY)
Admission: EM | Admit: 2021-01-16 | Discharge: 2021-01-16 | Disposition: A | Discharge status: Home / Self Care | Payer: Self-pay | Attending: Urgent Care | Admitting: Urgent Care

## 2021-01-16 ENCOUNTER — Other Ambulatory Visit: Payer: Self-pay

## 2021-01-16 DIAGNOSIS — Z87891 Personal history of nicotine dependence: Secondary | ICD-10-CM | POA: Insufficient documentation

## 2021-01-16 DIAGNOSIS — Z20822 Contact with and (suspected) exposure to covid-19: Secondary | ICD-10-CM | POA: Insufficient documentation

## 2021-01-16 DIAGNOSIS — J069 Acute upper respiratory infection, unspecified: Secondary | ICD-10-CM | POA: Insufficient documentation

## 2021-01-16 DIAGNOSIS — R519 Headache, unspecified: Secondary | ICD-10-CM

## 2021-01-16 DIAGNOSIS — J029 Acute pharyngitis, unspecified: Secondary | ICD-10-CM

## 2021-01-16 DIAGNOSIS — R509 Fever, unspecified: Secondary | ICD-10-CM | POA: Insufficient documentation

## 2021-01-16 LAB — SARS CORONAVIRUS 2 (TAT 6-24 HRS): SARS Coronavirus 2: NEGATIVE

## 2021-01-16 LAB — POCT RAPID STREP A, ED / UC: Streptococcus, Group A Screen (Direct): NEGATIVE

## 2021-01-16 MED ORDER — PSEUDOEPHEDRINE HCL 60 MG PO TABS: 60.0000 mg | ORAL_TABLET | Freq: Three times a day (TID) | ORAL | 0 refills | Status: DC | PRN

## 2021-01-16 MED ORDER — NAPROXEN 375 MG PO TABS: 375.0000 mg | ORAL_TABLET | Freq: Two times a day (BID) | ORAL | 0 refills | Status: DC

## 2021-01-16 MED ORDER — CETIRIZINE HCL 10 MG PO TABS: 10.0000 mg | ORAL_TABLET | Freq: Every day | ORAL | 0 refills | Status: AC

## 2021-01-16 NOTE — ED Provider Notes (Signed)
[facility]   MRN: [id number] DOB: 1996-09-28  Subjective:   Laurie Richards is a 25 y.o. female presenting for 3 day history of persistent throat pain, bilateral temporal headache, subjective fever. Has used Claritin. Has had COVID vaccination but not the booster. Denies cough, chest pain, shob. She is not a smoker.   No current facility-administered medications for this encounter.  Current Outpatient Medications:  .  acetaminophen (TYLENOL) 325 MG tablet, Take 650 mg by mouth every 6 (six) hours as needed., Disp: , Rfl:  .  ibuprofen (ADVIL) 600 MG tablet, Take 600 mg by mouth every 6 (six) hours as needed., Disp: , Rfl:  .  amoxicillin-clavulanate (AUGMENTIN) 875-125 MG tablet, Take 1 tablet by mouth every 12 (twelve) hours., Disp: 20 tablet, Rfl: 0 .  meloxicam (MOBIC) 15 MG tablet, Take 1 tablet (15 mg total) by mouth daily., Disp: 30 tablet, Rfl: 1   Allergies  Allergen Reactions  . Acetaminophen-Codeine Itching and Swelling    Rash, vomiting if takes on empty stomach  . Codeine     nausea    Past Medical History:  Diagnosis Date  . Asthma   . Irregular heartbeat    a. occasional skipped beats since age 58.  . Marijuana abuse    a. smokes 1-2 x/wk.  Marland Kitchen PAF (paroxysmal atrial fibrillation) (HCC)    a. Dx 09/2017 in setting of mild hyperthyroidism.  . Tobacco abuse    a. smoking cigarettes since age 46.  . Tooth abscess 09/2017     Past Surgical History:  Procedure Laterality Date  . APPENDECTOMY    . MOUTH SURGERY      Family History  Problem Relation Age of Onset  . Heart failure Mother   . Coronary artery disease Mother        Status post MI and stenting in her 30s  . Diabetes Other   . Coronary artery disease Maternal Uncle        Status post stenting at a young age  . Coronary artery disease Maternal Uncle        Status post stenting at a young age    Social History   Tobacco Use  . Smoking status: Former Smoker    Packs/day:  0.50    Types: Cigarettes  . Smokeless tobacco: Never Used  Vaping Use  . Vaping Use: Never used  Substance Use Topics  . Alcohol use: Not Currently  . Drug use: Not Currently    ROS   Objective:   Vitals: BP 109/69 (BP Location: Right Arm)   Pulse (!) 107   Temp 98.4 F (36.9 C) (Skin)   Resp (!) 22   LMP 12/31/2020   SpO2 98%   Physical Exam Constitutional:      General: She is not in acute distress.    Appearance: She is well-developed. She is not ill-appearing, toxic-appearing or diaphoretic.  HENT:     Head: Normocephalic and atraumatic.     Right Ear: Tympanic membrane and ear canal normal. No drainage or tenderness. No middle ear effusion. Tympanic membrane is not erythematous.     Left Ear: Tympanic membrane and ear canal normal. No drainage or tenderness.  No middle ear effusion. Tympanic membrane is not erythematous.     Nose: No congestion or rhinorrhea.     Mouth/Throat:     Mouth: Mucous membranes are moist. No oral lesions.     Pharynx: Oropharynx is clear. No pharyngeal swelling, oropharyngeal exudate,  posterior oropharyngeal erythema or uvula swelling.     Tonsils: No tonsillar exudate or tonsillar abscesses.  Eyes:     General: No scleral icterus.       Right eye: No discharge.        Left eye: No discharge.     Extraocular Movements: Extraocular movements intact.     Right eye: Normal extraocular motion.     Left eye: Normal extraocular motion.     Conjunctiva/sclera: Conjunctivae normal.     Pupils: Pupils are equal, round, and reactive to light.  Cardiovascular:     Rate and Rhythm: Normal rate.  Pulmonary:     Effort: Pulmonary effort is normal.  Musculoskeletal:     Cervical back: Normal range of motion and neck supple.  Lymphadenopathy:     Cervical: No cervical adenopathy.  Skin:    General: Skin is warm and dry.  Neurological:     General: No focal deficit present.     Mental Status: She is alert and oriented to person, place, and  time.     Cranial Nerves: No cranial nerve deficit.     Motor: No weakness.     Coordination: Coordination normal.     Gait: Gait normal.     Deep Tendon Reflexes: Reflexes normal.  Psychiatric:        Mood and Affect: Mood normal.        Behavior: Behavior normal.        Thought Content: Thought content normal.        Judgment: Judgment normal.     Results for orders placed or performed during the hospital encounter of 01/16/21 (from the past 24 hour(s))  POCT Rapid Strep A     Status: None   Collection Time: 01/16/21 10:04 AM  Result Value Ref Range   Streptococcus, Group A Screen (Direct) NEGATIVE NEGATIVE    Assessment and Plan :   PDMP not reviewed this encounter.  1. Viral URI   2. Sore throat   3. Temporal headache     Will manage for viral illness such as viral URI, viral syndrome, viral rhinitis, COVID-19. Counseled patient on nature of COVID-19 including modes of transmission, diagnostic testing, management and supportive care.  Offered scripts for symptomatic relief. COVID 19 testing and strep culture are pending. Counseled patient on potential for adverse effects with medications prescribed/recommended today, ER and return-to-clinic precautions discussed, patient verbalized understanding.     Wallis Bamberg, PA-C 01/16/21 1322

## 2021-01-16 NOTE — ED Notes (Addendum)
Labeled specimen obtained and placed in lab (strep swab)

## 2021-01-16 NOTE — Discharge Instructions (Addendum)
We will notify you of your COVID-19 and strep culture test results as they arrive and may take between 24 to 48 hours.  I encourage you to sign up for MyChart if you have not already done so as this can be the easiest way for us to communicate results to you online or through a phone app.  In the meantime, if you develop worsening symptoms including fever, chest pain, shortness of breath despite our current treatment plan then please report to the emergency room as this may be a sign of worsening status from possible COVID-19 infection.  Otherwise, we will manage this as a viral syndrome. For sore throat or cough try using a honey-based tea. Use 3 teaspoons of honey with juice squeezed from half lemon. Place shaved pieces of ginger into 1/2-1 cup of water and warm over stove top. Then mix the ingredients and repeat every 4 hours as needed. Please take Tylenol 500mg-650mg every 6 hours for aches and pains, fevers. Hydrate very well with at least 2 liters of water. Eat light meals such as soups to replenish electrolytes and soft fruits, veggies. Start an antihistamine like Zyrtec, Allegra or Claritin for postnasal drainage, sinus congestion.  You can take this together with pseudoephedrine (Sudafed) at a dose of 60 mg 2-3 times a day as needed for the same kind of congestion.    

## 2021-01-16 NOTE — ED Notes (Signed)
Verified specimen labeled, liquid in tube, lid tightly in place.  Placed specimen in lab.  Specimen obtained by mani, pa

## 2021-01-16 NOTE — ED Triage Notes (Signed)
Complains of sore throat starting Tuesday night, 01/14/2021,  Patient reports a fever and headache.  Reports she is not sure how high fever has been

## 2021-01-18 LAB — CULTURE, GROUP A STREP (THRC)

## 2021-03-03 ENCOUNTER — Encounter (HOSPITAL_COMMUNITY): Payer: Self-pay | Admitting: Emergency Medicine

## 2021-03-03 ENCOUNTER — Emergency Department (HOSPITAL_COMMUNITY): Payer: Self-pay

## 2021-03-03 ENCOUNTER — Inpatient Hospital Stay (HOSPITAL_COMMUNITY)
Admission: EM | Admit: 2021-03-03 | Discharge: 2021-03-09 | DRG: 854 | Disposition: A | Discharge status: Home / Self Care | Payer: Self-pay | Attending: Internal Medicine | Admitting: Internal Medicine

## 2021-03-03 DIAGNOSIS — I480 Paroxysmal atrial fibrillation: Secondary | ICD-10-CM | POA: Diagnosis present

## 2021-03-03 DIAGNOSIS — Z1611 Resistance to penicillins: Secondary | ICD-10-CM | POA: Diagnosis present

## 2021-03-03 DIAGNOSIS — D696 Thrombocytopenia, unspecified: Secondary | ICD-10-CM | POA: Diagnosis present

## 2021-03-03 DIAGNOSIS — N132 Hydronephrosis with renal and ureteral calculous obstruction: Secondary | ICD-10-CM | POA: Diagnosis present

## 2021-03-03 DIAGNOSIS — Z79899 Other long term (current) drug therapy: Secondary | ICD-10-CM

## 2021-03-03 DIAGNOSIS — A419 Sepsis, unspecified organism: Secondary | ICD-10-CM

## 2021-03-03 DIAGNOSIS — Z87442 Personal history of urinary calculi: Secondary | ICD-10-CM

## 2021-03-03 DIAGNOSIS — R6520 Severe sepsis without septic shock: Secondary | ICD-10-CM | POA: Diagnosis present

## 2021-03-03 DIAGNOSIS — N201 Calculus of ureter: Secondary | ICD-10-CM

## 2021-03-03 DIAGNOSIS — Z91040 Latex allergy status: Secondary | ICD-10-CM

## 2021-03-03 DIAGNOSIS — E876 Hypokalemia: Secondary | ICD-10-CM

## 2021-03-03 DIAGNOSIS — Z20822 Contact with and (suspected) exposure to covid-19: Secondary | ICD-10-CM | POA: Diagnosis present

## 2021-03-03 DIAGNOSIS — I429 Cardiomyopathy, unspecified: Secondary | ICD-10-CM | POA: Diagnosis present

## 2021-03-03 DIAGNOSIS — D649 Anemia, unspecified: Secondary | ICD-10-CM | POA: Diagnosis present

## 2021-03-03 DIAGNOSIS — Z791 Long term (current) use of non-steroidal anti-inflammatories (NSAID): Secondary | ICD-10-CM

## 2021-03-03 DIAGNOSIS — Z87891 Personal history of nicotine dependence: Secondary | ICD-10-CM

## 2021-03-03 DIAGNOSIS — J45909 Unspecified asthma, uncomplicated: Secondary | ICD-10-CM | POA: Diagnosis present

## 2021-03-03 DIAGNOSIS — E860 Dehydration: Secondary | ICD-10-CM | POA: Diagnosis present

## 2021-03-03 DIAGNOSIS — N136 Pyonephrosis: Secondary | ICD-10-CM | POA: Diagnosis present

## 2021-03-03 DIAGNOSIS — A4151 Sepsis due to Escherichia coli [E. coli]: Principal | ICD-10-CM | POA: Diagnosis present

## 2021-03-03 DIAGNOSIS — F419 Anxiety disorder, unspecified: Secondary | ICD-10-CM | POA: Diagnosis present

## 2021-03-03 DIAGNOSIS — N12 Tubulo-interstitial nephritis, not specified as acute or chronic: Secondary | ICD-10-CM | POA: Diagnosis present

## 2021-03-03 DIAGNOSIS — Z885 Allergy status to narcotic agent status: Secondary | ICD-10-CM

## 2021-03-03 DIAGNOSIS — Z8249 Family history of ischemic heart disease and other diseases of the circulatory system: Secondary | ICD-10-CM

## 2021-03-03 DIAGNOSIS — Z833 Family history of diabetes mellitus: Secondary | ICD-10-CM

## 2021-03-03 DIAGNOSIS — I5022 Chronic systolic (congestive) heart failure: Secondary | ICD-10-CM | POA: Diagnosis present

## 2021-03-03 DIAGNOSIS — R000 Tachycardia, unspecified: Secondary | ICD-10-CM

## 2021-03-03 DIAGNOSIS — Z888 Allergy status to other drugs, medicaments and biological substances status: Secondary | ICD-10-CM

## 2021-03-03 DIAGNOSIS — R7989 Other specified abnormal findings of blood chemistry: Secondary | ICD-10-CM | POA: Diagnosis present

## 2021-03-03 LAB — CBC
HCT: 41 % (ref 36.0–46.0)
Hemoglobin: 13.7 g/dL (ref 12.0–15.0)
MCH: 27.8 pg (ref 26.0–34.0)
MCHC: 33.4 g/dL (ref 30.0–36.0)
MCV: 83.2 fL (ref 80.0–100.0)
Platelets: 185 10*3/uL (ref 150–400)
RBC: 4.93 MIL/uL (ref 3.87–5.11)
RDW: 14.5 % (ref 11.5–15.5)
WBC: 12.8 10*3/uL — ABNORMAL HIGH (ref 4.0–10.5)
nRBC: 0 % (ref 0.0–0.2)

## 2021-03-03 LAB — RESP PANEL BY RT-PCR (FLU A&B, COVID) ARPGX2
Influenza A by PCR: NEGATIVE
Influenza B by PCR: NEGATIVE
SARS Coronavirus 2 by RT PCR: NEGATIVE

## 2021-03-03 LAB — TROPONIN I (HIGH SENSITIVITY): Troponin I (High Sensitivity): 8 ng/L (ref <18)

## 2021-03-03 LAB — COMPREHENSIVE METABOLIC PANEL
ALT: 45 U/L — ABNORMAL HIGH (ref 0–44)
AST: 42 U/L — ABNORMAL HIGH (ref 15–41)
Albumin: 3.2 g/dL — ABNORMAL LOW (ref 3.5–5.0)
Alkaline Phosphatase: 66 U/L (ref 38–126)
Anion gap: 12 (ref 5–15)
BUN: 16 mg/dL (ref 6–20)
CO2: 21 mmol/L — ABNORMAL LOW (ref 22–32)
Calcium: 8.5 mg/dL — ABNORMAL LOW (ref 8.9–10.3)
Chloride: 100 mmol/L (ref 98–111)
Creatinine, Ser: 1.05 mg/dL — ABNORMAL HIGH (ref 0.44–1.00)
GFR, Estimated: >60 mL/min (ref >60)
Glucose, Bld: 130 mg/dL — ABNORMAL HIGH (ref 70–99)
Potassium: 3.2 mmol/L — ABNORMAL LOW (ref 3.5–5.1)
Sodium: 133 mmol/L — ABNORMAL LOW (ref 135–145)
Total Bilirubin: 1.2 mg/dL (ref 0.3–1.2)
Total Protein: 6.7 g/dL (ref 6.5–8.1)

## 2021-03-03 LAB — LIPASE, BLOOD: Lipase: 27 U/L (ref 11–51)

## 2021-03-03 MED ORDER — SODIUM CHLORIDE 0.9 % IV BOLUS
1000.0000 mL | Freq: Once | INTRAVENOUS | Status: AC
  Administered 2021-03-04: 1000 mL via INTRAVENOUS

## 2021-03-03 NOTE — ED Triage Notes (Signed)
Emergency Medicine Provider Triage Evaluation Note  Laurie Richards , a 25 y.o. female  was evaluated in triage.  Pt complains of palpitations, myalgias, lightheadedness, and right-sided chest pain x2 days. She admits to intermittent fever. Denies sick contacts and known COVID exposures. Patient also endorses nausea and vomiting.  Review of Systems  Positive: CP, dizziness, fever, N, vomiting Negative: diarrhea  Physical Exam  BP 105/69 (BP Location: Right Arm)   Pulse (!) 151   Temp 98.1 F (36.7 C) (Oral)   Resp 16   SpO2 99%  Gen:   Awake, no distress   HEENT:  Atraumatic  Resp:  Normal effort  Cardiac:  Tachycardic Abd:   Nondistended, nontender  MSK:   Moves extremities without difficulty  Neuro:  Speech clear   Medical Decision Making  Medically screening exam initiated at 9:49 PM.  Appropriate orders placed.  Laurie Richards was informed that the remainder of the evaluation will be completed by another provider, this initial triage assessment does not replace that evaluation, and the importance of remaining in the ED until their evaluation is complete.  Clinical Impression  Labs ordered.    Laurie Richards, [place] 03/03/21 2151

## 2021-03-03 NOTE — ED Triage Notes (Signed)
Pt reports tachycardia for the last 2 days. Reports noting hr upto 169 on apple watch. Pt reports she is lightheaded, dizzy. With pain in her right ribs radiating to her back. Pt reports she has been nauseous and vomiting as well. Pt awake, alert, appropriate. Does not appear in distress at this moment.

## 2021-03-04 ENCOUNTER — Encounter (HOSPITAL_COMMUNITY): Payer: Self-pay | Admitting: Family Medicine

## 2021-03-04 ENCOUNTER — Inpatient Hospital Stay (HOSPITAL_COMMUNITY): Payer: Self-pay | Admitting: Certified Registered"

## 2021-03-04 ENCOUNTER — Inpatient Hospital Stay (HOSPITAL_COMMUNITY): Payer: Self-pay

## 2021-03-04 ENCOUNTER — Emergency Department (HOSPITAL_COMMUNITY): Payer: Self-pay

## 2021-03-04 ENCOUNTER — Other Ambulatory Visit: Payer: Self-pay

## 2021-03-04 ENCOUNTER — Encounter (HOSPITAL_COMMUNITY)
Admission: EM | Disposition: A | Discharge status: Home / Self Care | Payer: Self-pay | Source: Home / Self Care | Attending: Internal Medicine

## 2021-03-04 DIAGNOSIS — N12 Tubulo-interstitial nephritis, not specified as acute or chronic: Secondary | ICD-10-CM | POA: Diagnosis present

## 2021-03-04 DIAGNOSIS — A419 Sepsis, unspecified organism: Secondary | ICD-10-CM

## 2021-03-04 DIAGNOSIS — R000 Tachycardia, unspecified: Secondary | ICD-10-CM

## 2021-03-04 DIAGNOSIS — E876 Hypokalemia: Secondary | ICD-10-CM

## 2021-03-04 DIAGNOSIS — N132 Hydronephrosis with renal and ureteral calculous obstruction: Secondary | ICD-10-CM | POA: Diagnosis present

## 2021-03-04 HISTORY — PX: CYSTOSCOPY WITH URETEROSCOPY AND STENT PLACEMENT: SHX6377

## 2021-03-04 LAB — MAGNESIUM
Magnesium: 1.2 mg/dL — ABNORMAL LOW (ref 1.7–2.4)
Magnesium: 2.4 mg/dL (ref 1.7–2.4)

## 2021-03-04 LAB — HCG, SERUM, QUALITATIVE: Preg, Serum: NEGATIVE

## 2021-03-04 LAB — URINALYSIS, ROUTINE W REFLEX MICROSCOPIC
Bilirubin Urine: NEGATIVE
Glucose, UA: NEGATIVE mg/dL
Ketones, ur: 20 mg/dL — AB
Nitrite: POSITIVE — AB
Protein, ur: 100 mg/dL — AB
RBC / HPF: >50 RBC/hpf — ABNORMAL HIGH (ref 0–5)
Specific Gravity, Urine: 1.015 (ref 1.005–1.030)
WBC, UA: >50 WBC/hpf — ABNORMAL HIGH (ref 0–5)
pH: 5 (ref 5.0–8.0)

## 2021-03-04 LAB — I-STAT BETA HCG BLOOD, ED (MC, WL, AP ONLY): I-stat hCG, quantitative: 19.9 m[IU]/mL — ABNORMAL HIGH (ref <5)

## 2021-03-04 LAB — CBG MONITORING, ED: Glucose-Capillary: 84 mg/dL (ref 70–99)

## 2021-03-04 LAB — TSH: TSH: 0.736 u[IU]/mL (ref 0.350–4.500)

## 2021-03-04 LAB — BASIC METABOLIC PANEL
Anion gap: 9 (ref 5–15)
BUN: 11 mg/dL (ref 6–20)
CO2: 19 mmol/L — ABNORMAL LOW (ref 22–32)
Calcium: 7.9 mg/dL — ABNORMAL LOW (ref 8.9–10.3)
Chloride: 107 mmol/L (ref 98–111)
Creatinine, Ser: 0.85 mg/dL (ref 0.44–1.00)
GFR, Estimated: >60 mL/min (ref >60)
Glucose, Bld: 170 mg/dL — ABNORMAL HIGH (ref 70–99)
Potassium: 3.7 mmol/L (ref 3.5–5.1)
Sodium: 135 mmol/L (ref 135–145)

## 2021-03-04 LAB — HCG, QUANTITATIVE, PREGNANCY: hCG, Beta Chain, Quant, S: 1 m[IU]/mL (ref <5)

## 2021-03-04 LAB — TROPONIN I (HIGH SENSITIVITY): Troponin I (High Sensitivity): 7 ng/L (ref <18)

## 2021-03-04 LAB — HIV ANTIBODY (ROUTINE TESTING W REFLEX): HIV Screen 4th Generation wRfx: NONREACTIVE

## 2021-03-04 LAB — LACTIC ACID, PLASMA
Lactic Acid, Venous: 1.2 mmol/L (ref 0.5–1.9)
Lactic Acid, Venous: 1.6 mmol/L (ref 0.5–1.9)

## 2021-03-04 LAB — RAPID URINE DRUG SCREEN, HOSP PERFORMED
Amphetamines: NOT DETECTED
Barbiturates: NOT DETECTED
Benzodiazepines: NOT DETECTED
Cocaine: NOT DETECTED
Opiates: NOT DETECTED
Tetrahydrocannabinol: NOT DETECTED

## 2021-03-04 LAB — GLUCOSE, CAPILLARY: Glucose-Capillary: 173 mg/dL — ABNORMAL HIGH (ref 70–99)

## 2021-03-04 SURGERY — CYSTOURETEROSCOPY, WITH STENT INSERTION
Anesthesia: General | Site: Ureter | Laterality: Right

## 2021-03-04 MED ORDER — DEXAMETHASONE SODIUM PHOSPHATE 10 MG/ML IJ SOLN
INTRAMUSCULAR | Status: DC | PRN
  Administered 2021-03-04: 5 mg via INTRAVENOUS

## 2021-03-04 MED ORDER — FENTANYL CITRATE (PF) 250 MCG/5ML IJ SOLN
INTRAMUSCULAR | Status: AC
  Filled 2021-03-04: qty 5

## 2021-03-04 MED ORDER — ONDANSETRON HCL 4 MG PO TABS: 4.0000 mg | ORAL_TABLET | Freq: Four times a day (QID) | ORAL | Status: DC | PRN

## 2021-03-04 MED ORDER — MIDAZOLAM HCL 2 MG/2ML IJ SOLN
INTRAMUSCULAR | Status: DC | PRN
  Administered 2021-03-04: 2 mg via INTRAVENOUS

## 2021-03-04 MED ORDER — SODIUM CHLORIDE 0.9 % IV SOLN
2.0000 g | INTRAVENOUS | Status: DC
  Administered 2021-03-04 – 2021-03-06 (×3): 2 g via INTRAVENOUS
  Filled 2021-03-04 (×4): qty 20
  Filled 2021-03-04: qty 2

## 2021-03-04 MED ORDER — PROPOFOL 10 MG/ML IV BOLUS
INTRAVENOUS | Status: AC
  Filled 2021-03-04: qty 20

## 2021-03-04 MED ORDER — WATER FOR IRRIGATION, STERILE IR SOLN
Status: DC | PRN
  Administered 2021-03-04: 1000 mL

## 2021-03-04 MED ORDER — PROMETHAZINE HCL 25 MG/ML IJ SOLN: 6.2500 mg | INTRAMUSCULAR | Status: DC | PRN

## 2021-03-04 MED ORDER — 0.9 % SODIUM CHLORIDE (POUR BTL) OPTIME
TOPICAL | Status: DC | PRN
  Administered 2021-03-04: 1000 mL

## 2021-03-04 MED ORDER — DEXAMETHASONE SODIUM PHOSPHATE 10 MG/ML IJ SOLN
INTRAMUSCULAR | Status: AC
  Filled 2021-03-04: qty 1

## 2021-03-04 MED ORDER — MIDAZOLAM HCL 2 MG/2ML IJ SOLN
INTRAMUSCULAR | Status: AC
  Filled 2021-03-04: qty 2

## 2021-03-04 MED ORDER — PHENYLEPHRINE 40 MCG/ML (10ML) SYRINGE FOR IV PUSH (FOR BLOOD PRESSURE SUPPORT)
PREFILLED_SYRINGE | INTRAVENOUS | Status: AC
  Filled 2021-03-04: qty 10

## 2021-03-04 MED ORDER — POTASSIUM CHLORIDE 10 MEQ/100ML IV SOLN
10.0000 meq | INTRAVENOUS | Status: AC
  Administered 2021-03-04 (×2): 10 meq via INTRAVENOUS
  Filled 2021-03-04 (×2): qty 100

## 2021-03-04 MED ORDER — SODIUM CHLORIDE 0.9 % IV SOLN
1.0000 g | Freq: Once | INTRAVENOUS | Status: AC
  Administered 2021-03-04: 1 g via INTRAVENOUS
  Filled 2021-03-04: qty 10

## 2021-03-04 MED ORDER — ONDANSETRON HCL 4 MG/2ML IJ SOLN
4.0000 mg | Freq: Four times a day (QID) | INTRAMUSCULAR | Status: DC | PRN
  Administered 2021-03-07: 4 mg via INTRAVENOUS

## 2021-03-04 MED ORDER — ONDANSETRON HCL 4 MG/2ML IJ SOLN
INTRAMUSCULAR | Status: AC
  Filled 2021-03-04: qty 2

## 2021-03-04 MED ORDER — ONDANSETRON HCL 4 MG/2ML IJ SOLN
INTRAMUSCULAR | Status: DC | PRN
  Administered 2021-03-04: 4 mg via INTRAVENOUS

## 2021-03-04 MED ORDER — CHLORHEXIDINE GLUCONATE 0.12 % MT SOLN
15.0000 mL | Freq: Once | OROMUCOSAL | Status: AC
  Administered 2021-03-04: 15 mL via OROMUCOSAL

## 2021-03-04 MED ORDER — SODIUM CHLORIDE 0.9 % IV BOLUS
1000.0000 mL | Freq: Once | INTRAVENOUS | Status: AC
  Administered 2021-03-04: 1000 mL via INTRAVENOUS

## 2021-03-04 MED ORDER — LACTATED RINGERS IV BOLUS
500.0000 mL | Freq: Once | INTRAVENOUS | Status: AC
  Administered 2021-03-04: 500 mL via INTRAVENOUS

## 2021-03-04 MED ORDER — ONDANSETRON HCL 4 MG/2ML IJ SOLN
4.0000 mg | Freq: Once | INTRAMUSCULAR | Status: AC
  Administered 2021-03-04: 4 mg via INTRAVENOUS
  Filled 2021-03-04: qty 2

## 2021-03-04 MED ORDER — FENTANYL CITRATE (PF) 250 MCG/5ML IJ SOLN
INTRAMUSCULAR | Status: DC | PRN
  Administered 2021-03-04: 50 ug via INTRAVENOUS

## 2021-03-04 MED ORDER — FLUTICASONE PROPIONATE 50 MCG/ACT NA SUSP
2.0000 | Freq: Every day | NASAL | Status: DC
  Administered 2021-03-05 – 2021-03-09 (×4): 2 via NASAL
  Filled 2021-03-04 (×3): qty 16

## 2021-03-04 MED ORDER — ONDANSETRON HCL 4 MG/2ML IJ SOLN
4.0000 mg | Freq: Four times a day (QID) | INTRAMUSCULAR | Status: DC | PRN
Start: 2021-03-04 — End: 2021-03-09
  Filled 2021-03-04: qty 2

## 2021-03-04 MED ORDER — LACTATED RINGERS IV SOLN: INTRAVENOUS | Status: DC

## 2021-03-04 MED ORDER — PHENYLEPHRINE 40 MCG/ML (10ML) SYRINGE FOR IV PUSH (FOR BLOOD PRESSURE SUPPORT)
PREFILLED_SYRINGE | INTRAVENOUS | Status: DC | PRN
  Administered 2021-03-04: 120 ug via INTRAVENOUS
  Administered 2021-03-04: 160 ug via INTRAVENOUS

## 2021-03-04 MED ORDER — FENTANYL CITRATE (PF) 100 MCG/2ML IJ SOLN: 25.0000 ug | INTRAMUSCULAR | Status: DC | PRN

## 2021-03-04 MED ORDER — ACETAMINOPHEN 650 MG RE SUPP: 650.0000 mg | Freq: Four times a day (QID) | RECTAL | Status: DC | PRN

## 2021-03-04 MED ORDER — MAGNESIUM SULFATE 4 GM/100ML IV SOLN
4.0000 g | Freq: Once | INTRAVENOUS | Status: AC
  Administered 2021-03-04: 4 g via INTRAVENOUS
  Filled 2021-03-04: qty 100

## 2021-03-04 MED ORDER — LIDOCAINE 2% (20 MG/ML) 5 ML SYRINGE
INTRAMUSCULAR | Status: DC | PRN
  Administered 2021-03-04: 60 mg via INTRAVENOUS

## 2021-03-04 MED ORDER — ACETAMINOPHEN 500 MG PO TABS
1000.0000 mg | ORAL_TABLET | Freq: Once | ORAL | Status: AC
  Administered 2021-03-04: 1000 mg via ORAL
  Filled 2021-03-04: qty 2

## 2021-03-04 MED ORDER — ACETAMINOPHEN 325 MG PO TABS
650.0000 mg | ORAL_TABLET | Freq: Four times a day (QID) | ORAL | Status: DC | PRN
  Administered 2021-03-04 – 2021-03-06 (×3): 650 mg via ORAL
  Filled 2021-03-04 (×3): qty 2

## 2021-03-04 MED ORDER — IOHEXOL 300 MG/ML  SOLN
INTRAMUSCULAR | Status: DC | PRN
  Administered 2021-03-04: 5 mL

## 2021-03-04 MED ORDER — TRAMADOL HCL 50 MG PO TABS
50.0000 mg | ORAL_TABLET | Freq: Four times a day (QID) | ORAL | Status: DC | PRN
  Administered 2021-03-07: 100 mg via ORAL
  Filled 2021-03-04: qty 2

## 2021-03-04 MED ORDER — TRAMADOL HCL 50 MG PO TABS
50.0000 mg | ORAL_TABLET | Freq: Four times a day (QID) | ORAL | Status: DC | PRN
  Administered 2021-03-06 – 2021-03-09 (×6): 50 mg via ORAL
  Filled 2021-03-04 (×8): qty 1

## 2021-03-04 MED ORDER — MORPHINE SULFATE (PF) 4 MG/ML IV SOLN
4.0000 mg | Freq: Once | INTRAVENOUS | Status: AC
  Administered 2021-03-04: 4 mg via INTRAVENOUS
  Filled 2021-03-04: qty 1

## 2021-03-04 MED ORDER — POTASSIUM CHLORIDE 10 MEQ/100ML IV SOLN
10.0000 meq | INTRAVENOUS | Status: AC
Start: 2021-03-04 — End: 2021-03-04
  Administered 2021-03-04 (×2): 10 meq via INTRAVENOUS
  Filled 2021-03-04 (×2): qty 100

## 2021-03-04 MED ORDER — IOHEXOL 300 MG/ML  SOLN
80.0000 mL | Freq: Once | INTRAMUSCULAR | Status: AC | PRN
  Administered 2021-03-04: 80 mL via INTRAVENOUS

## 2021-03-04 MED ORDER — SODIUM CHLORIDE 0.9 % IV SOLN: 1.0000 g | INTRAVENOUS | Status: DC

## 2021-03-04 MED ORDER — METOPROLOL TARTRATE 5 MG/5ML IV SOLN
2.5000 mg | Freq: Once | INTRAVENOUS | Status: AC
  Administered 2021-03-04: 2.5 mg via INTRAVENOUS
  Filled 2021-03-04: qty 5

## 2021-03-04 MED ORDER — ORAL CARE MOUTH RINSE: 15.0000 mL | Freq: Once | OROMUCOSAL | Status: AC

## 2021-03-04 MED ORDER — KETOROLAC TROMETHAMINE 30 MG/ML IJ SOLN
30.0000 mg | Freq: Three times a day (TID) | INTRAMUSCULAR | Status: AC | PRN
  Administered 2021-03-04 (×2): 30 mg via INTRAVENOUS
  Filled 2021-03-04 (×2): qty 1

## 2021-03-04 MED ORDER — LIDOCAINE 2% (20 MG/ML) 5 ML SYRINGE
INTRAMUSCULAR | Status: AC
  Filled 2021-03-04: qty 5

## 2021-03-04 MED ORDER — PROPOFOL 10 MG/ML IV BOLUS
INTRAVENOUS | Status: DC | PRN
  Administered 2021-03-04: 100 mg via INTRAVENOUS
  Administered 2021-03-04: 60 mg via INTRAVENOUS

## 2021-03-04 MED ORDER — LORATADINE 10 MG PO TABS
10.0000 mg | ORAL_TABLET | Freq: Every day | ORAL | Status: DC
  Administered 2021-03-05 – 2021-03-09 (×5): 10 mg via ORAL
  Filled 2021-03-04 (×5): qty 1

## 2021-03-04 MED ORDER — LACTATED RINGERS IV SOLN: INTRAVENOUS | Status: AC

## 2021-03-04 MED ORDER — HYDROMORPHONE HCL 1 MG/ML IJ SOLN
0.5000 mg | INTRAMUSCULAR | Status: DC | PRN
  Administered 2021-03-05 – 2021-03-08 (×12): 0.5 mg via INTRAVENOUS
  Filled 2021-03-04 (×12): qty 1

## 2021-03-04 SURGICAL SUPPLY — 23 items
BAG DRN RND TRDRP ANRFLXCHMBR (UROLOGICAL SUPPLIES) ×1
BAG URINE DRAIN 2000ML AR STRL (UROLOGICAL SUPPLIES) ×2 IMPLANT
BAG URO CATCHER STRL LF (MISCELLANEOUS) ×2 IMPLANT
CATH FOLEY 2WAY SLVR  5CC 16FR (CATHETERS)
CATH FOLEY 2WAY SLVR 5CC 16FR (CATHETERS) IMPLANT
CATH INTERMIT  6FR 70CM (CATHETERS) ×2 IMPLANT
GLOVE BIOGEL M 8.0 STRL (GLOVE) ×2 IMPLANT
GOWN STRL REUS W/ TWL LRG LVL3 (GOWN DISPOSABLE) ×1 IMPLANT
GOWN STRL REUS W/ TWL XL LVL3 (GOWN DISPOSABLE) ×1 IMPLANT
GOWN STRL REUS W/TWL LRG LVL3 (GOWN DISPOSABLE) ×2
GOWN STRL REUS W/TWL XL LVL3 (GOWN DISPOSABLE) ×2
GUIDEWIRE ANG ZIPWIRE 038X150 (WIRE) IMPLANT
GUIDEWIRE STR DUAL SENSOR (WIRE) ×2 IMPLANT
KIT TURNOVER KIT B (KITS) ×2 IMPLANT
MANIFOLD NEPTUNE II (INSTRUMENTS) IMPLANT
NS IRRIG 1000ML POUR BTL (IV SOLUTION) IMPLANT
PACK CYSTO (CUSTOM PROCEDURE TRAY) ×2 IMPLANT
STENT URET 6FRX24 CONTOUR (STENTS) IMPLANT
STENT URET 6FRX26 CONTOUR (STENTS) IMPLANT
SYPHON OMNI JUG (MISCELLANEOUS) ×2 IMPLANT
TOWEL GREEN STERILE FF (TOWEL DISPOSABLE) ×2 IMPLANT
TUBE CONNECTING 12X1/4 (SUCTIONS) IMPLANT
WATER STERILE IRR 3000ML UROMA (IV SOLUTION) ×2 IMPLANT

## 2021-03-04 NOTE — Anesthesia Procedure Notes (Signed)
Procedure Name: LMA Insertion Date/Time: 03/04/2021 4:20 PM Performed by: De Nurse, CRNA Pre-anesthesia Checklist: Patient identified, Emergency Drugs available, Suction available and Patient being monitored Patient Re-evaluated:Patient Re-evaluated prior to induction Oxygen Delivery Method: Circle System Utilized Preoxygenation: Pre-oxygenation with 100% oxygen Induction Type: IV induction Ventilation: Mask ventilation without difficulty LMA: LMA inserted LMA Size: 4.0 Number of attempts: 1 Placement Confirmation: positive ETCO2 Tube secured with: Tape Dental Injury: Teeth and Oropharynx as per pre-operative assessment

## 2021-03-04 NOTE — Consult Note (Signed)
Subjective: 1. Pyelonephritis   2. Severe sepsis (HCC)   3. Hydronephrosis with infection    4.      Probable right distal stone.   Consult requested by Dr. Ross Marcus  Laurie Richards is a 25 yo female with no prior urologic history who presented to the ER with about a 3 day history of right flank pain, nausea with vomiting and fever.   She had no hematuria.  She has had some irritative voiding symptoms.   Her UA looks infected and she has signs and symptoms of sepsis.   A CT was obtained which demonstrated findings consistent with pyelonephritis on the right with at most minimal dilation of the renal pelvis and proximal ureter.  There was a 4mm calcification in the right pelvis that could be a stone or phlebolith.  It was not present on CT in 2018 but there is a small calcification that is a phlebolith that was also not present on the prior CT.   A subsequent KUB demonstrated no hydro but the calcification is in line with the distal ureter on the right and is suggestive of a non-obstructing stone.     ROS:  Review of Systems  Constitutional: Positive for chills and fever.  Cardiovascular: Positive for chest pain.  Gastrointestinal: Positive for nausea and vomiting.  Genitourinary: Positive for flank pain and urgency.  All other systems reviewed and are negative.   Allergies  Allergen Reactions  . Acetaminophen-Codeine Itching and Swelling    Rash, vomiting if takes on empty stomach  . Codeine     nausea    Past Medical History:  Diagnosis Date  . Asthma   . Irregular heartbeat    a. occasional skipped beats since age 34.  . Marijuana abuse    a. smokes 1-2 x/wk.  Marland Kitchen PAF (paroxysmal atrial fibrillation) (HCC)    a. Dx 09/2017 in setting of mild hyperthyroidism.  . Tobacco abuse    a. smoking cigarettes since age 48.  . Tooth abscess 09/2017    Past Surgical History:  Procedure Laterality Date  . APPENDECTOMY    . MOUTH SURGERY      Social History   Socioeconomic History   . Marital status: Single    Spouse name: Not on file  . Number of children: Not on file  . Years of education: Not on file  . Highest education level: Not on file  Occupational History  . Not on file  Tobacco Use  . Smoking status: Former Smoker    Packs/day: 0.50    Types: Cigarettes  . Smokeless tobacco: Never Used  Vaping Use  . Vaping Use: Never used  Substance and Sexual Activity  . Alcohol use: Not Currently  . Drug use: Not Currently  . Sexual activity: Yes    Birth control/protection: None  Other Topics Concern  . Not on file  Social History Narrative   Lives locally with fiance.  Does not routinely exercise.   Social Determinants of Health   Financial Resource Strain: Not on file  Food Insecurity: Not on file  Transportation Needs: Not on file  Physical Activity: Not on file  Stress: Not on file  Social Connections: Not on file  Intimate Partner Violence: Not on file    Family History  Problem Relation Age of Onset  . Heart failure Mother   . Coronary artery disease Mother        Status post MI and stenting in her 30s  . Diabetes Other   .  Coronary artery disease Maternal Uncle        Status post stenting at a young age  . Coronary artery disease Maternal Uncle        Status post stenting at a young age    Anti-infectives: Anti-infectives (From admission, onward)   Start     Dose/Rate Route Frequency Ordered Stop   03/04/21 1000  cefTRIAXone (ROCEPHIN) 2 g in sodium chloride 0.9 % 100 mL IVPB        2 g 200 mL/hr over 30 Minutes Intravenous Every 24 hours 03/04/21 0709     03/04/21 0730  cefTRIAXone (ROCEPHIN) 1 g in sodium chloride 0.9 % 100 mL IVPB  Status:  Discontinued        1 g 200 mL/hr over 30 Minutes Intravenous Every 24 hours 03/04/21 0720 03/04/21 0725   03/04/21 0215  cefTRIAXone (ROCEPHIN) 1 g in sodium chloride 0.9 % 100 mL IVPB        1 g 200 mL/hr over 30 Minutes Intravenous  Once 03/04/21 0206 03/04/21 0301      Current  Facility-Administered Medications  Medication Dose Route Frequency Provider Last Rate Last Admin  . acetaminophen (TYLENOL) tablet 650 mg  650 mg Oral Q6H PRN Chotiner, Claudean Severance, MD       Or  . acetaminophen (TYLENOL) suppository 650 mg  650 mg Rectal Q6H PRN Chotiner, Claudean Severance, MD      . cefTRIAXone (ROCEPHIN) 2 g in sodium chloride 0.9 % 100 mL IVPB  2 g Intravenous Q24H Hall, Carole N, DO      . fluticasone (FLONASE) 50 MCG/ACT nasal spray 2 spray  2 spray Each Nare Daily Chotiner, Claudean Severance, MD      . ketorolac (TORADOL) 30 MG/ML injection 30 mg  30 mg Intravenous Q8H PRN Chotiner, Claudean Severance, MD      . lactated ringers infusion   Intravenous Continuous Wilkie Aye Mayer Masker, MD 150 mL/hr at 03/04/21 0421 New Bag at 03/04/21 0421  . lactated ringers infusion   Intravenous Continuous Chotiner, Claudean Severance, MD      . loratadine (CLARITIN) tablet 10 mg  10 mg Oral Daily Chotiner, Claudean Severance, MD      . magnesium sulfate IVPB 4 g 100 mL  4 g Intravenous Once Hall, Carole N, DO      . ondansetron (ZOFRAN) tablet 4 mg  4 mg Oral Q6H PRN Chotiner, Claudean Severance, MD       Or  . ondansetron (ZOFRAN) injection 4 mg  4 mg Intravenous Q6H PRN Chotiner, Claudean Severance, MD      . ondansetron [facility]) injection 4 mg  4 mg Intravenous Q6H PRN Dow Adolph N, DO      . potassium chloride 10 mEq in 100 mL IVPB  10 mEq Intravenous Q1 Hr x 3 Chotiner, Claudean Severance, MD      . potassium chloride 10 mEq in 100 mL IVPB  10 mEq Intravenous Q1 Hr x 3 Hall, Carole N, DO      . traMADol (ULTRAM) tablet 50 mg  50 mg Oral Q6H PRN Chotiner, Claudean Severance, MD       Current Outpatient Medications  Medication Sig Dispense Refill  . acetaminophen (TYLENOL) 325 MG tablet Take 650 mg by mouth every 6 (six) hours as needed.    Marland Kitchen amoxicillin-clavulanate (AUGMENTIN) 875-125 MG tablet Take 1 tablet by mouth every 12 (twelve) hours. 20 tablet 0  . cetirizine (ZYRTEC ALLERGY) 10 MG tablet Take 1 tablet (10  mg total) by mouth daily. 30 tablet 0  .  ibuprofen (ADVIL) 600 MG tablet Take 600 mg by mouth every 6 (six) hours as needed.    . meloxicam (MOBIC) 15 MG tablet Take 1 tablet (15 mg total) by mouth daily. 30 tablet 1  . naproxen (NAPROSYN) 375 MG tablet Take 1 tablet (375 mg total) by mouth 2 (two) times daily with a meal. 30 tablet 0  . pseudoephedrine (SUDAFED) 60 MG tablet Take 1 tablet (60 mg total) by mouth every 8 (eight) hours as needed for congestion. 30 tablet 0     Objective: Vital signs in last 24 hours: BP (!) 94/35   Pulse (!) 144   Temp 99.9 F (37.7 C) (Oral)   Resp (!) 24   LMP 02/24/2021   SpO2 99%   Intake/Output from previous day: 04/25 0701 - 04/26 0700 In: 1000 [IV Piggyback:1000] Out: -  Intake/Output this shift: No intake/output data recorded.   Physical Exam Vitals reviewed.  Constitutional:      Appearance: Normal appearance.  HENT:     Head: Normocephalic and atraumatic.  Cardiovascular:     Rate and Rhythm: Regular rhythm. Tachycardia present.     Heart sounds: Normal heart sounds.  Pulmonary:     Effort: Pulmonary effort is normal. No respiratory distress.     Breath sounds: Normal breath sounds.  Abdominal:     General: Abdomen is flat.     Palpations: Abdomen is soft.     Tenderness: There is abdominal tenderness (RUQ). There is right CVA tenderness.  Musculoskeletal:        General: No swelling or tenderness. Normal range of motion.  Skin:    General: Skin is warm and dry.  Neurological:     General: No focal deficit present.     Mental Status: She is alert and oriented to person, place, and time.  Psychiatric:        Mood and Affect: Mood normal.        Behavior: Behavior normal.     Lab Results:  Results for orders placed or performed during the hospital encounter of 03/03/21 (from the past 24 hour(s))  Resp Panel by RT-PCR (Flu A&B, Covid) Nasopharyngeal Swab     Status: None   Collection Time: 03/03/21  9:54 PM   Specimen: Nasopharyngeal Swab; Nasopharyngeal(NP)  swabs in vial transport medium  Result Value Ref Range   SARS Coronavirus 2 by RT PCR NEGATIVE NEGATIVE   Influenza A by PCR NEGATIVE NEGATIVE   Influenza B by PCR NEGATIVE NEGATIVE  CBC     Status: Abnormal   Collection Time: 03/03/21  9:55 PM  Result Value Ref Range   WBC 12.8 (H) 4.0 - 10.5 K/uL   RBC 4.93 3.87 - 5.11 MIL/uL   Hemoglobin 13.7 12.0 - 15.0 g/dL   HCT 16.141.0 09.636.0 - 04.546.0 %   MCV 83.2 80.0 - 100.0 fL   MCH 27.8 26.0 - 34.0 pg   MCHC 33.4 30.0 - 36.0 g/dL   RDW 40.914.5 81.111.5 - 91.415.5 %   Platelets 185 150 - 400 K/uL   nRBC 0.0 0.0 - 0.2 %  Troponin I (High Sensitivity)     Status: None   Collection Time: 03/03/21  9:55 PM  Result Value Ref Range   Troponin I (High Sensitivity) 8 <18 ng/L  Comprehensive metabolic panel     Status: Abnormal   Collection Time: 03/03/21  9:55 PM  Result Value Ref Range   Sodium  133 (L) 135 - 145 mmol/L   Potassium 3.2 (L) 3.5 - 5.1 mmol/L   Chloride 100 98 - 111 mmol/L   CO2 21 (L) 22 - 32 mmol/L   Glucose, Bld 130 (H) 70 - 99 mg/dL   BUN 16 6 - 20 mg/dL   Creatinine, Ser 9.79 (H) 0.44 - 1.00 mg/dL   Calcium 8.5 (L) 8.9 - 10.3 mg/dL   Total Protein 6.7 6.5 - 8.1 g/dL   Albumin 3.2 (L) 3.5 - 5.0 g/dL   AST 42 (H) 15 - 41 U/L   ALT 45 (H) 0 - 44 U/L   Alkaline Phosphatase 66 38 - 126 U/L   Total Bilirubin 1.2 0.3 - 1.2 mg/dL   GFR, Estimated >89 >21 mL/min   Anion gap 12 5 - 15  Lipase, blood     Status: None   Collection Time: 03/03/21  9:55 PM  Result Value Ref Range   Lipase 27 11 - 51 U/L  Troponin I (High Sensitivity)     Status: None   Collection Time: 03/03/21 11:46 PM  Result Value Ref Range   Troponin I (High Sensitivity) 7 <18 ng/L  hCG, quantitative, pregnancy     Status: None   Collection Time: 03/03/21 11:46 PM  Result Value Ref Range   hCG, Beta Chain, Quant, S 1 <5 mIU/mL  Lactic acid, plasma     Status: None   Collection Time: 03/04/21 12:30 AM  Result Value Ref Range   Lactic Acid, Venous 1.2 0.5 - 1.9 mmol/L   TSH     Status: None   Collection Time: 03/04/21 12:38 AM  Result Value Ref Range   TSH 0.736 0.350 - 4.500 uIU/mL  I-Stat beta hCG blood, ED     Status: Abnormal   Collection Time: 03/04/21  1:32 AM  Result Value Ref Range   I-stat hCG, quantitative 19.9 (H) <5 mIU/mL   Comment 3          Urinalysis, Routine w reflex microscopic     Status: Abnormal   Collection Time: 03/04/21  1:39 AM  Result Value Ref Range   Color, Urine YELLOW YELLOW   APPearance CLOUDY (A) CLEAR   Specific Gravity, Urine 1.015 1.005 - 1.030   pH 5.0 5.0 - 8.0   Glucose, UA NEGATIVE NEGATIVE mg/dL   Hgb urine dipstick MODERATE (A) NEGATIVE   Bilirubin Urine NEGATIVE NEGATIVE   Ketones, ur 20 (A) NEGATIVE mg/dL   Protein, ur 194 (A) NEGATIVE mg/dL   Nitrite POSITIVE (A) NEGATIVE   Leukocytes,Ua MODERATE (A) NEGATIVE   RBC / HPF >50 (H) 0 - 5 RBC/hpf   WBC, UA >50 (H) 0 - 5 WBC/hpf   Bacteria, UA MANY (A) NONE SEEN   Squamous Epithelial / LPF 11-20 0 - 5   WBC Clumps PRESENT    Mucus PRESENT    Hyaline Casts, UA PRESENT    Non Squamous Epithelial 0-5 (A) NONE SEEN  Rapid urine drug screen (hospital performed)     Status: None   Collection Time: 03/04/21  1:39 AM  Result Value Ref Range   Opiates NONE DETECTED NONE DETECTED   Cocaine NONE DETECTED NONE DETECTED   Benzodiazepines NONE DETECTED NONE DETECTED   Amphetamines NONE DETECTED NONE DETECTED   Tetrahydrocannabinol NONE DETECTED NONE DETECTED   Barbiturates NONE DETECTED NONE DETECTED  Lactic acid, plasma     Status: None   Collection Time: 03/04/21  5:33 AM  Result Value Ref Range  Lactic Acid, Venous 1.6 0.5 - 1.9 mmol/L  Magnesium     Status: Abnormal   Collection Time: 03/04/21  5:33 AM  Result Value Ref Range   Magnesium 1.2 (L) 1.7 - 2.4 mg/dL  CBG monitoring, ED     Status: None   Collection Time: 03/04/21  5:39 AM  Result Value Ref Range   Glucose-Capillary 84 70 - 99 mg/dL   Comment 1 Notify RN     BMET Recent Labs     03/03/21 2155  NA 133*  K 3.2*  CL 100  CO2 21*  GLUCOSE 130*  BUN 16  CREATININE 1.05*  CALCIUM 8.5*   PT/INR No results for input(s): LABPROT, INR in the last 72 hours. ABG No results for input(s): PHART, HCO3 in the last 72 hours.  Invalid input(s): PCO2, PO2  Studies/Results: DG Chest 2 View  Result Date: 03/03/2021 CLINICAL DATA:  Chest pain EXAM: CHEST - 2 VIEW COMPARISON:  September 22, 2017 FINDINGS: The heart size and mediastinal contours are within normal limits. No focal consolidation. No pleural effusion. No pneumothorax. The visualized skeletal structures are unremarkable. IMPRESSION: No active cardiopulmonary disease. Electronically Signed   By: Maudry Mayhew MD   On: 03/03/2021 22:56   DG Abdomen 1 View  Result Date: 03/04/2021 CLINICAL DATA:  Hydronephrosis with infection.  Postvoid view. EXAM: ABDOMEN - 1 VIEW COMPARISON:  Prior abdomen and CT 03/04/2021. FINDINGS: Contrast is again noted in the renal collecting system and bladder. The patient has voided. Previously identified calcific density over the right lower pelvis is now noted. This is at the level of the right ureterovesical junction and a nonobstructing distal right ureteral stone cannot be excluded. Again a phlebolith could present in this fashion. Again no evidence of hydronephrosis or hydroureter. Lower tiny calcific density noted over the pelvis is again noted and consistent with a phlebolith. No bowel distention. No acute bony abnormality. IMPRESSION: Contrast is again noted the renal collecting system and bladder. The patient has voided. Previously identified calcific density over the right lower pelvis is now noted. This is at the level the right ureterovesical junction and a nonobstructing distal right ureteral stone cannot be excluded. Again a phlebolith could present in this fashion. Again no evidence of hydronephrosis or hydroureter. Electronically Signed   By: Maisie Fus  Register   On: 03/04/2021 06:48    DG Abd 1 View  Result Date: 03/04/2021 CLINICAL DATA:  Hydronephrosis with infection. Possible right distal renal stone. EXAM: ABDOMEN - 1 VIEW COMPARISON:  CT 03/04/2021. FINDINGS: Contrast from prior CT is noted in the renal collecting systems bilaterally and in the bladder. No hydronephrosis or hydroureter noted. Previously identified calcific density in the right lower pelvis cannot be identified due to overlying contrast filled bladder. Nonspecific air-filled loops of small bowel noted. No prominent bowel distention. No free air. Mild lumbar spine scoliosis concave left. IMPRESSION: Contrast from prior CT is noted in the renal collecting systems bilaterally and in the bladder. No hydronephrosis or hydroureter noted. Previously identified calcific density right lower pelvis cannot be identified due to overlying contrast filled bladder. However again no evidence of hydronephrosis or hydroureter. Electronically Signed   By: Maisie Fus  Register   On: 03/04/2021 06:19   CT ABDOMEN PELVIS W CONTRAST  Result Date: 03/04/2021 CLINICAL DATA:  25 year old female with tachycardia for 2 days. Lightheaded, dizziness. Nausea vomiting. EXAM: CT ABDOMEN AND PELVIS WITH CONTRAST TECHNIQUE: Multidetector CT imaging of the abdomen and pelvis was performed using the standard protocol  following bolus administration of intravenous contrast. CONTRAST:  28mL OMNIPAQUE IOHEXOL 300 MG/ML  SOLN COMPARISON:  CT Abdomen and Pelvis 07/26/2017. FINDINGS: Lower chest: Minor atelectasis at the right lung base. Otherwise negative. No cardiomegaly or pericardial effusion. Hepatobiliary: Negative liver and gallbladder. Pancreas: Negative. Spleen: Negative. Adrenals/Urinary Tract: Normal adrenal glands. The left kidney appears stable since 2018 and normal. The proximal left ureter is within normal limits. The right kidney is abnormal. The right kidney is heterogeneously enhancing and inflamed, with confluent areas of cortical  hypoenhancement in the mid pole and lower pole (coronal image 45). Associated soft tissue stranding in the right pararenal space. There is superimposed right renal collecting system and right ureter urothelial thickening and enhancement. There is right periureteral stranding proximally. The distal ureter is difficult to delineate. And there is a new 4 mm calculus in the right hemipelvis since 2018 (series 3, image 75 and coronal image 38. However, there is not significant right hydronephrosis. Otherwise unremarkable bladder. And a new midline pelvic phlebolith is noted on series 3, image 78. Stomach/Bowel: Nondilated large and small bowel. Redundant transverse colon. Negative terminal ileum. Diminutive or absent appendix with no inflammation at the tip of the cecum. Stomach and duodenum are within normal limits. No free air, free fluid. Vascular/Lymphatic: Major arterial structures are patent and appear normal. The central venous structures of the abdomen, pelvis, and portal venous system also appear patent. No lymphadenopathy. Reproductive: Negative. Other: No pelvic free fluid. Musculoskeletal: Negative. IMPRESSION: 1. Positive for Acute Right Pyelonephritis. Questionable superimposed component of obstructive uropathy, but a new 4 mm calculus in the right hemipelvis is favored to be a phlebolith rather than a distal ureteral stone given the absence of overt right hydronephrosis. No pararenal abscess. 2. Otherwise negative CT Abdomen and Pelvis. Electronically Signed   By: Odessa Fleming M.D.   On: 03/04/2021 04:28     Assessment/Plan: Sepsis with right pyelonephritis and a probable right distal stone with minimal obstruction.   With the imaging findings it will be best to proceed with cystoscopy and insertion of right ureteral stent today.  I have reviewed the risks of the procedure including bleeding, infection, ureteral injury, need for secondary procedures, thrombotic events and anesthetic complications.             CC: Dr. Ross Marcus.      Laurie Richards 03/04/2021 [phone]

## 2021-03-04 NOTE — ED Provider Notes (Addendum)
[facility] EMERGENCY DEPARTMENT Provider Note   CSN: [id number] Arrival date & time: 03/03/21  2107     History Chief Complaint  Patient presents with  . Tachycardia    Laurie Richards is a 25 y.o. female.  HPI     This a 25 year old female with a history of paroxysmal atrial fibrillation in the setting of hyperthyroidism, tobacco abuse, prior drug abuse who presents with abdominal pain, nausea, vomiting and high heart rate.  Patient reports several day history of right-sided abdominal pain.  She rates her pain at 10 out of 10.  She has had multiple episodes of nonbilious, nonbloody emesis.  Nothing seems to make her pain better or worse.  At times she reports that she has noted her heart rate in the 160s at home on her apple watch.  She has had occasional chest discomfort as well.  She recently was treated for UTI and wonders if she may have a kidney infection.  Patient reports that she was on amoxicillin.  She reports chills without documented fevers.  No known sick contacts or COVID exposures.  She reports lightheadedness and dizziness.  Past Medical History:  Diagnosis Date  . Asthma   . Irregular heartbeat    a. occasional skipped beats since age 86.  . Marijuana abuse    a. smokes 1-2 x/wk.  Marland Kitchen PAF (paroxysmal atrial fibrillation) (HCC)    a. Dx 09/2017 in setting of mild hyperthyroidism.  . Tobacco abuse    a. smoking cigarettes since age 30.  . Tooth abscess 09/2017    Patient Active Problem List   Diagnosis Date Noted  . Smoker 10/10/2017  . Hyperthyroidism 10/10/2017  . A-fib (HCC) 09/22/2017    Past Surgical History:  Procedure Laterality Date  . APPENDECTOMY    . MOUTH SURGERY       OB History    Gravida  4   Para      Term      Preterm      AB  4   Living  0     SAB  4   IAB      Ectopic      Multiple      Live Births              Family History  Problem Relation Age of Onset  . Heart failure Mother   .  Coronary artery disease Mother        Status post MI and stenting in her 30s  . Diabetes Other   . Coronary artery disease Maternal Uncle        Status post stenting at a young age  . Coronary artery disease Maternal Uncle        Status post stenting at a young age    Social History   Tobacco Use  . Smoking status: Former Smoker    Packs/day: 0.50    Types: Cigarettes  . Smokeless tobacco: Never Used  Vaping Use  . Vaping Use: Never used  Substance Use Topics  . Alcohol use: Not Currently  . Drug use: Not Currently    Home Medications Prior to Admission medications   Medication Sig Start Date End Date Taking? Authorizing Provider  acetaminophen (TYLENOL) 325 MG tablet Take 650 mg by mouth every 6 (six) hours as needed.    [provider]  amoxicillin-clavulanate (AUGMENTIN) 875-125 MG tablet Take 1 tablet by mouth every 12 (twelve) hours. 12/23/20   Wallis Bamberg, PA-C  cetirizine (ZYRTEC ALLERGY) 10 MG tablet Take 1 tablet (10 mg total) by mouth daily. 01/16/21   Wallis Bamberg, PA-C  ibuprofen (ADVIL) 600 MG tablet Take 600 mg by mouth every 6 (six) hours as needed.    [provider]  meloxicam (MOBIC) 15 MG tablet Take 1 tablet (15 mg total) by mouth daily. 12/23/20   Wallis Bamberg, PA-C  naproxen (NAPROSYN) 375 MG tablet Take 1 tablet (375 mg total) by mouth 2 (two) times daily with a meal. 01/16/21   Wallis Bamberg, PA-C  pseudoephedrine (SUDAFED) 60 MG tablet Take 1 tablet (60 mg total) by mouth every 8 (eight) hours as needed for congestion. 01/16/21   Wallis Bamberg, PA-C  diltiazem (CARDIZEM) 30 MG tablet Take 1 tablet (30 mg total) as needed by mouth. If HR more than 100 09/23/17 12/23/20  Katha Hamming, MD  metoprolol succinate (TOPROL-XL) 25 MG 24 hr tablet Take 0.5 tablets (12.5 mg total) daily by mouth. 09/24/17 12/23/20  Katha Hamming, MD    Allergies    Acetaminophen-codeine and Codeine  Review of Systems   Review of Systems  Constitutional:  Positive for chills. Negative for fever.  Respiratory: Negative for shortness of breath.   Cardiovascular: Positive for chest pain.  Gastrointestinal: Positive for abdominal pain, nausea and vomiting. Negative for diarrhea.  Genitourinary: Negative for dysuria.  Musculoskeletal: Negative for back pain.  Neurological: Positive for light-headedness.  All other systems reviewed and are negative.   Physical Exam Updated Vital Signs BP 106/65   Pulse (!) 133   Temp 99.9 F (37.7 C) (Oral)   Resp 20   LMP 02/24/2021   SpO2 100%   Physical Exam Vitals and nursing note reviewed.  Constitutional:      Appearance: She is well-developed. She is ill-appearing.     Comments: Ill-appearing but nontoxic  HENT:     Head: Normocephalic and atraumatic.     Mouth/Throat:     Mouth: Mucous membranes are dry.  Eyes:     Pupils: Pupils are equal, round, and reactive to light.  Cardiovascular:     Rate and Rhythm: Regular rhythm. Tachycardia present.     Heart sounds: Normal heart sounds. No murmur heard.   Pulmonary:     Effort: Pulmonary effort is normal. No respiratory distress.     Breath sounds: No wheezing.  Abdominal:     General: Bowel sounds are normal.     Palpations: Abdomen is soft.     Tenderness: There is abdominal tenderness. There is right CVA tenderness. There is no left CVA tenderness.     Comments: Right upper quadrant tenderness to palpation with voluntary guarding  Musculoskeletal:     Cervical back: Neck supple.     Right lower leg: No edema.     Left lower leg: No edema.  Skin:    General: Skin is warm and dry.  Neurological:     Mental Status: She is alert and oriented to person, place, and time.  Psychiatric:        Mood and Affect: Mood normal.     ED Results / Procedures / Treatments   Labs (all labs ordered are listed, but only abnormal results are displayed) Labs Reviewed  CBC - Abnormal; Notable for the following components:      Result Value    WBC 12.8 (*)    All other components within normal limits  COMPREHENSIVE METABOLIC PANEL - Abnormal; Notable for the following components:   Sodium 133 (*)  Potassium 3.2 (*)    CO2 21 (*)    Glucose, Bld 130 (*)    Creatinine, Ser 1.05 (*)    Calcium 8.5 (*)    Albumin 3.2 (*)    AST 42 (*)    ALT 45 (*)    All other components within normal limits  URINALYSIS, ROUTINE W REFLEX MICROSCOPIC - Abnormal; Notable for the following components:   APPearance CLOUDY (*)    Hgb urine dipstick MODERATE (*)    Ketones, ur 20 (*)    Protein, ur 100 (*)    Nitrite POSITIVE (*)    Leukocytes,Ua MODERATE (*)    RBC / HPF >50 (*)    WBC, UA >50 (*)    Bacteria, UA MANY (*)    Non Squamous Epithelial 0-5 (*)    All other components within normal limits  I-STAT BETA HCG BLOOD, ED ([facility], [facility], [facility] ONLY) - Abnormal; Notable for the following components:   I-stat hCG, quantitative 19.9 (*)    All other components within normal limits  RESP PANEL BY RT-PCR (FLU A&B, COVID) ARPGX2  URINE CULTURE  CULTURE, BLOOD (SINGLE)  LIPASE, BLOOD  LACTIC ACID, PLASMA  TSH  RAPID URINE DRUG SCREEN, HOSP PERFORMED  HCG, QUANTITATIVE, PREGNANCY  LACTIC ACID, PLASMA  I-STAT BETA HCG BLOOD, ED ([facility], [facility], [facility] ONLY)  TROPONIN I (HIGH SENSITIVITY)  TROPONIN I (HIGH SENSITIVITY)    EKG EKG Interpretation  Date/Time:  Monday March 03 2021 21:47:32 EDT Ventricular Rate:  148 PR Interval:  126 QRS Duration: 64 QT Interval:  270 QTC Calculation: 423 R Axis:   125 Text Interpretation: Sinus tachycardia Right axis deviation T wave abnormality, consider inferolateral ischemia Abnormal ECG Confirmed by Ross MarcusHorton, Yanette Tripoli ([id number]) on 03/03/2021 11:45:21 PM   Radiology DG Chest 2 View  Result Date: 03/03/2021 CLINICAL DATA:  Chest pain EXAM: CHEST - 2 VIEW COMPARISON:  September 22, 2017 FINDINGS: The heart size and mediastinal contours are within normal limits. No focal consolidation. No pleural effusion. No  pneumothorax. The visualized skeletal structures are unremarkable. IMPRESSION: No active cardiopulmonary disease. Electronically Signed   By: Maudry MayhewJeffrey  Waltz MD   On: 03/03/2021 22:56   CT ABDOMEN PELVIS W CONTRAST  Result Date: 03/04/2021 CLINICAL DATA:  25 year old female with tachycardia for 2 days. Lightheaded, dizziness. Nausea vomiting. EXAM: CT ABDOMEN AND PELVIS WITH CONTRAST TECHNIQUE: Multidetector CT imaging of the abdomen and pelvis was performed using the standard protocol following bolus administration of intravenous contrast. CONTRAST:  80mL OMNIPAQUE IOHEXOL 300 MG/ML  SOLN COMPARISON:  CT Abdomen and Pelvis 07/26/2017. FINDINGS: Lower chest: Minor atelectasis at the right lung base. Otherwise negative. No cardiomegaly or pericardial effusion. Hepatobiliary: Negative liver and gallbladder. Pancreas: Negative. Spleen: Negative. Adrenals/Urinary Tract: Normal adrenal glands. The left kidney appears stable since 2018 and normal. The proximal left ureter is within normal limits. The right kidney is abnormal. The right kidney is heterogeneously enhancing and inflamed, with confluent areas of cortical hypoenhancement in the mid pole and lower pole (coronal image 45). Associated soft tissue stranding in the right pararenal space. There is superimposed right renal collecting system and right ureter urothelial thickening and enhancement. There is right periureteral stranding proximally. The distal ureter is difficult to delineate. And there is a new 4 mm calculus in the right hemipelvis since 2018 (series 3, image 75 and coronal image 38. However, there is not significant right hydronephrosis. Otherwise unremarkable bladder. And a new midline pelvic phlebolith is noted on series 3, image 78.  Stomach/Bowel: Nondilated large and small bowel. Redundant transverse colon. Negative terminal ileum. Diminutive or absent appendix with no inflammation at the tip of the cecum. Stomach and duodenum are within normal  limits. No free air, free fluid. Vascular/Lymphatic: Major arterial structures are patent and appear normal. The central venous structures of the abdomen, pelvis, and portal venous system also appear patent. No lymphadenopathy. Reproductive: Negative. Other: No pelvic free fluid. Musculoskeletal: Negative. IMPRESSION: 1. Positive for Acute Right Pyelonephritis. Questionable superimposed component of obstructive uropathy, but a new 4 mm calculus in the right hemipelvis is favored to be a phlebolith rather than a distal ureteral stone given the absence of overt right hydronephrosis. No pararenal abscess. 2. Otherwise negative CT Abdomen and Pelvis. Electronically Signed   By: Odessa Fleming M.D.   On: 03/04/2021 04:28    Procedures .Critical Care Performed by: Shon Baton, MD Authorized by: Shon Baton, MD   Critical care provider statement:    Critical care time (minutes):  45   Critical care was necessary to treat or prevent imminent or life-threatening deterioration of the following conditions:  Sepsis   Critical care was time spent personally by me on the following activities:  Discussions with consultants, evaluation of patient's response to treatment, examination of patient, ordering and performing treatments and interventions, ordering and review of laboratory studies, ordering and review of radiographic studies, pulse oximetry, re-evaluation of patient's condition, obtaining history from patient or surrogate and review of old charts     Medications Ordered in ED Medications  lactated ringers infusion ( Intravenous New Bag/Given 03/04/21 0421)  sodium chloride 0.9 % bolus 1,000 mL (0 mLs Intravenous Stopped 03/04/21 0103)  ondansetron (ZOFRAN) injection 4 mg (4 mg Intravenous Given 03/04/21 0101)  sodium chloride 0.9 % bolus 1,000 mL (0 mLs Intravenous Stopped 03/04/21 0220)  acetaminophen (TYLENOL) tablet 1,000 mg (1,000 mg Oral Given 03/04/21 0102)  cefTRIAXone (ROCEPHIN) 1 g in sodium  chloride 0.9 % 100 mL IVPB (0 g Intravenous Stopped 03/04/21 0301)  sodium chloride 0.9 % bolus 1,000 mL (0 mLs Intravenous Stopped 03/04/21 0424)  morphine 4 MG/ML injection 4 mg (4 mg Intravenous Given 03/04/21 0422)  iohexol (OMNIPAQUE) 300 MG/ML solution 80 mL (80 mLs Intravenous Contrast Given 03/04/21 0414)    ED Course  I have reviewed the triage vital signs and the nursing notes.  Pertinent labs & imaging results that were available during my care of the patient were reviewed by me and considered in my medical decision making (see chart for details).  Clinical Course as of 03/04/21 0516  Tue Mar 04, 2021  0206 Patient somewhat improved on recheck.  Last menstrual period 2 weeks ago.  I-STAT beta-hCG 19.9.  We will send quantitative beta-hCG for correlation.  Urinalysis does show evidence of significant UTI.  Reports recently treated for UTI.  Highly suspect pyelonephritis given patient's clinical presentation.  She has a slight leukocytosis.  Lactate is pending.  Patient given IV Rocephin.  Cultures pending.  Additional liter of fluid also ordered as patient remains tachycardic.  Previously received a gram of Tylenol. [CH]  0450 Patient remains significantly tachycardic after a total of 3 L of fluid, pain control, and antibiotics.  Lactate is normal.  CT was obtained to rule out perinephric abscess.  CT does not show any evidence of abscess.  There is a calcification which is most likely a phlebolith although a kidney stone would be a consideration although she does not have any hydronephrosis.  No known  history of kidney stones. [CH]  6712 Spoke with Dr. Annabell Howells, urology.  Secure chat message sent with patient information.  Patient to be evaluated later this morning. [CH]    Clinical Course User Index [CH] Hazley Dezeeuw, Mayer Masker, MD   MDM Rules/Calculators/A&P                          Patient presents with right-sided abdominal and flank pain.  She is tachycardic and ill-appearing but nontoxic.   She has tenderness in the right mid abdomen and right-sided CVA tenderness.  Recent UTI.  Initial vital signs notable for tachycardia but a normal blood pressure and afebrile.  Work-up initiated from triage and reviewed.  I have added sepsis work-up to include lactate and blood cultures.  Patient was started on 2 L of fluid.  She does have a significant history of A. fib with hyperthyroidism.  TSH was also added.  She has a slight leukocytosis to 12.8 otherwise she has a slight AKI at 1.05 and LFTs are marginally elevated at 42 and 45.  Considerations include but not limited to pyelonephritis, cholecystitis, appendicitis.  Initial beta-hCG marginal at 19.  Repeat negative in the lab.  Urinalysis is nitrite positive and shows significant evidence of UTI.  Patient remains persistently tachycardic although slightly improved.  She has been well pain controlled and has had volume resuscitation.  She does not appear to be in septic shock.  CT was obtained to rule out perinephric abscess.  This is negative.  She likely also has a phlebolith although kidney stone would also be consideration.  She has no hydronephrosis on that side.  Do not feel she needs urgent urology consultation although daytime consultation with CT reviewed would be reasonable.  She remains persistent tachycardic.  For this reason, will admit for IV antibiotics and fluid resuscitation.   Final Clinical Impression(s) / ED Diagnoses Final diagnoses:  Pyelonephritis  Severe sepsis [facility])    Rx / DC Orders ED Discharge Orders    None       Cornelis Kluver, Mayer Masker, MD 03/04/21 4580    Shon Baton, MD 03/04/21 [phone]

## 2021-03-04 NOTE — Progress Notes (Signed)
G.Shalhoud paged again about HR staying at 200 at rest.

## 2021-03-04 NOTE — Progress Notes (Signed)
   03/04/21 1456  Assess: MEWS Score  Temp 97.7 F (36.5 C)  BP 98/68  Pulse Rate (!) 111  Resp 20  SpO2 99 %  O2 Device Room Air  Assess: MEWS Score  MEWS Temp 0  MEWS Systolic 1  MEWS Pulse 2  MEWS RR 0  MEWS LOC 0  MEWS Score 3  MEWS Score Color Yellow  Assess: if the MEWS score is Yellow or Red  Were vital signs taken at a resting state? Yes  Focused Assessment No change from prior assessment  Early Detection of Sepsis Score *See Row Information* Medium  MEWS guidelines implemented *See Row Information* No, other (Comment) (Pt leaving the unit for Pre op)  Treat  MEWS Interventions Other (Comment) (Previously yellow and Pt leaving for Pre op)  Pain Scale 0-10  Pain Score 3  Pain Type Acute pain  Pain Location Abdomen  Pain Orientation Right  Pain Descriptors / Indicators Sharp  Pain Frequency Constant  Pain Onset On-going  Patients Stated Pain Goal 2  Pain Intervention(s) Emotional support  Escalate  MEWS: Escalate Yellow: discuss with charge nurse/RN and consider discussing with provider and RRT  Notify: Charge Nurse/RN  Name of Charge Nurse/RN Notified Luisa RN  Date Charge Nurse/RN Notified 03/04/21  Time Charge Nurse/RN Notified 0315  Document  Patient Outcome Other (Comment) (Pt left for R stent placement)  Progress note created (see row info) Yes   Alert and oriented, pain 3/10, arrived on the unit  at 1359 recorded yellow MEWS  previously yellow in ED, scheduled for R ureter stent placement at 1600. Opt left the unit at 1456 for pre op. Charge Nurse aware.  Report given to Pre op Nurse and noted the vital signs,. In addition  advised pt has a "migraine pin that was freshly inserted and pt has stated she cant remove it.

## 2021-03-04 NOTE — Anesthesia Preprocedure Evaluation (Addendum)
Anesthesia Evaluation  Patient identified by MRN, date of birth, ID band Patient awake    Reviewed: Allergy & Precautions, NPO status , Patient's Chart, lab work & pertinent test results  History of Anesthesia Complications Negative for: history of anesthetic complications  Airway Mallampati: I  TM Distance: >3 FB Neck ROM: Full    Dental  (+) Dental Advisory Given, Poor Dentition, Chipped   Pulmonary asthma , former smoker,    Pulmonary exam normal        Cardiovascular + dysrhythmias Atrial Fibrillation  Rhythm:Regular Rate:Tachycardia     Neuro/Psych negative neurological ROS  negative psych ROS   GI/Hepatic negative GI ROS, (+)     substance abuse  marijuana use,   Endo/Other  Hyperthyroidism (TSH now wnl)  Na 133 K 3.2 Ca 8.5 Mg 1.2   Renal/GU negative Renal ROS     Musculoskeletal negative musculoskeletal ROS (+)   Abdominal   Peds  Hematology negative hematology ROS (+)   Anesthesia Other Findings Covid test negative   Reproductive/Obstetrics                            Anesthesia Physical Anesthesia Plan  ASA: II  Anesthesia Plan: General   Post-op Pain Management:    Induction: Intravenous  PONV Risk Score and Plan: 3 and Treatment may vary due to age or medical condition, Ondansetron, Dexamethasone and Midazolam  Airway Management Planned: LMA  Additional Equipment: None  Intra-op Plan:   Post-operative Plan: Extubation in OR  Informed Consent: I have reviewed the patients History and Physical, chart, labs and discussed the procedure including the risks, benefits and alternatives for the proposed anesthesia with the patient or authorized representative who has indicated his/her understanding and acceptance.     Dental advisory given  Plan Discussed with: CRNA and Anesthesiologist  Anesthesia Plan Comments:        Anesthesia Quick Evaluation

## 2021-03-04 NOTE — Progress Notes (Signed)
[id number] Laurie Richards is a 25 y.o. 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  She presents for evaluation of abdominal pain with nausea vomiting and high heart rate.  She reports that for the last 3 to 4 days she has had right-sided abdominal pain and right flank pain that is progressively worsened.  She states she has been having urinary frequency and only has a small amount of urine when she has the bathroom but has not had burning or pain when she urinates.  She has vomited multiple times over the last 2 to 3 days.  She has not been able to tolerate any p.o. intake for the last day.  Checked her heart rate at home and was running in the 150-160 range she states.  She denies having any chest pressure or tightness.    Admits to chills but has not had an outright fever.  She denies any diarrhea.  She reports that she does feel lightheaded and dizzy when she stands up quickly and attributed this to being more dehydrated with all the vomiting. She reports she is not on any medications for her heart rate or on blood thinners. She states she had an echocardiogram 5-6 years ago but none since then.   Work-up revealed right-sided sepsis secondary to UTI and right-sided pyelonephritis.  Blood cultures drawn, started on IV antibiotics empirically, on 2 g IV Rocephin.  Due to probable right distal stone she was seen by urology with plan for cystoscopy and right ureteral stent placement today.  03/04/21: Patient was seen and examined at her bedside this morning in the ED.  She reports right-sided, flank pain.  She denies having any palpitations however she is tachycardic with heart rate in the 130s on the monitor in the room.  Ongoing IV fluid hydration.  Dose of Rocephin increased to 2 g daily.  Please refer to  H&P dictated by my partner Dr. Rachael Darby on 03/04/2021 for further details of the assessment and plan.

## 2021-03-04 NOTE — Transfer of Care (Signed)
Immediate Anesthesia Transfer of Care Note  Patient: Laurie Richards  Procedure(s) Performed: CYSTOSCOPY, RETROGRADE PYELOGRAM, RIGHT STENT PLACEMENT (Right Ureter)  Patient Location: PACU  Anesthesia Type:General  Level of Consciousness: awake, alert  and oriented  Airway & Oxygen Therapy: Patient Spontanous Breathing  Post-op Assessment: Report given to RN  Post vital signs: Reviewed and stable  Last Vitals:  Vitals Value Taken Time  BP 99/68 03/04/21 1647  Temp    Pulse 113 03/04/21 1649  Resp 22 03/04/21 1649  SpO2 100 % 03/04/21 1649  Vitals shown include unvalidated device data.  Last Pain:  Vitals:   03/04/21 1456  TempSrc: Oral  PainSc: 3       Patients Stated Pain Goal: 2 (03/04/21 1456)  Complications: No complications documented.

## 2021-03-04 NOTE — Brief Op Note (Signed)
03/03/2021 - 03/04/2021  4:39 PM  PATIENT:  Laurie Richards  25 y.o. female  PRE-OPERATIVE DIAGNOSIS:  pyelonephritis  POST-OPERATIVE DIAGNOSIS:  pyelonephritis  PROCEDURE:  Procedure(s): CYSTOSCOPY, RETROGRADE PYELOGRAM, RIGHT STENT PLACEMENT (Right)  SURGEON:  Surgeon(s) and Role:    * Sebastian Ache, MD - Primary  PHYSICIAN ASSISTANT:   ASSISTANTS: none   ANESTHESIA:   general  EBL:  0 mL   BLOOD ADMINISTERED:none  DRAINS: none   LOCAL MEDICATIONS USED:  NONE  SPECIMEN:  No Specimen  DISPOSITION OF SPECIMEN:  N/A  COUNTS:  YES  TOURNIQUET:  * No tourniquets in log *  DICTATION: .Other Dictation: Dictation Number unable to obtain, but dictate at 4:40pm 03/04/21  PLAN OF CARE: Admit to inpatient   PATIENT DISPOSITION:  PACU - hemodynamically stable.   Delay start of Pharmacological VTE agent (>24hrs) due to surgical blood loss or risk of bleeding: yes

## 2021-03-04 NOTE — Progress Notes (Signed)
Rapid response notified of HR 200-210 at rest.

## 2021-03-04 NOTE — Progress Notes (Signed)
Day of Surgery   Subjective/Chief Complaint:  1 - Rt Distal Ureteral Stone - approx 50mm Rt distal fusiform ureteral stoen with minimal hydro on ER CT 02/2021 on eval Rt flank pain, fevers, malaise. Stone is solitary.   2 - Obstructing Pyelonephritis - temp 102, bacteruria, Rt distal ureteral stone c/w early obstructing pyelo. Placed on empiric rocephin.   Today "Laurie Richards" is seen to proceed with cysto, Rt ureteral stent for distal stone in setting of early infection.    Objective: Vital signs in last 24 hours: Temp:  [97.7 F (36.5 C)-101.9 F (38.8 C)] 97.7 F (36.5 C) (04/26 1456) Pulse Rate:  [105-151] 111 (04/26 1456) Resp:  [14-30] 20 (04/26 1456) BP: (89-107)/(35-80) 98/68 (04/26 1456) SpO2:  [97 %-100 %] 99 % (04/26 1456)    Intake/Output from previous day: 04/25 0701 - 04/26 0700 In: 1000 [IV Piggyback:1000] Out: -  Intake/Output this shift: Total I/O In: 962.9 [I.V.:462.9; IV Piggyback:500] Out: -   General appearance: alert and cooperative Eyes: negative Nose: Nares normal. Septum midline. Mucosa normal. No drainage or sinus tenderness. Throat: lips, mucosa, and tongue normal; teeth and gums normal Neck: supple, symmetrical, trachea midline Back: symmetric, no curvature. ROM normal. No CVA tenderness. Resp: non-labored on room air.  Cardio: sinus tach 90s.  GI: soft, non-tender; bowel sounds normal; no masses,  no organomegaly Extremities: extremities normal, atraumatic, no cyanosis or edema Skin: Skin color, texture, turgor normal. No rashes or lesions Lymph nodes: Cervical, supraclavicular, and axillary nodes normal. Neurologic: Grossly normal Very pleasant in OR holding.   Lab Results:  Recent Labs    03/03/21 2155  WBC 12.8*  HGB 13.7  HCT 41.0  PLT 185   BMET Recent Labs    03/03/21 2155  NA 133*  K 3.2*  CL 100  CO2 21*  GLUCOSE 130*  BUN 16  CREATININE 1.05*  CALCIUM 8.5*   PT/INR No results for input(s): LABPROT, INR in the last 72  hours. ABG No results for input(s): PHART, HCO3 in the last 72 hours.  Invalid input(s): PCO2, PO2  Studies/Results: DG Chest 2 View  Result Date: 03/03/2021 CLINICAL DATA:  Chest pain EXAM: CHEST - 2 VIEW COMPARISON:  September 22, 2017 FINDINGS: The heart size and mediastinal contours are within normal limits. No focal consolidation. No pleural effusion. No pneumothorax. The visualized skeletal structures are unremarkable. IMPRESSION: No active cardiopulmonary disease. Electronically Signed   By: Maudry Mayhew MD   On: 03/03/2021 22:56   DG Abdomen 1 View  Result Date: 03/04/2021 CLINICAL DATA:  Hydronephrosis with infection.  Postvoid view. EXAM: ABDOMEN - 1 VIEW COMPARISON:  Prior abdomen and CT 03/04/2021. FINDINGS: Contrast is again noted in the renal collecting system and bladder. The patient has voided. Previously identified calcific density over the right lower pelvis is now noted. This is at the level of the right ureterovesical junction and a nonobstructing distal right ureteral stone cannot be excluded. Again a phlebolith could present in this fashion. Again no evidence of hydronephrosis or hydroureter. Lower tiny calcific density noted over the pelvis is again noted and consistent with a phlebolith. No bowel distention. No acute bony abnormality. IMPRESSION: Contrast is again noted the renal collecting system and bladder. The patient has voided. Previously identified calcific density over the right lower pelvis is now noted. This is at the level the right ureterovesical junction and a nonobstructing distal right ureteral stone cannot be excluded. Again a phlebolith could present in this fashion. Again no evidence of  hydronephrosis or hydroureter. Electronically Signed   By: Maisie Fus  Register   On: 03/04/2021 06:48   DG Abd 1 View  Result Date: 03/04/2021 CLINICAL DATA:  Hydronephrosis with infection. Possible right distal renal stone. EXAM: ABDOMEN - 1 VIEW COMPARISON:  CT 03/04/2021.  FINDINGS: Contrast from prior CT is noted in the renal collecting systems bilaterally and in the bladder. No hydronephrosis or hydroureter noted. Previously identified calcific density in the right lower pelvis cannot be identified due to overlying contrast filled bladder. Nonspecific air-filled loops of small bowel noted. No prominent bowel distention. No free air. Mild lumbar spine scoliosis concave left. IMPRESSION: Contrast from prior CT is noted in the renal collecting systems bilaterally and in the bladder. No hydronephrosis or hydroureter noted. Previously identified calcific density right lower pelvis cannot be identified due to overlying contrast filled bladder. However again no evidence of hydronephrosis or hydroureter. Electronically Signed   By: Maisie Fus  Register   On: 03/04/2021 06:19   CT ABDOMEN PELVIS W CONTRAST  Result Date: 03/04/2021 CLINICAL DATA:  25 year old female with tachycardia for 2 days. Lightheaded, dizziness. Nausea vomiting. EXAM: CT ABDOMEN AND PELVIS WITH CONTRAST TECHNIQUE: Multidetector CT imaging of the abdomen and pelvis was performed using the standard protocol following bolus administration of intravenous contrast. CONTRAST:  33mL OMNIPAQUE IOHEXOL 300 MG/ML  SOLN COMPARISON:  CT Abdomen and Pelvis 07/26/2017. FINDINGS: Lower chest: Minor atelectasis at the right lung base. Otherwise negative. No cardiomegaly or pericardial effusion. Hepatobiliary: Negative liver and gallbladder. Pancreas: Negative. Spleen: Negative. Adrenals/Urinary Tract: Normal adrenal glands. The left kidney appears stable since 2018 and normal. The proximal left ureter is within normal limits. The right kidney is abnormal. The right kidney is heterogeneously enhancing and inflamed, with confluent areas of cortical hypoenhancement in the mid pole and lower pole (coronal image 45). Associated soft tissue stranding in the right pararenal space. There is superimposed right renal collecting system and right  ureter urothelial thickening and enhancement. There is right periureteral stranding proximally. The distal ureter is difficult to delineate. And there is a new 4 mm calculus in the right hemipelvis since 2018 (series 3, image 75 and coronal image 38. However, there is not significant right hydronephrosis. Otherwise unremarkable bladder. And a new midline pelvic phlebolith is noted on series 3, image 78. Stomach/Bowel: Nondilated large and small bowel. Redundant transverse colon. Negative terminal ileum. Diminutive or absent appendix with no inflammation at the tip of the cecum. Stomach and duodenum are within normal limits. No free air, free fluid. Vascular/Lymphatic: Major arterial structures are patent and appear normal. The central venous structures of the abdomen, pelvis, and portal venous system also appear patent. No lymphadenopathy. Reproductive: Negative. Other: No pelvic free fluid. Musculoskeletal: Negative. IMPRESSION: 1. Positive for Acute Right Pyelonephritis. Questionable superimposed component of obstructive uropathy, but a new 4 mm calculus in the right hemipelvis is favored to be a phlebolith rather than a distal ureteral stone given the absence of overt right hydronephrosis. No pararenal abscess. 2. Otherwise negative CT Abdomen and Pelvis. Electronically Signed   By: Odessa Fleming M.D.   On: 03/04/2021 04:28    Anti-infectives: Anti-infectives (From admission, onward)   Start     Dose/Rate Route Frequency Ordered Stop   03/04/21 1000  [MAR Hold]  cefTRIAXone (ROCEPHIN) 2 g in sodium chloride 0.9 % 100 mL IVPB        (MAR Hold since Tue 03/04/2021 at 1524.Hold Reason: Transfer to a Procedural area.)   2 g 200 mL/hr over 30  Minutes Intravenous Every 24 hours 03/04/21 0709     03/04/21 0730  cefTRIAXone (ROCEPHIN) 1 g in sodium chloride 0.9 % 100 mL IVPB  Status:  Discontinued        1 g 200 mL/hr over 30 Minutes Intravenous Every 24 hours 03/04/21 0720 03/04/21 0725   03/04/21 0215  cefTRIAXone  (ROCEPHIN) 1 g in sodium chloride 0.9 % 100 mL IVPB        1 g 200 mL/hr over 30 Minutes Intravenous  Once 03/04/21 0206 03/04/21 0301      Assessment/Plan:  Proceed as planned with cysto RIGHT ureteral stent placement with goal of renal decompresssion. RIsks, benefits, alternatives, expected peri-op course including need for staged approach with right ureteroscopy as outpatient in few weeks after clears infectious parameters discussed in detail.    Sebastian Ache 03/04/2021

## 2021-03-04 NOTE — Progress Notes (Signed)
Code Sepsis initiated @ 0209 AM. Pola Corn following.

## 2021-03-04 NOTE — Progress Notes (Signed)
Montel Culver notified of HR 200 and pt in Afib.

## 2021-03-04 NOTE — ED Notes (Signed)
Report given to [place], RN of [phone]

## 2021-03-04 NOTE — Progress Notes (Signed)
   03/04/21 1835  Assess: MEWS Score  Temp 98 F (36.7 C)  BP 105/70  Pulse Rate (!) 111  SpO2 100 %  O2 Device Room Air  Assess: MEWS Score  MEWS Temp 0  MEWS Systolic 0  MEWS Pulse 2  MEWS RR 1  MEWS LOC 0  MEWS Score 3  MEWS Score Color Yellow  Assess: if the MEWS score is Yellow or Red  Were vital signs taken at a resting state? Yes  Focused Assessment No change from prior assessment  Early Detection of Sepsis Score *See Row Information* Medium  MEWS guidelines implemented *See Row Information* Yes  Treat  MEWS Interventions Administered prn meds/treatments  Pain Scale 0-10  Pain Score 6  Pain Type Acute pain  Pain Location Abdomen  Pain Orientation Lower  Pain Descriptors / Indicators Sharp  Pain Frequency Intermittent  Pain Onset With Activity  Patients Stated Pain Goal 3  Pain Intervention(s) Medication (See eMAR)  Take Vital Signs  Increase Vital Sign Frequency  Yellow: Q 2hr X 2 then Q 4hr X 2, if remains yellow, continue Q 4hrs  Escalate  MEWS: Escalate Yellow: discuss with charge nurse/RN and consider discussing with provider and RRT  Notify: Charge Nurse/RN  Name of Charge Nurse/RN Notified Delia Heady  Date Charge Nurse/RN Notified 03/04/21  Time Charge Nurse/RN Notified 1859  Notify: Provider  Provider Name/Title Dow Adolph  Date Provider Notified 03/04/21  Time Provider Notified 1859  Notification Type Page  Notification Reason Change in status  Provider response No new orders  Date of Provider Response 03/04/21  Time of Provider Response 1900  Document  Patient Outcome Not stable and remains on department  Progress note created (see row info) Yes    A&O x4, 6/10, tramadol for pain, MD notified no new orders, yellow [facility] protocols followed,

## 2021-03-04 NOTE — Progress Notes (Addendum)
Transferred to 4E11 at this time. Report given to Stephanie(charge nurse).

## 2021-03-04 NOTE — H&P (Signed)
History and Physical    Laurie Richards WUJ:[id number] DOB: May 21, 1996 DOA: 03/03/2021  PCP: Patient, No Pcp Per (Inactive)   Patient coming from: Home  Chief Complaint: right flank pain, nausea and vomiting.  HPI: Laurie Richards is a 25 y.o. 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  She presents for evaluation of abdominal pain with nausea vomiting and high heart rate.  She reports that for the last 3 to 4 days she has had right-sided abdominal pain and right flank pain that is progressively worsened.  She states she has been having urinary frequency and only has a small amount of urine when she has the bathroom but has not had burning or pain when she urinates.  She has vomited multiple times over the last 2 to 3 days.  She has not been able to tolerate any p.o. intake for the last day.  Checked her heart rate at home and was running in the 150-160 range she states.  She denies having any chest pressure or tightness.  Chills but has not had an outright fever.  She denies any diarrhea.  She reports that she does feel lightheaded and dizzy when she stands up quickly and attributed this to being more dehydrated with all the vomiting. She reports she is not on any medications for her heart rate or on blood thinners. She states she had an echocardiogram 5-6 years ago but none since then.   ED Course: In the emergency room Ms. Yetta FlockHodges is given IV fluid hydration and started on antibiotics.  Heart rate is maintained in the 130-150 range.  Her initial lactic acid was 1.2.  TSH 0.736.  She had a i-STAT beta-hCG of 19.9 and ER physician rechecked with a blood draw and beta-hCG from the blood draw was 1.  White blood cell count is elevated at 12,800, potassium mildly decreased at 3.2.  Creatinine  1.05 and BUN of 16.  The abdomen pelvis showed right-sided pyelonephritis.  There is also a questionable 4 mm stone likely to be an obstructing stone in the distal ureter versus a phlebolith per radiology.  Urology was consulted by the ER physician was less consistent with a phlebolith and requested a stat KUB to evaluate if contrast is in the right collecting system.  Urology is going to see patient in consultation this morning  Review of Systems:  General: Reports chills but no fever. Denies weight loss, night sweats.  Denies dizziness.  Has decreased appetite HENT: Denies head trauma, headache, denies change in hearing, tinnitus. Denies nasal bleeding.  Denies sore throat, sores in mouth.  Denies difficulty swallowing Eyes: Denies blurry vision, pain in eye, drainage.  Denies discoloration of eyes. Neck: Denies pain.  Denies swelling.  Denies pain with movement. Cardiovascular: Reports palpitations. Denies chest pain.  Denies edema.  Denies orthopnea Respiratory: Denies shortness of breath, cough.  Denies wheezing.  Denies sputum production Gastrointestinal: Reports abdominal and right flank pain. Reports nausea, vomiting. Denies diarrhea.  Denies melena.  Denies hematemesis. Musculoskeletal: Denies limitation of movement.  Denies deformity or swelling.  Genitourinary: Reports urinary frequency. Denies pelvic pain. Denies dysuria.  Skin: Denies rash.  Denies petechiae, purpura, ecchymosis. Neurological: Denies syncope. Denies seizure activity. Denies paresthesia. Denies slurred speech, drooping face.  Denies visual change. Psychiatric: Denies depression, anxiety. Denies hallucinations.  Past Medical History:  Diagnosis Date  . Asthma   . Irregular heartbeat    a. occasional skipped beats since age 51.  . Marijuana abuse    a. smokes 1-2 x/wk.  Marland Kitchen PAF (paroxysmal atrial fibrillation) (HCC)    a. Dx 09/2017 in setting of mild hyperthyroidism.  . Tobacco abuse    a. smoking cigarettes since  age 64.  . Tooth abscess 09/2017    Past Surgical History:  Procedure Laterality Date  . APPENDECTOMY    . MOUTH SURGERY      Social History  reports that she has quit smoking. Her smoking use included cigarettes. She smoked 0.50 packs per day. She has never used smokeless tobacco. She reports previous alcohol use. She reports previous drug use.  Allergies  Allergen Reactions  . Acetaminophen-Codeine Itching and Swelling    Rash, vomiting if takes on empty stomach  . Codeine     nausea    Family History  Problem Relation Age of Onset  . Heart failure Mother   . Coronary artery disease Mother        Status post MI and stenting in her 30s  . Diabetes Other   . Coronary artery disease Maternal Uncle        Status post stenting at a young age  . Coronary artery disease Maternal Uncle        Status post stenting at a young age     Prior to Admission medications   Medication Sig Start Date End Date Taking? Authorizing Provider  acetaminophen (TYLENOL) 325 MG tablet Take 650 mg by mouth every 6 (six) hours as needed.    [provider]  amoxicillin-clavulanate (AUGMENTIN) 875-125 MG tablet Take 1 tablet by mouth every 12 (twelve) hours. 12/23/20   Wallis Bamberg, PA-C  cetirizine (ZYRTEC ALLERGY) 10 MG tablet Take 1 tablet (10 mg total) by mouth daily. 01/16/21   Wallis Bamberg, PA-C  ibuprofen (ADVIL) 600 MG tablet Take 600 mg by mouth every 6 (six) hours as needed.    [provider]  meloxicam (MOBIC) 15 MG tablet Take 1 tablet (15 mg total) by mouth daily. 12/23/20   Wallis Bamberg, PA-C  naproxen (NAPROSYN) 375 MG tablet Take 1 tablet (375 mg total) by mouth 2 (two) times daily with a meal. 01/16/21   Wallis Bamberg, PA-C  pseudoephedrine (SUDAFED) 60 MG tablet Take 1 tablet (60 mg total) by mouth every 8 (eight) hours as needed for congestion. 01/16/21   Wallis Bamberg, PA-C  diltiazem (CARDIZEM) 30 MG tablet Take 1 tablet (30 mg total) as needed by mouth. If HR more than 100  09/23/17 12/23/20  Katha Hamming, MD  metoprolol succinate (TOPROL-XL) 25 MG 24 hr tablet Take 0.5 tablets (12.5 mg total) daily by mouth. 09/24/17 12/23/20  Katha Hamming, MD    Physical Exam: Vitals:   03/04/21 0330 03/04/21 0345 03/04/21 0430 03/04/21 0445  BP: (!) 107/54 99/60 (!) 97/54 106/65  Pulse: (!) 122 (!) 118 (!) 120 (!) 133  Resp: 19 (!) 24 (!) 21 20  Temp:      TempSrc:      SpO2: 100% 100% 100% 100%    Constitutional: ill appearing. No respiratory distress Vitals:   03/04/21 0330 03/04/21 0345 03/04/21 0430 03/04/21 0445  BP: (!) 107/54 99/60 (!) 97/54 106/65  Pulse: (!) 122 (!) 118 (!) 120 (!) 133  Resp: 19 (!) 24 (!) 21 20  Temp:      TempSrc:  SpO2: 100% 100% 100% 100%   General: WDWN, Alert and oriented x3.  Eyes: EOMI, PERRL, conjunctivae normal.  Sclera nonicteric HENT:  /AT, external ears normal.  Nares with edematous pale boggy inferior turbinates. No epistasis.  Mucous membranes are dry Neck: Soft, normal range of motion, supple, no masses, no thyromegaly.  Trachea midline Respiratory: clear to auscultation bilaterally, no wheezing, no crackles. Normal respiratory effort. No accessory muscle use.  Cardiovascular: Regular rhythm with tachycardia, 2/6 systolic murmur. No rubs / gallops. No extremity edema. 2+ pedal pulses. Abdomen: Soft, lower abdominal tenderness to palpation, nondistended, no rebound or guarding.  No masses palpated. No hepatosplenomegaly. Bowel sounds normoactive. Right CVA tenderness to percussion. Musculoskeletal: FROM. no cyanosis. No joint deformity upper and lower extremities. Normal muscle tone.  Skin: Warm, dry, intact no rashes, lesions, ulcers. No induration Neurologic: CN 2-12 grossly intact. Normal speech. Sensation intact. Strength 5/5 in all extremities.   Psychiatric: Normal judgment and insight.  Normal mood.    Labs on Admission: I have personally reviewed following labs and imaging  studies  CBC: Recent Labs  Lab 03/03/21 2155  WBC 12.8*  HGB 13.7  HCT 41.0  MCV 83.2  PLT 185    Basic Metabolic Panel: Recent Labs  Lab 03/03/21 2155  NA 133*  K 3.2*  CL 100  CO2 21*  GLUCOSE 130*  BUN 16  CREATININE 1.05*  CALCIUM 8.5*    GFR: CrCl cannot be calculated (Unknown ideal weight.).  Liver Function Tests: Recent Labs  Lab 03/03/21 2155  AST 42*  ALT 45*  ALKPHOS 66  BILITOT 1.2  PROT 6.7  ALBUMIN 3.2*    Urine analysis:    Component Value Date/Time   COLORURINE YELLOW 03/04/2021 0139   APPEARANCEUR CLOUDY (A) 03/04/2021 0139   LABSPEC 1.015 03/04/2021 0139   PHURINE 5.0 03/04/2021 0139   GLUCOSEU NEGATIVE 03/04/2021 0139   HGBUR MODERATE (A) 03/04/2021 0139   BILIRUBINUR NEGATIVE 03/04/2021 0139   KETONESUR 20 (A) 03/04/2021 0139   PROTEINUR 100 (A) 03/04/2021 0139   UROBILINOGEN 0.2 07/20/2011 1609   NITRITE POSITIVE (A) 03/04/2021 0139   LEUKOCYTESUR MODERATE (A) 03/04/2021 0139    Radiological Exams on Admission: DG Chest 2 View  Result Date: 03/03/2021 CLINICAL DATA:  Chest pain EXAM: CHEST - 2 VIEW COMPARISON:  September 22, 2017 FINDINGS: The heart size and mediastinal contours are within normal limits. No focal consolidation. No pleural effusion. No pneumothorax. The visualized skeletal structures are unremarkable. IMPRESSION: No active cardiopulmonary disease. Electronically Signed   By: Maudry Mayhew MD   On: 03/03/2021 22:56   CT ABDOMEN PELVIS W CONTRAST  Result Date: 03/04/2021 CLINICAL DATA:  25 year old female with tachycardia for 2 days. Lightheaded, dizziness. Nausea vomiting. EXAM: CT ABDOMEN AND PELVIS WITH CONTRAST TECHNIQUE: Multidetector CT imaging of the abdomen and pelvis was performed using the standard protocol following bolus administration of intravenous contrast. CONTRAST:  32mL OMNIPAQUE IOHEXOL 300 MG/ML  SOLN COMPARISON:  CT Abdomen and Pelvis 07/26/2017. FINDINGS: Lower chest: Minor atelectasis at the  right lung base. Otherwise negative. No cardiomegaly or pericardial effusion. Hepatobiliary: Negative liver and gallbladder. Pancreas: Negative. Spleen: Negative. Adrenals/Urinary Tract: Normal adrenal glands. The left kidney appears stable since 2018 and normal. The proximal left ureter is within normal limits. The right kidney is abnormal. The right kidney is heterogeneously enhancing and inflamed, with confluent areas of cortical hypoenhancement in the mid pole and lower pole (coronal image 45). Associated soft tissue stranding in the right pararenal space.  There is superimposed right renal collecting system and right ureter urothelial thickening and enhancement. There is right periureteral stranding proximally. The distal ureter is difficult to delineate. And there is a new 4 mm calculus in the right hemipelvis since 2018 (series 3, image 75 and coronal image 38. However, there is not significant right hydronephrosis. Otherwise unremarkable bladder. And a new midline pelvic phlebolith is noted on series 3, image 78. Stomach/Bowel: Nondilated large and small bowel. Redundant transverse colon. Negative terminal ileum. Diminutive or absent appendix with no inflammation at the tip of the cecum. Stomach and duodenum are within normal limits. No free air, free fluid. Vascular/Lymphatic: Major arterial structures are patent and appear normal. The central venous structures of the abdomen, pelvis, and portal venous system also appear patent. No lymphadenopathy. Reproductive: Negative. Other: No pelvic free fluid. Musculoskeletal: Negative. IMPRESSION: 1. Positive for Acute Right Pyelonephritis. Questionable superimposed component of obstructive uropathy, but a new 4 mm calculus in the right hemipelvis is favored to be a phlebolith rather than a distal ureteral stone given the absence of overt right hydronephrosis. No pararenal abscess. 2. Otherwise negative CT Abdomen and Pelvis. Electronically Signed   By: Odessa Fleming M.D.    On: 03/04/2021 04:28    EKG: Independently reviewed.  EKG shows sinus tachycardia with heart rate in the 1 30-1 40 range.  Nonspecific ST changes but no acute ST elevation or depression.  QTc 423  Assessment/Plan Principal Problem:   Pyelonephritis Ms. Murdock is admitted to Medical Telemetry floor.  Started on Rocephin for antibiotic coverage.  Cultures were obtained in the emergency room and will be monitored. He has a questionable stone blocking the distal ureter on the right.  He has an obstructing stone which will need to be removed versus a phlebolith. Dr. Annabell Howells reviewed the CT and requests a KUB be obtained to evaluate if contrast is in right collecting system which would suggest obstruction and need for stent.  IV fluid hydration with LR at 125 ml/hr Pain control provided with tramadol every 8 hours as needed for moderate pain.  Patient be given Toradol for severe pain q. 8 hours x 2 doses if needed.  Patient has history of IV drug abuse and would like to avoid  opiate medications if possible.  Was given morphine in the emergency room and advised on that we will control her pain she is willing to take it. Check electrolytes and renal function in am. Check CBC in am  Active Problems:   Sepsis  Meets sepsis criteria with pyelonephritis with tachycardia, tachypnea and lowest MAP was 62.  Has been treated with IV fluid hydration and antibiotic.    Tachycardia Patient with a history of tachycardia.  Reports she had atrial fibrillation years ago in the setting of hyperthyroidism.  TSH is normal today.  Continues with heart rate in the 1 30-1 40 range despite IV fluid hydration. We will check echocardiogram as patient does have a faint systolic murmur    Hypokalemia Test was decreased which is most likely secondary to nausea and vomiting.  potassium will be repleted.  Check magnesium level and if low will replete.    DVT prophylaxis: Padua score low. TED hose and early ambulation for  DVT prophylaxis.  Code Status:   Full Code  Family Communication:  Diagnosis and plan discussed with patient.  Patient verbalized understanding and agrees with plan.  Further recommendations to follow as clinical indicated Disposition Plan:   Patient is from:  Home  Anticipated  DC to:  Home  Anticipated DC date:  Anticipate 2 midnight or more stay in the hospital to treat acute condition  Anticipated DC barriers: No barriers to discharge identified at this time  Consults called:  Urology, Dr. Wilson Singer, consulted by ER physician.   Admission status:  Inpatient.   Claudean Severance Arna Luis MD [facility]  How to contact the [facility] Attending or Consulting provider 7A - 7P or covering provider during after hours 7P -7A, for this patient?   1. Check the care team in [facility] and look for a) attending/consulting [facility] provider listed and b) the [facility] team listed 2. Log into [email or web address] and use [place]'s universal password to access. If you do not have the password, please contact the hospital operator. 3. Locate the [facility] provider you are looking for under [facility] and page to a number that you can be directly reached. 4. If you still have difficulty reaching the provider, please page the [facility] (Director on Call) for the Hospitalists listed on amion for assistance.  03/04/2021, 5:35 AM

## 2021-03-05 ENCOUNTER — Encounter (HOSPITAL_COMMUNITY): Payer: Self-pay | Admitting: Urology

## 2021-03-05 ENCOUNTER — Inpatient Hospital Stay (HOSPITAL_COMMUNITY): Payer: Self-pay

## 2021-03-05 DIAGNOSIS — I4891 Unspecified atrial fibrillation: Secondary | ICD-10-CM

## 2021-03-05 DIAGNOSIS — I480 Paroxysmal atrial fibrillation: Secondary | ICD-10-CM

## 2021-03-05 DIAGNOSIS — R7989 Other specified abnormal findings of blood chemistry: Secondary | ICD-10-CM

## 2021-03-05 DIAGNOSIS — N136 Pyonephrosis: Secondary | ICD-10-CM

## 2021-03-05 DIAGNOSIS — N12 Tubulo-interstitial nephritis, not specified as acute or chronic: Secondary | ICD-10-CM

## 2021-03-05 LAB — MAGNESIUM: Magnesium: 2.3 mg/dL (ref 1.7–2.4)

## 2021-03-05 LAB — ECHOCARDIOGRAM COMPLETE
AR max vel: 2.63 cm2
AV Area VTI: 3.07 cm2
AV Area mean vel: 2.65 cm2
AV Mean grad: 3 mmHg
AV Peak grad: 6 mmHg
Ao pk vel: 1.22 m/s
Area-P 1/2: 13.55 cm2
Calc EF: 35.7 %
Height: 64 in
S' Lateral: 3.8 cm
Single Plane A2C EF: 31.8 %
Single Plane A4C EF: 47.3 %
Weight: 2136 oz

## 2021-03-05 LAB — BLOOD CULTURE ID PANEL (REFLEXED) - BCID2

## 2021-03-05 LAB — CBC
HCT: 34.4 % — ABNORMAL LOW (ref 36.0–46.0)
Hemoglobin: 11.5 g/dL — ABNORMAL LOW (ref 12.0–15.0)
MCH: 28 pg (ref 26.0–34.0)
MCHC: 33.4 g/dL (ref 30.0–36.0)
MCV: 83.7 fL (ref 80.0–100.0)
Platelets: 134 10*3/uL — ABNORMAL LOW (ref 150–400)
RBC: 4.11 MIL/uL (ref 3.87–5.11)
RDW: 14.9 % (ref 11.5–15.5)
WBC: 11.6 10*3/uL — ABNORMAL HIGH (ref 4.0–10.5)
nRBC: 0 % (ref 0.0–0.2)

## 2021-03-05 LAB — D-DIMER, QUANTITATIVE: D-Dimer, Quant: 2.04 ug/mL-FEU — ABNORMAL HIGH (ref 0.00–0.50)

## 2021-03-05 LAB — BASIC METABOLIC PANEL
Anion gap: 6 (ref 5–15)
BUN: 13 mg/dL (ref 6–20)
CO2: 19 mmol/L — ABNORMAL LOW (ref 22–32)
Calcium: 7.8 mg/dL — ABNORMAL LOW (ref 8.9–10.3)
Chloride: 109 mmol/L (ref 98–111)
Creatinine, Ser: 0.78 mg/dL (ref 0.44–1.00)
GFR, Estimated: >60 mL/min (ref >60)
Glucose, Bld: 189 mg/dL — ABNORMAL HIGH (ref 70–99)
Potassium: 3.9 mmol/L (ref 3.5–5.1)
Sodium: 134 mmol/L — ABNORMAL LOW (ref 135–145)

## 2021-03-05 LAB — TROPONIN I (HIGH SENSITIVITY)
Troponin I (High Sensitivity): 105 ng/L (ref <18)
Troponin I (High Sensitivity): 90 ng/L — ABNORMAL HIGH (ref <18)

## 2021-03-05 LAB — PROTIME-INR
INR: 1.2 (ref 0.8–1.2)
Prothrombin Time: 14.8 seconds (ref 11.4–15.2)

## 2021-03-05 LAB — PROCALCITONIN: Procalcitonin: 52.13 ng/mL

## 2021-03-05 MED ORDER — POTASSIUM CHLORIDE 20 MEQ PO PACK
40.0000 meq | PACK | Freq: Once | ORAL | Status: AC
  Administered 2021-03-05: 40 meq via ORAL
  Filled 2021-03-05: qty 2

## 2021-03-05 MED ORDER — FAMOTIDINE 20 MG PO TABS
20.0000 mg | ORAL_TABLET | Freq: Two times a day (BID) | ORAL | Status: DC
  Administered 2021-03-06 – 2021-03-09 (×8): 20 mg via ORAL
  Filled 2021-03-05 (×8): qty 1

## 2021-03-05 MED ORDER — METOPROLOL TARTRATE 5 MG/5ML IV SOLN
2.5000 mg | Freq: Once | INTRAVENOUS | Status: AC
  Administered 2021-03-05: 2.5 mg via INTRAVENOUS
  Filled 2021-03-05: qty 5

## 2021-03-05 MED ORDER — DIGOXIN 0.25 MG/ML IJ SOLN
0.5000 mg | Freq: Once | INTRAMUSCULAR | Status: AC
  Administered 2021-03-05: 0.5 mg via INTRAVENOUS
  Filled 2021-03-05 (×2): qty 2

## 2021-03-05 MED ORDER — ALUM & MAG HYDROXIDE-SIMETH 200-200-20 MG/5ML PO SUSP
30.0000 mL | ORAL | Status: DC | PRN
  Administered 2021-03-06: 30 mL via ORAL
  Filled 2021-03-05: qty 30

## 2021-03-05 MED ORDER — DIGOXIN 0.25 MG/ML IJ SOLN
0.2500 mg | Freq: Four times a day (QID) | INTRAMUSCULAR | Status: AC
Start: 2021-03-05 — End: 2021-03-06
  Administered 2021-03-05 – 2021-03-06 (×4): 0.25 mg via INTRAVENOUS
  Filled 2021-03-05 (×4): qty 1

## 2021-03-05 MED ORDER — LIP MEDEX EX OINT
1.0000 "application " | TOPICAL_OINTMENT | CUTANEOUS | Status: DC | PRN
  Administered 2021-03-05: 1 via TOPICAL
  Filled 2021-03-05: qty 7

## 2021-03-05 NOTE — Progress Notes (Signed)
Patient arrived on the unit in A Fib with RVR.  Her heart rate was mostly 170s - 190s but would go up to 200s at times.  The patient was finishing her 1 L bolus of Lactated Ringer's.  After the bolus was completed, her heart rate was at 185 and blood pressure was 94/74 at 2230.  The on call physician was informed and ordered a one time dose of IV Metoprolol 2.5 mg and a 500 cc bolus of Lactated Ringer's.  An hour later, her heart rate was from 160s - 170s and BP was 96/77.  Patient was having tightness in her chest and troubling taking deep breaths. The nurse placed the patient on 2 L O2 nasal cannula to help her breathe easier.  Informed the physician, who ordered labs for Troponin level and D-Dimer.  Also, a second one time IV metoprolol 2.5 mg was ordered.  About forty minutes after the second metoprolol was given the HR was mostly from 140-160s while at rest and BP was 89/64.  The troponin level was 105 and D-Dimer was 2.04.  Nurse informed the physician, who ordered a STAT CT scan.  Will continue to monitor.  Harriet Masson, RN

## 2021-03-05 NOTE — Progress Notes (Signed)
*  PRELIMINARY RESULTS* Echocardiogram 2D Echocardiogram has been performed.  Laurie Richards 03/05/2021, 11:20 AM

## 2021-03-05 NOTE — Progress Notes (Signed)
PHARMACY - PHYSICIAN COMMUNICATION CRITICAL VALUE ALERT - BLOOD CULTURE IDENTIFICATION (BCID)  Laurie Richards is an 25 y.o. female who presented to [facility] on 03/03/2021 with a chief complaint of flank pain/N&V.   Assessment:  Pyelo, Tmax 101.9 (last temp 98.3), WBC mildly elevated  Name of physician (or Provider) Contacted: Dr. Leafy Half  Current antibiotics: Ceftriaxone 2g IV q24h  Changes to prescribed antibiotics recommended:  Patient is on recommended antibiotics - No changes needed  Results for orders placed or performed during the hospital encounter of 03/03/21  Blood Culture ID Panel (Reflexed) (Collected: 03/04/2021  2:57 AM)  Result Value Ref Range   Enterococcus faecalis NOT DETECTED NOT DETECTED   Enterococcus Faecium NOT DETECTED NOT DETECTED   Listeria monocytogenes NOT DETECTED NOT DETECTED   Staphylococcus species NOT DETECTED NOT DETECTED   Staphylococcus aureus (BCID) NOT DETECTED NOT DETECTED   Staphylococcus epidermidis NOT DETECTED NOT DETECTED   Staphylococcus lugdunensis NOT DETECTED NOT DETECTED   Streptococcus species NOT DETECTED NOT DETECTED   Streptococcus agalactiae NOT DETECTED NOT DETECTED   Streptococcus pneumoniae NOT DETECTED NOT DETECTED   Streptococcus pyogenes NOT DETECTED NOT DETECTED   A.calcoaceticus-baumannii NOT DETECTED NOT DETECTED   Bacteroides fragilis NOT DETECTED NOT DETECTED   Enterobacterales DETECTED (A) NOT DETECTED   Enterobacter cloacae complex NOT DETECTED NOT DETECTED   Escherichia coli DETECTED (A) NOT DETECTED   Klebsiella aerogenes NOT DETECTED NOT DETECTED   Klebsiella oxytoca NOT DETECTED NOT DETECTED   Klebsiella pneumoniae NOT DETECTED NOT DETECTED   Proteus species NOT DETECTED NOT DETECTED   Salmonella species NOT DETECTED NOT DETECTED   Serratia marcescens NOT DETECTED NOT DETECTED   Haemophilus influenzae NOT DETECTED NOT DETECTED   Neisseria meningitidis NOT DETECTED NOT DETECTED   Pseudomonas aeruginosa NOT  DETECTED NOT DETECTED   Stenotrophomonas maltophilia NOT DETECTED NOT DETECTED   Candida albicans NOT DETECTED NOT DETECTED   Candida auris NOT DETECTED NOT DETECTED   Candida glabrata NOT DETECTED NOT DETECTED   Candida krusei NOT DETECTED NOT DETECTED   Candida parapsilosis NOT DETECTED NOT DETECTED   Candida tropicalis NOT DETECTED NOT DETECTED   Cryptococcus neoformans/gattii NOT DETECTED NOT DETECTED   CTX-M ESBL NOT DETECTED NOT DETECTED   Carbapenem resistance IMP NOT DETECTED NOT DETECTED   Carbapenem resistance KPC NOT DETECTED NOT DETECTED   Carbapenem resistance NDM NOT DETECTED NOT DETECTED   Carbapenem resist OXA 48 LIKE NOT DETECTED NOT DETECTED   Carbapenem resistance VIM NOT DETECTED NOT DETECTED    Abran Duke 03/05/2021  12:48 AM

## 2021-03-05 NOTE — Progress Notes (Signed)
HOSPITAL MEDICINE OVERNIGHT EVENT NOTE    Contacted by nursing late yesterday evening after patient transition from sinus tachycardia she had been experiencing rhythm during the hospitalization to rapid atrial fibrillation with heart rates approaching 200 bpm.  After EKG was obtained obtained rapid atrial fibrillation, patient was transferred from her MedSurg unit to a progressive unit on 4 E.  Patient complained of some chest discomfort due to the sensation of rapid heartbeat.  She also complained of some mild shortness of breath, particularly when attempting to take a deep breath.  Patient exhibited no evidence of hypoxia.  Systolic blood pressures were low normal however, ranging between 90 and 110 systolic.  Patient initially was managed with a combination of bolus of lactated Ringer solution and multiple doses of intravenous metoprolol with minimal improvement in heart rate down to approximately the 150s to 160s.  D-dimer and troponin were ordered.  D-dimer was found to be elevated at 2.04, possibly elevated due to ongoing infection however a CT angiogram was obtained and ruled out pulmonary embolism.  Troponin was found to be slightly elevated at 105.  This is likely felt to be secondary to mild rate related supply demand mismatch and not due to plaque rupture.  Patient has had a recent TSH that is unremarkable.  With ongoing atrial fibrillation patient's systolic blood pressures remained in the 90s after doses of metoprolol.  Therefore a digoxin load has been initiated, starting with 0.5 mg of digoxin followed by an additional 0.25 mg of digoxin every 6 hours for a total of 1.5 mg.  After the first dose of digoxin patient is achieving some symptomatic relief with heart rates now in the 130s and improving systolic blood pressures.  Echocardiogram has been ordered for the morning.  Anticoagulation has not been initiated with CHA2DS2-VASc score of 1.  Continue to monitor patient closely. We will  formally consult cardiology in the morning.    Marinda Elk  MD [facility]

## 2021-03-05 NOTE — Anesthesia Postprocedure Evaluation (Signed)
Anesthesia Post Note  Patient: Laurie Richards  Procedure(s) Performed: CYSTOSCOPY, RETROGRADE PYELOGRAM, RIGHT STENT PLACEMENT (Right Ureter)     Patient location during evaluation: PACU Anesthesia Type: General Level of consciousness: awake and alert Pain management: pain level controlled Vital Signs Assessment: post-procedure vital signs reviewed and stable Respiratory status: spontaneous breathing, nonlabored ventilation and respiratory function stable Cardiovascular status: blood pressure returned to baseline and stable Postop Assessment: no apparent nausea or vomiting Anesthetic complications: no   No complications documented.  Last Vitals:  Vitals:   03/05/21 0556 03/05/21 0601  BP: (!) 88/73 100/76  Pulse: (!) 115 100  Resp: 18 19  Temp: 36.5 C 36.5 C  SpO2: 100% 99%                   Beryle Lathe

## 2021-03-05 NOTE — Progress Notes (Signed)
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  Presented with abdominal pain with nausea vomiting and tachycardia.  Also complained of dysuria.  Also complaining of vomiting multiple times.  Admits to having chills but no fever.  Work-up revealed sepsis due to UTI with right-sided pyelonephritis.  Blood cultures were obtained.  Laurie Richards started on empiric IV Rocephin.  She was seen by urology and underwent cystoscopy and right ureteral stent placement.    Subjective   Laurie Richards seen and examined, complains of lower abdominal pain.  S/p cystoscopy and right ureteral stent placement per urology.  She went into A. fib with RVR last night, she was started on p.o. digoxin due to low blood pressure.   Assessment/Plan:     1. Atrial fibrillation with RVR-Laurie Richards has history of A. fib in the past due to hyperthyroidism.  TSH is 0.736.  Laurie Richards was started on digoxin last night.  Unable to start Cardizem due to hypotension.  Will consult cardiology for further recommendations. CHA2DS2VASc score is 1 due to gender.  No anticoagulation recommended at this time. 2. Pyelonephritis/right ureteral stone-CT abdomen/pelvis showed acute pyelonephritis.  Also found to have 5 mm ureteral stone.  Urology was consulted and Laurie Richards underwent cystoscopy with right ureteral stent.  Continue Rocephin.  Urine culture growing greater than 100,000 colonies per mL of E. coli.  Follow final culture and sensitivity results. 3. Elevated D-dimer-Laurie Richards's D-dimer was elevated up to 2.04.  CTA chest obtained this morning was negative for pulmonary embolism.    Scheduled medications:   . digoxin   0.25 mg Intravenous Q6H  . fluticasone  2 spray Each Nare Daily  . loratadine  10 mg Oral Daily         Data Reviewed:   CBG:  Recent Labs  Lab 03/04/21 0539 03/04/21 2128  GLUCAP 84 173*    SpO2: 100 % O2 Flow Rate (L/min): 2 L/min    Vitals:   03/05/21 0917 03/05/21 1118 03/05/21 1125 03/05/21 1342  BP: 106/80 98/68  112/71  Pulse: (!) 164   (!) 152  Resp: 18 14  18   Temp:   98.1 F (36.7 C)   TempSrc: Oral  Oral   SpO2: 96% 97%  100%  Weight:      Height:         Intake/Output Summary (Last 24 hours) at 03/05/2021 1440 Last data filed at 03/05/2021 1333 Gross per 24 hour  Intake 4468.22 ml  Output 700 ml  Net 3768.22 ml    04/25 1901 - 04/27 0700 In: 5512.4 [P.O.:240; I.V.:3171.4] Out: 200 [Urine:200]  Filed Weights   03/04/21 2209  Weight: 60.6 kg    CBC:  Recent Labs  Lab 03/03/21 2155 03/05/21 0032  WBC 12.8* 11.6*  HGB 13.7 11.5*  HCT 41.0 34.4*  PLT 185 134*  MCV 83.2 83.7  MCH 27.8 28.0  MCHC 33.4 33.4  RDW 14.5 14.9    Complete metabolic panel:  Recent Labs  Lab 03/03/21 2155 03/04/21 0030 03/04/21 0038 03/04/21 0533 03/04/21 2204 03/05/21 0032  NA 133*  --   --   --  135 134*  K 3.2*  --   --   --  3.7 3.9  CL 100  --   --   --  107 109  CO2 21*  --   --   --  19* 19*  GLUCOSE 130*  --   --   --  170* 189*  BUN 16  --   --   --  11 13  CREATININE 1.05*  --   --   --  0.85 0.78  CALCIUM 8.5*  --   --   --  7.9* 7.8*  AST 42*  --   --   --   --   --   ALT 45*  --   --   --   --   --   ALKPHOS 66  --   --   --   --   --   BILITOT 1.2  --   --   --   --   --   ALBUMIN 3.2*  --   --   --   --   --   MG  --   --   --  1.2* 2.4 2.3  DDIMER  --   --   --   --   --  2.04*  PROCALCITON  --   --   --   --   --  52.13  LATICACIDVEN  --  1.2  --  1.6  --   --   INR  --   --   --   --   --  1.2  TSH  --   --  0.736  --   --   --     Recent Labs  Lab 03/03/21 2155  LIPASE 27    Recent Labs  Lab 03/03/21 2154  03/05/21 0032  DDIMER  --  2.04*  PROCALCITON  --  52.13  SARSCOV2NAA NEGATIVE  --     ------------------------------------------------------------------------------------------------------------------ No results for input(s): CHOL, HDL, LDLCALC, TRIG, CHOLHDL, LDLDIRECT in the last 72 hours.  No results found for: HGBA1C ------------------------------------------------------------------------------------------------------------------ Recent Labs    03/04/21 0038  TSH 0.736   ------------------------------------------------------------------------------------------------------------------ No results for input(s): VITAMINB12, FOLATE, FERRITIN, TIBC, IRON, RETICCTPCT in the last 72 hours.  Coagulation profile  Recent Labs  Lab 03/05/21 0032  INR 1.2    Recent Labs    03/05/21 0032  DDIMER 2.04*    Cardiac Enzymes  No results for input(s): CKMB, TROPONINI, MYOGLOBIN in the last 168 hours.  Invalid input(s): CK ------------------------------------------------------------------------------------------------------------------ No results found for: BNP   Antibiotics: Anti-infectives (From admission, onward)   Start     Dose/Rate Route Frequency Ordered Stop   03/04/21 1000  cefTRIAXone (ROCEPHIN) 2 g in sodium chloride 0.9 % 100 mL IVPB        2 g 200 mL/hr over 30 Minutes Intravenous Every 24 hours 03/04/21 0709     03/04/21 0730  cefTRIAXone (ROCEPHIN) 1 g in sodium chloride 0.9 % 100 mL IVPB  Status:  Discontinued        1 g 200 mL/hr over 30 Minutes Intravenous Every 24 hours 03/04/21 0720 03/04/21 0725   03/04/21 0215  cefTRIAXone (ROCEPHIN) 1 g in sodium chloride 0.9 % 100 mL IVPB        1 g 200 mL/hr over 30 Minutes Intravenous  Once 03/04/21 0206 03/04/21 0301       Radiology Reports  DG Chest 2 View  Result Date: 03/03/2021 CLINICAL DATA:  Chest pain EXAM: CHEST - 2 VIEW COMPARISON:  September 22, 2017 FINDINGS:  The heart size and mediastinal contours  are within normal limits. No focal consolidation. No pleural effusion. No pneumothorax. The visualized skeletal structures are unremarkable. IMPRESSION: No active cardiopulmonary disease. Electronically Signed   By: Maudry Mayhew MD   On: 03/03/2021 22:56   DG Abdomen 1 View  Result Date: 03/04/2021 CLINICAL DATA:  Hydronephrosis with infection.  Postvoid view. EXAM: ABDOMEN - 1 VIEW COMPARISON:  Prior abdomen and CT 03/04/2021. FINDINGS: Contrast is again noted in the renal collecting system and bladder. The Laurie Richards has voided. Previously identified calcific density over the right lower pelvis is now noted. This is at the level of the right ureterovesical junction and a nonobstructing distal right ureteral stone cannot be excluded. Again a phlebolith could present in this fashion. Again no evidence of hydronephrosis or hydroureter. Lower tiny calcific density noted over the pelvis is again noted and consistent with a phlebolith. No bowel distention. No acute bony abnormality. IMPRESSION: Contrast is again noted the renal collecting system and bladder. The Laurie Richards has voided. Previously identified calcific density over the right lower pelvis is now noted. This is at the level the right ureterovesical junction and a nonobstructing distal right ureteral stone cannot be excluded. Again a phlebolith could present in this fashion. Again no evidence of hydronephrosis or hydroureter. Electronically Signed   By: Maisie Fus  Register   On: 03/04/2021 06:48   DG Abd 1 View  Result Date: 03/04/2021 CLINICAL DATA:  Hydronephrosis with infection. Possible right distal renal stone. EXAM: ABDOMEN - 1 VIEW COMPARISON:  CT 03/04/2021. FINDINGS: Contrast from prior CT is noted in the renal collecting systems bilaterally and in the bladder. No hydronephrosis or hydroureter noted. Previously identified calcific density in the right lower pelvis cannot be identified due to overlying contrast filled bladder. Nonspecific air-filled  loops of small bowel noted. No prominent bowel distention. No free air. Mild lumbar spine scoliosis concave left. IMPRESSION: Contrast from prior CT is noted in the renal collecting systems bilaterally and in the bladder. No hydronephrosis or hydroureter noted. Previously identified calcific density right lower pelvis cannot be identified due to overlying contrast filled bladder. However again no evidence of hydronephrosis or hydroureter. Electronically Signed   By: Maisie Fus  Register   On: 03/04/2021 06:19   CT ANGIO CHEST PE W OR WO CONTRAST  Result Date: 03/05/2021 CLINICAL DATA:  25 year old female presenting with right side pyelonephritis, possible superimposed distal obstructing ureteral stone. Status post retrograde pyelogram and right ureteral stent placement yesterday. EXAM: CT ANGIOGRAPHY CHEST WITH CONTRAST TECHNIQUE: Multidetector CT imaging of the chest was performed using the standard protocol during bolus administration of intravenous contrast. Multiplanar CT image reconstructions and MIPs were obtained to evaluate the vascular anatomy. CONTRAST:  19mL OMNIPAQUE IOHEXOL 300 MG/ML  SOLN COMPARISON:  CT Abdomen and Pelvis 03/04/2021. Chest radiographs 03/03/2021. FINDINGS: Cardiovascular: Excellent contrast bolus timing in the pulmonary arterial tree. No focal filling defect identified in the pulmonary arteries to suggest acute pulmonary embolism. No left heart or arterial contrast on these images. No cardiomegaly or pericardial effusion. Mediastinum/Nodes: Negative. Lungs/Pleura: Small new layering pleural effusion since yesterday. Major airways are patent. Lower lung volumes with perihilar and dependent atelectasis. More confluent posterior basal segment lower lobe peribronchial opacity, and mild septal thickening in both lungs. No consolidation. Upper Abdomen: Negative visible noncontrast upper abdomen. Musculoskeletal: Negative. Review of the MIP images confirms the above findings. IMPRESSION: 1.  Negative for acute pulmonary embolus. 2. Small new layering pleural effusions since yesterday. Lower lung volumes with atelectasis,  and possible mild interstitial edema. No convincing pneumonia at this time. Electronically Signed   By: Odessa Fleming M.D.   On: 03/05/2021 04:23   CT ABDOMEN PELVIS W CONTRAST  Result Date: 03/04/2021 CLINICAL DATA:  25 year old female with tachycardia for 2 days. Lightheaded, dizziness. Nausea vomiting. EXAM: CT ABDOMEN AND PELVIS WITH CONTRAST TECHNIQUE: Multidetector CT imaging of the abdomen and pelvis was performed using the standard protocol following bolus administration of intravenous contrast. CONTRAST:  80mL OMNIPAQUE IOHEXOL 300 MG/ML  SOLN COMPARISON:  CT Abdomen and Pelvis 07/26/2017. FINDINGS: Lower chest: Minor atelectasis at the right lung base. Otherwise negative. No cardiomegaly or pericardial effusion. Hepatobiliary: Negative liver and gallbladder. Pancreas: Negative. Spleen: Negative. Adrenals/Urinary Tract: Normal adrenal glands. The left kidney appears stable since 2018 and normal. The proximal left ureter is within normal limits. The right kidney is abnormal. The right kidney is heterogeneously enhancing and inflamed, with confluent areas of cortical hypoenhancement in the mid pole and lower pole (coronal image 45). Associated soft tissue stranding in the right pararenal space. There is superimposed right renal collecting system and right ureter urothelial thickening and enhancement. There is right periureteral stranding proximally. The distal ureter is difficult to delineate. And there is a new 4 mm calculus in the right hemipelvis since 2018 (series 3, image 75 and coronal image 38. However, there is not significant right hydronephrosis. Otherwise unremarkable bladder. And a new midline pelvic phlebolith is noted on series 3, image 78. Stomach/Bowel: Nondilated large and small bowel. Redundant transverse colon. Negative terminal ileum. Diminutive or absent  appendix with no inflammation at the tip of the cecum. Stomach and duodenum are within normal limits. No free air, free fluid. Vascular/Lymphatic: Major arterial structures are patent and appear normal. The central venous structures of the abdomen, pelvis, and portal venous system also appear patent. No lymphadenopathy. Reproductive: Negative. Other: No pelvic free fluid. Musculoskeletal: Negative. IMPRESSION: 1. Positive for Acute Right Pyelonephritis. Questionable superimposed component of obstructive uropathy, but a new 4 mm calculus in the right hemipelvis is favored to be a phlebolith rather than a distal ureteral stone given the absence of overt right hydronephrosis. No pararenal abscess. 2. Otherwise negative CT Abdomen and Pelvis. Electronically Signed   By: Odessa Fleming M.D.   On: 03/04/2021 04:28   DG Retrograde Pyelogram  Result Date: 03/05/2021 CLINICAL DATA:  Cystoscopy Right stent placement Right ureteral calculus EXAM: RETROGRADE PYELOGRAM COMPARISON:  CT abdomen pelvis 03/04/2021 FINDINGS: Five intraoperative images were submitted for interpretation. Multiple small filling defects seen in the distal ureter on the second and third images likely due to calculi. The last 2 images demonstrate the proximal and distal ends of the ureteral stent. IMPRESSION: Intraoperative fluoroscopic images of right retrograde pyelogram as above. Electronically Signed   By: Acquanetta Belling M.D.   On: 03/05/2021 08:01   ECHOCARDIOGRAM COMPLETE  Result Date: 03/05/2021    ECHOCARDIOGRAM REPORT   Laurie Richards Name:   Laurie Richards Date of Exam: 03/05/2021 Medical Rec #:  [id number]      Height:       64.0 in Accession #:    [id number]     Weight:       133.5 lb Date of Birth:  [birth year]     BSA:          1.648 m Laurie Richards Age:    24 years       BP:           100/76 mmHg Laurie Richards Gender: F  HR:           145 bpm. Exam Location:  Inpatient Procedure: 2D Echo, Cardiac Doppler and Color Doppler Indications:    Atrial  fibrillation  History:        Laurie Richards has prior history of Echocardiogram examinations, most                 recent 09/23/2017. Arrythmias:Atrial Fibrillation.  Sonographer:    Neomia Dear RDCS Referring Phys: [id number] Deno [facility]  Sonographer Comments: No cardiac surgery noted in chart IMPRESSIONS  1. Difficult to assess LV function as in Afib with RVR to 140s during study. Left ventricular ejection fraction, by estimation, is 40 to 45%. The left ventricle has mildly decreased function. The left ventricle demonstrates global hypokinesis. There is mild left ventricular hypertrophy. Left ventricular diastolic parameters are indeterminate.  2. Right ventricular systolic function is normal. The right ventricular size is normal. There is normal pulmonary artery systolic pressure. The estimated right ventricular systolic pressure is 29.0 mmHg.  3. The mitral valve is normal in structure. Mild mitral valve regurgitation.  4. The aortic valve is tricuspid. Aortic valve regurgitation is not visualized. No aortic stenosis is present.  5. The inferior vena cava is dilated in size with <50% respiratory variability, suggesting right atrial pressure of 15 mmHg. FINDINGS  Left Ventricle: Left ventricular ejection fraction, by estimation, is 40 to 45%. The left ventricle has mildly decreased function. The left ventricle demonstrates global hypokinesis. The left ventricular internal cavity size was normal in size. There is  mild left ventricular hypertrophy. Left ventricular diastolic parameters are indeterminate. Right Ventricle: The right ventricular size is normal. No increase in right ventricular wall thickness. Right ventricular systolic function is normal. There is normal pulmonary artery systolic pressure. The tricuspid regurgitant velocity is 1.87 m/s, and  with an assumed right atrial pressure of 15 mmHg, the estimated right ventricular systolic pressure is 29.0 mmHg. Left Atrium: Left atrial size was normal in size.  Right Atrium: Right atrial size was normal in size. Pericardium: There is no evidence of pericardial effusion. Mitral Valve: The mitral valve is normal in structure. Mild mitral valve regurgitation. Tricuspid Valve: The tricuspid valve is normal in structure. Tricuspid valve regurgitation is mild. Aortic Valve: The aortic valve is tricuspid. Aortic valve regurgitation is not visualized. No aortic stenosis is present. Aortic valve mean gradient measures 3.0 mmHg. Aortic valve peak gradient measures 6.0 mmHg. Aortic valve area, by VTI measures 3.07 cm. Pulmonic Valve: The pulmonic valve was not well visualized. Pulmonic valve regurgitation is not visualized. Aorta: The aortic root is normal in size and structure. Venous: The inferior vena cava is dilated in size with less than 50% respiratory variability, suggesting right atrial pressure of 15 mmHg. IAS/Shunts: The interatrial septum was not well visualized.  LEFT VENTRICLE PLAX 2D LVIDd:         4.30 cm LVIDs:         3.80 cm LV PW:         1.00 cm LV IVS:        1.10 cm LVOT diam:     2.00 cm LV SV:         52 LV SV Index:   31 LVOT Area:     3.14 cm  LV Volumes (MOD) LV vol d, MOD A2C: 48.4 ml LV vol d, MOD A4C: 50.5 ml LV vol s, MOD A2C: 33.0 ml LV vol s, MOD A4C: 26.6 ml LV SV MOD A2C:  15.4 ml LV SV MOD A4C:     50.5 ml LV SV MOD BP:      17.7 ml RIGHT VENTRICLE RV S prime:     10.95 cm/s RVOT diam:      3.00 cm LEFT ATRIUM             Index       RIGHT ATRIUM           Index LA diam:        3.50 cm 2.12 cm/m  RA Area:     12.10 cm LA Vol (A2C):   45.1 ml 27.35 ml/m RA Volume:   28.80 ml  17.48 ml/m LA Vol (A4C):   32.0 ml 19.42 ml/m LA Biplane Vol: 31.2 ml 18.94 ml/m  AORTIC VALVE AV Area (Vmax):    2.63 cm AV Area (Vmean):   2.65 cm AV Area (VTI):     3.07 cm AV Vmax:           122.00 cm/s PULMONARY ARTERY AV Vmean:          78.600 cm/s MPA diam:        2.60 cm AV VTI:            0.169 m AV Peak Grad:      6.0 mmHg AV Mean Grad:      3.0 mmHg  LVOT Vmax:         102.00 cm/s LVOT Vmean:        66.300 cm/s LVOT VTI:          0.165 m LVOT/AV VTI ratio: 0.98  AORTA Ao Root diam: 2.40 cm Ao Asc diam:  2.10 cm MITRAL VALVE               TRICUSPID VALVE MV Area (PHT): 13.55 cm   TR Peak grad:   14.0 mmHg MV Decel Time: 56 msec     TR Vmax:        187.00 cm/s MV E velocity: 87.80 cm/s                            SHUNTS                            Systemic VTI:  0.16 m                            Systemic Diam: 2.00 cm                            Pulmonic Diam: 3.00 cm Epifanio Lescheshristopher Schumann MD Electronically signed by Epifanio Lescheshristopher Schumann MD Signature Date/Time: 03/05/2021/1:21:38 PM    Final       DVT prophylaxis: TED hose  Code Status: Full code  Family Communication: No family at bedside   Consultants:    Procedures:      Objective    Physical Examination:    General-appears in no acute distress  Heart-S1-S2, irregular, no murmur auscultated  Lungs-clear to auscultation bilaterally, no wheezing or crackles auscultated  Abdomen-soft, nontender, no organomegaly  Extremities-no edema in the lower extremities  Neuro-alert, oriented x3, no focal deficit noted   Status is: Inpatient  Dispo: The Laurie Richards is from: Home              Anticipated d/c is to: Home  Anticipated d/c date is: 03/07/2021              Laurie Richards currently not stable for discharge  Barrier to discharge-Laurie Richards on IV antibiotics for pyelonephritis, A. fib with RVR  COVID-19 Labs  Recent Labs    03/05/21 0032  DDIMER 2.04*    Lab Results  Component Value Date   SARSCOV2NAA NEGATIVE 03/03/2021   SARSCOV2NAA NEGATIVE 01/16/2021    Microbiology  Recent Results (from the past 240 hour(s))  Resp Panel by RT-PCR (Flu A&B, Covid) Nasopharyngeal Swab     Status: None   Collection Time: 03/03/21  9:54 PM   Specimen: Nasopharyngeal Swab; Nasopharyngeal(NP) swabs in vial transport medium  Result Value Ref Range Status   SARS Coronavirus  2 by RT PCR NEGATIVE NEGATIVE Final    Comment: (NOTE) SARS-CoV-2 target nucleic acids are NOT DETECTED.  The SARS-CoV-2 RNA is generally detectable in upper respiratory specimens during the acute phase of infection. The lowest concentration of SARS-CoV-2 viral copies this assay can detect is 138 copies/mL. A negative result does not preclude SARS-Cov-2 infection and should not be used as the sole basis for treatment or other Laurie Richards management decisions. A negative result may occur with  improper specimen collection/handling, submission of specimen other than nasopharyngeal swab, presence of viral mutation(s) within the areas targeted by this assay, and inadequate number of viral copies(<138 copies/mL). A negative result must be combined with clinical observations, Laurie Richards history, and epidemiological information. The expected result is Negative.  Fact Sheet for Patients:  BloggerCourse.com  Fact Sheet for Healthcare Providers:  SeriousBroker.it  This test is no t yet approved or cleared by the [place] FDA and  has been authorized for detection and/or diagnosis of SARS-CoV-2 by FDA under an Emergency Use Authorization (EUA). This EUA will remain  in effect (meaning this test can be used) for the duration of the COVID-19 declaration under Section 564(b)(1) of the Act, 21 U.S.C.section 360bbb-3(b)(1), unless the authorization is terminated  or revoked sooner.       Influenza A by PCR NEGATIVE NEGATIVE Final   Influenza B by PCR NEGATIVE NEGATIVE Final    Comment: (NOTE) The Xpert Xpress SARS-CoV-2/FLU/RSV plus assay is intended as an aid in the diagnosis of influenza from Nasopharyngeal swab specimens and should not be used as a sole basis for treatment. Nasal washings and aspirates are unacceptable for Xpert Xpress SARS-CoV-2/FLU/RSV testing.  Fact Sheet for Patients: BloggerCourse.com  Fact  Sheet for Healthcare Providers: SeriousBroker.it  This test is not yet approved or cleared by the [place] FDA and has been authorized for detection and/or diagnosis of SARS-CoV-2 by FDA under an Emergency Use Authorization (EUA). This EUA will remain in effect (meaning this test can be used) for the duration of the COVID-19 declaration under Section 564(b)(1) of the Act, 21 U.S.C. section 360bbb-3(b)(1), unless the authorization is terminated or revoked.  Performed at [facility], [address] [place], [place] [address]   Urine culture     Status: Abnormal (Preliminary result)   Collection Time: 03/04/21  1:39 AM   Specimen: Urine, Random  Result Value Ref Range Status   Specimen Description URINE, RANDOM  Final   Special Requests NONE  Final   Culture (A)  Final    >=100,000 COLONIES/mL ESCHERICHIA COLI SUSCEPTIBILITIES TO FOLLOW Performed at [facility], [address], [place], [place] [address]    Report Status PENDING  Incomplete  Culture, blood (single)     Status:  Abnormal (Preliminary result)   Collection Time: 03/04/21  2:57 AM   Specimen: BLOOD  Result Value Ref Range Status   Specimen Description BLOOD SITE NOT SPECIFIED  Final   Special Requests   Final    BOTTLES DRAWN AEROBIC AND ANAEROBIC Blood Culture results may not be optimal due to an excessive volume of blood received in culture bottles   Culture  Setup Time   Final    AEROBIC BOTTLE ONLY GRAM NEGATIVE RODS CRITICAL RESULT CALLED TO, READ BACK BY AND VERIFIED WITH: J LEDFORD PHARMD 03/05/21 0037 JDW    Culture (A)  Final    ESCHERICHIA COLI SUSCEPTIBILITIES TO FOLLOW Performed at [facility], [address], [place], [place] [address]    Report Status PENDING  Incomplete  Blood Culture ID Panel (Reflexed)     Status: Abnormal   Collection Time: 03/04/21  2:57 AM  Result Value Ref Range Status   Enterococcus faecalis NOT DETECTED NOT DETECTED Final    Enterococcus Faecium NOT DETECTED NOT DETECTED Final   Listeria monocytogenes NOT DETECTED NOT DETECTED Final   Staphylococcus species NOT DETECTED NOT DETECTED Final   Staphylococcus aureus (BCID) NOT DETECTED NOT DETECTED Final   Staphylococcus epidermidis NOT DETECTED NOT DETECTED Final   Staphylococcus lugdunensis NOT DETECTED NOT DETECTED Final   Streptococcus species NOT DETECTED NOT DETECTED Final   Streptococcus agalactiae NOT DETECTED NOT DETECTED Final   Streptococcus pneumoniae NOT DETECTED NOT DETECTED Final   Streptococcus pyogenes NOT DETECTED NOT DETECTED Final   A.calcoaceticus-baumannii NOT DETECTED NOT DETECTED Final   Bacteroides fragilis NOT DETECTED NOT DETECTED Final   Enterobacterales DETECTED (A) NOT DETECTED Final    Comment: Enterobacterales represent a large order of gram negative bacteria, not a single organism. CRITICAL RESULT CALLED TO, READ BACK BY AND VERIFIED WITH: J LEDFORD PHARMD 03/05/21 0037 JDW    Enterobacter cloacae complex NOT DETECTED NOT DETECTED Final   Escherichia coli DETECTED (A) NOT DETECTED Final    Comment: CRITICAL RESULT CALLED TO, READ BACK BY AND VERIFIED WITH: J LEDFORD PHARMD 03/05/21 0037 JDW    Klebsiella aerogenes NOT DETECTED NOT DETECTED Final   Klebsiella oxytoca NOT DETECTED NOT DETECTED Final   Klebsiella pneumoniae NOT DETECTED NOT DETECTED Final   Proteus species NOT DETECTED NOT DETECTED Final   Salmonella species NOT DETECTED NOT DETECTED Final   Serratia marcescens NOT DETECTED NOT DETECTED Final   Haemophilus influenzae NOT DETECTED NOT DETECTED Final   Neisseria meningitidis NOT DETECTED NOT DETECTED Final   Pseudomonas aeruginosa NOT DETECTED NOT DETECTED Final   Stenotrophomonas maltophilia NOT DETECTED NOT DETECTED Final   Candida albicans NOT DETECTED NOT DETECTED Final   Candida auris NOT DETECTED NOT DETECTED Final   Candida glabrata NOT DETECTED NOT DETECTED Final   Candida krusei NOT DETECTED NOT  DETECTED Final   Candida parapsilosis NOT DETECTED NOT DETECTED Final   Candida tropicalis NOT DETECTED NOT DETECTED Final   Cryptococcus neoformans/gattii NOT DETECTED NOT DETECTED Final   CTX-M ESBL NOT DETECTED NOT DETECTED Final   Carbapenem resistance IMP NOT DETECTED NOT DETECTED Final   Carbapenem resistance KPC NOT DETECTED NOT DETECTED Final   Carbapenem resistance NDM NOT DETECTED NOT DETECTED Final   Carbapenem resist OXA 48 LIKE NOT DETECTED NOT DETECTED Final   Carbapenem resistance VIM NOT DETECTED NOT DETECTED Final    Comment: Performed at [facility], [address], [place], [place] [address]  Meredeth Ide   [facility] If 7PM-7AM, please contact night-coverage at [email or web address], Office  [phone]   03/05/2021, 2:40 PM  LOS: 1 day

## 2021-03-05 NOTE — Consult Note (Addendum)
Cardiology Consultation:   Patient ID: Laurie Richards; [id number]; 19-Jun-1996   Admit date: 03/03/2021 Date of Consult: 03/05/2021  Primary Care Provider: Patient, No Pcp Per (Inactive) Primary Cardiologist: Dr. Mariah Richards (2018)  Patient Profile:   Laurie Richards is a 25 y.o. female with a hx of paroxsymal atrial fibrillation not on [facility] (dx in 2018), hyperthyroidism, tobacco and hx of IV drug use along with family hx of premature CAD who is being seen today for the evaluation of atrial fibrillation at the request of Dr. Leafy Richards.  History of Present Illness:   Laurie Richards is a 25yo F with a hx as stated above who presented to [facility] 03/04/21 with a 3 day hx of abdominal pain, nausea and vomiting along with palpitations. She states that the pain progressively worsened and she noted that her HR's were elevated in the 130 range per her Apple watch. Given her ongoing symptoms, she presented to [facility] for further evaluation. She reports that her palpitations started back when she was a child. She was previously treated with what she thinks is metoprolol when she was 25yo for palpitations and took this for several years. She cannot recall when this was stopped. She states that she was relatively stable with her palpitations until she was last seen by cardiology in 09/2017 when she was seen in hospital consultation for the evaluation of palpitations found to have atrial fibrillation. She was treated with IV digoxin and oral diltiazem. She subsequently converted to NSR. PAF was felt to be instigated by a tooth abscess and URI. On cardiology sign off, it was recommended that she transition from oral diltiazem which caused hypotension to Toprol 12-25mg  QD.  Echocardiogram was performed 09/23/2017 and showed an LVEF at 60-65% with no RWMA, normal LA and no valvular disease.   On this admission, she was initially treated with IVF hydration and started on abx. HR noted to be in the 130-150 range. HCG negative. WBC at  12.8 with K+ at 3.2. Cr was 1.05. Abdominal imaging showed  right-sided pyelonephritis.  There is also a questionable 4 mm stone likely to be an obstructing stone in the distal ureter versus a phlebolith per radiology.  Urology was consulted by the ER physician was less consistent with a phlebolith and requested a stat KUB to evaluate if contrast is in the right collecting system. The patient underwent cystoscopy and insertion of right ureteral stent patient per Urology. She was noted to be tachycardic during her stay however on 03/04/21 she went into AF with RVR with rates in the 180 range.    BPs were soft however she received multiple doses of IV Metoprolol with minimal improvement. D-dimer was found to be elevated at 2.04 with subsequent CT showing no PE. HsT was 105, likely secondary to demand ischemia in the setting of elevated rates. Primary team initiated IV digoxin with soft BPs>>loaded with 0.5mg  followed by 0.25mg  Q6H. After first dose, patient with some HR improvement.    Past Medical History:  Diagnosis Date  . Asthma   . Irregular heartbeat    a. occasional skipped beats since age 75.  . Marijuana abuse    a. smokes 1-2 x/wk.  Marland Kitchen PAF (paroxysmal atrial fibrillation) (HCC)    a. Dx 09/2017 in setting of mild hyperthyroidism.  . Tobacco abuse    a. smoking cigarettes since age 17.  . Tooth abscess 09/2017    Past Surgical History:  Procedure Laterality Date  . APPENDECTOMY    .  CYSTOSCOPY WITH URETEROSCOPY AND STENT PLACEMENT Right 03/04/2021   Procedure: CYSTOSCOPY, RETROGRADE PYELOGRAM, RIGHT STENT PLACEMENT;  Surgeon: Laurie Ache, MD;  Location: [facility] OR;  Service: Urology;  Laterality: Right;  . MOUTH SURGERY       Prior to Admission medications   Medication Sig Start Date End Date Taking? Authorizing Provider  cetirizine (ZYRTEC ALLERGY) 10 MG tablet Take 1 tablet (10 mg total) by mouth daily. Patient taking differently: Take 10 mg by mouth at bedtime. 01/16/21  Yes Laurie Bamberg, PA-C  ibuprofen (ADVIL) 600 MG tablet Take 600 mg by mouth every 6 (six) hours as needed (for pain).   Yes [provider]  naproxen sodium (ALEVE) 220 MG tablet Take 220-440 mg by mouth 2 (two) times daily as needed (for pain).   Yes [provider]  acetaminophen (TYLENOL) 325 MG tablet Take 650 mg by mouth every 6 (six) hours as needed.    [provider]  amoxicillin-clavulanate (AUGMENTIN) 875-125 MG tablet Take 1 tablet by mouth every 12 (twelve) hours. Patient not taking: No sig reported 12/23/20   Laurie Bamberg, PA-C  meloxicam (MOBIC) 15 MG tablet Take 1 tablet (15 mg total) by mouth daily. Patient not taking: No sig reported 12/23/20   Laurie Bamberg, PA-C  naproxen (NAPROSYN) 375 MG tablet Take 1 tablet (375 mg total) by mouth 2 (two) times daily with a meal. Patient not taking: Reported on 03/04/2021 01/16/21   Laurie Bamberg, PA-C  pseudoephedrine (SUDAFED) 60 MG tablet Take 1 tablet (60 mg total) by mouth every 8 (eight) hours as needed for congestion. Patient not taking: Reported on 03/04/2021 01/16/21   Laurie Bamberg, PA-C  diltiazem (CARDIZEM) 30 MG tablet Take 1 tablet (30 mg total) as needed by mouth. If HR more than 100 09/23/17 12/23/20  Katha Hamming, MD  metoprolol succinate (TOPROL-XL) 25 MG 24 hr tablet Take 0.5 tablets (12.5 mg total) daily by mouth. 09/24/17 12/23/20  Katha Hamming, MD    Inpatient Medications: Scheduled Meds: . digoxin  0.25 mg Intravenous Q6H  . fluticasone  2 spray Each Nare Daily  . loratadine  10 mg Oral Daily   Continuous Infusions: . cefTRIAXone (ROCEPHIN)  IV Stopped (03/04/21 1349)  . lactated ringers 125 mL/hr at 03/05/21 0856   PRN Meds: acetaminophen **OR** acetaminophen, HYDROmorphone (DILAUDID) injection, ondansetron **OR** ondansetron (ZOFRAN) IV, ondansetron (ZOFRAN) IV, traMADol, traMADol  Allergies:    Allergies  Allergen Reactions  . Acetaminophen-Codeine Hives, Itching, Nausea And Vomiting,  Swelling, Rash and Other (See Comments)    Mother had an anaphylactic reaction (patient stated)- And, vomiting if taken on empty stomach  . Codeine Hives, Itching, Nausea And Vomiting, Swelling, Rash and Other (See Comments)    Mother had an anaphylactic reaction (patient stated)  . Latex Rash and Other (See Comments)    Soreness, also    Social History:   Social History   Socioeconomic History  . Marital status: Single    Spouse name: Not on file  . Number of children: Not on file  . Years of education: Not on file  . Highest education level: Not on file  Occupational History  . Not on file  Tobacco Use  . Smoking status: Former Smoker    Packs/day: 0.50    Types: Cigarettes  . Smokeless tobacco: Never Used  Vaping Use  . Vaping Use: Never used  Substance and Sexual Activity  . Alcohol use: Not Currently  . Drug use: Not Currently  . Sexual activity: Yes  Birth control/protection: None  Other Topics Concern  . Not on file  Social History Narrative   Lives locally with fiance.  Does not routinely exercise.   Social Determinants of Health   Financial Resource Strain: Not on file  Food Insecurity: Not on file  Transportation Needs: Not on file  Physical Activity: Not on file  Stress: Not on file  Social Connections: Not on file  Intimate Partner Violence: Not on file    Family History:   Family History  Problem Relation Age of Onset  . Heart failure Mother   . Coronary artery disease Mother        Status post MI and stenting in her 30s  . Diabetes Other   . Coronary artery disease Maternal Uncle        Status post stenting at a young age  . Coronary artery disease Maternal Uncle        Status post stenting at a young age   Family Status:  Family Status  Relation Name Status  . Mother  Alive  . Other  (Not Specified)  . Mat Schering-PloughUncle  Alive  . Mat Uncle  Alive    ROS:  Please see the history of present illness.  All other ROS reviewed and negative.      Physical Exam/Data:   Vitals:   03/05/21 0332 03/05/21 0556 03/05/21 0601 03/05/21 0917  BP: (!) 87/67 (!) 88/73 100/76 106/80  Pulse: (!) 40 (!) 115 100 (!) 164  Resp: (!) 21 18 19 18   Temp: 98.2 F (36.8 C) 97.7 F (36.5 C) 97.7 F (36.5 C)   TempSrc: Oral Oral Oral Oral  SpO2: 100% 100% 99% 96%  Weight:      Height:        Intake/Output Summary (Last 24 hours) at 03/05/2021 1013 Last data filed at 03/05/2021 0900 Gross per 24 hour  Intake 3849.55 ml  Output 500 ml  Net 3349.55 ml   Filed Weights   03/04/21 2209  Weight: 60.6 kg   Body mass index is 22.92 kg/m.   General: Well developed, well nourished, NAD Neck: Negative for carotid bruits. No JVD Lungs:Clear to ausculation bilaterally. No wheezes, rales, or rhonchi. Breathing is unlabored. Cardiovascular: Irregularly irregular. No murmurs Abdomen: Soft, non-tender, non-distended. No obvious abdominal masses. Extremities: No edema. Radial pulses 2+ bilaterally Neuro: Alert and oriented. No focal deficits. No facial asymmetry. MAE spontaneously. Psych: Responds to questions appropriately with normal affect.     EKG:  The EKG was personally reviewed and demonstrates:  03/04/21 AF with RVR, HR 186 and no acute changes.  Telemetry:  Telemetry was personally reviewed and demonstrates: 03/05/21 AF with HR in the 110-150 range  Relevant CV Studies:  Echocardiogram 09/23/2017:  - Left ventricle: The cavity size was normal. Systolic function was  normal. The estimated ejection fraction was in the range of 60%  to 65%. Wall motion was normal; there were no regional wall  motion abnormalities. The study is not technically sufficient to  allow evaluation of LV diastolic function.  - Left atrium: The atrium was normal in size.  - Right ventricle: Systolic function was normal.  - Pulmonary arteries: Systolic pressure was within the normal  range.   Echocardiogram 03/05/21: Pending   Laboratory  Data:  Chemistry Recent Labs  Lab 03/03/21 2155 03/04/21 2204 03/05/21 0032  NA 133* 135 134*  K 3.2* 3.7 3.9  CL 100 107 109  CO2 21* 19* 19*  GLUCOSE 130* 170* 189*  BUN 16 11 13   CREATININE 1.05* 0.85 0.78  CALCIUM 8.5* 7.9* 7.8*  GFRNONAA >60 >60 >60  ANIONGAP 12 9 6     Total Protein  Date Value Ref Range Status  03/03/2021 6.7 6.5 - 8.1 g/dL Final   Albumin  Date Value Ref Range Status  03/03/2021 3.2 (L) 3.5 - 5.0 g/dL Final   AST  Date Value Ref Range Status  03/03/2021 42 (H) 15 - 41 U/L Final   ALT  Date Value Ref Range Status  03/03/2021 45 (H) 0 - 44 U/L Final   Alkaline Phosphatase  Date Value Ref Range Status  03/03/2021 66 38 - 126 U/L Final   Total Bilirubin  Date Value Ref Range Status  03/03/2021 1.2 0.3 - 1.2 mg/dL Final   Hematology Recent Labs  Lab 03/03/21 2155 03/05/21 0032  WBC 12.8* 11.6*  RBC 4.93 4.11  HGB 13.7 11.5*  HCT 41.0 34.4*  MCV 83.2 83.7  MCH 27.8 28.0  MCHC 33.4 33.4  RDW 14.5 14.9  PLT 185 134*   Cardiac EnzymesNo results for input(s): TROPONINI in the last 168 hours. No results for input(s): TROPIPOC in the last 168 hours.  BNPNo results for input(s): BNP, PROBNP in the last 168 hours.  DDimer  Recent Labs  Lab 03/05/21 0032  DDIMER 2.04*   TSH:  Lab Results  Component Value Date   TSH 0.736 03/04/2021   Lipids:No results found for: CHOL, HDL, LDLCALC, LDLDIRECT, TRIG, CHOLHDL HgbA1c:No results found for: HGBA1C  Radiology/Studies:  DG Chest 2 View  Result Date: 03/03/2021 CLINICAL DATA:  Chest pain EXAM: CHEST - 2 VIEW COMPARISON:  September 22, 2017 FINDINGS: The heart size and mediastinal contours are within normal limits. No focal consolidation. No pleural effusion. No pneumothorax. The visualized skeletal structures are unremarkable. IMPRESSION: No active cardiopulmonary disease. Electronically Signed   By: 03/05/2021 MD   On: 03/03/2021 22:56   DG Abdomen 1 View  Result Date:  03/04/2021 CLINICAL DATA:  Hydronephrosis with infection.  Postvoid view. EXAM: ABDOMEN - 1 VIEW COMPARISON:  Prior abdomen and CT 03/04/2021. FINDINGS: Contrast is again noted in the renal collecting system and bladder. The patient has voided. Previously identified calcific density over the right lower pelvis is now noted. This is at the level of the right ureterovesical junction and a nonobstructing distal right ureteral stone cannot be excluded. Again a phlebolith could present in this fashion. Again no evidence of hydronephrosis or hydroureter. Lower tiny calcific density noted over the pelvis is again noted and consistent with a phlebolith. No bowel distention. No acute bony abnormality. IMPRESSION: Contrast is again noted the renal collecting system and bladder. The patient has voided. Previously identified calcific density over the right lower pelvis is now noted. This is at the level the right ureterovesical junction and a nonobstructing distal right ureteral stone cannot be excluded. Again a phlebolith could present in this fashion. Again no evidence of hydronephrosis or hydroureter. Electronically Signed   By: 03/06/2021  Register   On: 03/04/2021 06:48   DG Abd 1 View  Result Date: 03/04/2021 CLINICAL DATA:  Hydronephrosis with infection. Possible right distal renal stone. EXAM: ABDOMEN - 1 VIEW COMPARISON:  CT 03/04/2021. FINDINGS: Contrast from prior CT is noted in the renal collecting systems bilaterally and in the bladder. No hydronephrosis or hydroureter noted. Previously identified calcific density in the right lower pelvis cannot be identified due to overlying contrast filled bladder. Nonspecific air-filled loops of small bowel noted. No prominent  bowel distention. No free air. Mild lumbar spine scoliosis concave left. IMPRESSION: Contrast from prior CT is noted in the renal collecting systems bilaterally and in the bladder. No hydronephrosis or hydroureter noted. Previously identified calcific  density right lower pelvis cannot be identified due to overlying contrast filled bladder. However again no evidence of hydronephrosis or hydroureter. Electronically Signed   By: Maisie Fus  Register   On: 03/04/2021 06:19   CT ANGIO CHEST PE W OR WO CONTRAST  Result Date: 03/05/2021 CLINICAL DATA:  25 year old female presenting with right side pyelonephritis, possible superimposed distal obstructing ureteral stone. Status post retrograde pyelogram and right ureteral stent placement yesterday. EXAM: CT ANGIOGRAPHY CHEST WITH CONTRAST TECHNIQUE: Multidetector CT imaging of the chest was performed using the standard protocol during bolus administration of intravenous contrast. Multiplanar CT image reconstructions and MIPs were obtained to evaluate the vascular anatomy. CONTRAST:  98mL OMNIPAQUE IOHEXOL 300 MG/ML  SOLN COMPARISON:  CT Abdomen and Pelvis 03/04/2021. Chest radiographs 03/03/2021. FINDINGS: Cardiovascular: Excellent contrast bolus timing in the pulmonary arterial tree. No focal filling defect identified in the pulmonary arteries to suggest acute pulmonary embolism. No left heart or arterial contrast on these images. No cardiomegaly or pericardial effusion. Mediastinum/Nodes: Negative. Lungs/Pleura: Small new layering pleural effusion since yesterday. Major airways are patent. Lower lung volumes with perihilar and dependent atelectasis. More confluent posterior basal segment lower lobe peribronchial opacity, and mild septal thickening in both lungs. No consolidation. Upper Abdomen: Negative visible noncontrast upper abdomen. Musculoskeletal: Negative. Review of the MIP images confirms the above findings. IMPRESSION: 1. Negative for acute pulmonary embolus. 2. Small new layering pleural effusions since yesterday. Lower lung volumes with atelectasis, and possible mild interstitial edema. No convincing pneumonia at this time. Electronically Signed   By: Odessa Fleming M.D.   On: 03/05/2021 04:23   CT ABDOMEN PELVIS  W CONTRAST  Result Date: 03/04/2021 CLINICAL DATA:  26 year old female with tachycardia for 2 days. Lightheaded, dizziness. Nausea vomiting. EXAM: CT ABDOMEN AND PELVIS WITH CONTRAST TECHNIQUE: Multidetector CT imaging of the abdomen and pelvis was performed using the standard protocol following bolus administration of intravenous contrast. CONTRAST:  81mL OMNIPAQUE IOHEXOL 300 MG/ML  SOLN COMPARISON:  CT Abdomen and Pelvis 07/26/2017. FINDINGS: Lower chest: Minor atelectasis at the right lung base. Otherwise negative. No cardiomegaly or pericardial effusion. Hepatobiliary: Negative liver and gallbladder. Pancreas: Negative. Spleen: Negative. Adrenals/Urinary Tract: Normal adrenal glands. The left kidney appears stable since 2018 and normal. The proximal left ureter is within normal limits. The right kidney is abnormal. The right kidney is heterogeneously enhancing and inflamed, with confluent areas of cortical hypoenhancement in the mid pole and lower pole (coronal image 45). Associated soft tissue stranding in the right pararenal space. There is superimposed right renal collecting system and right ureter urothelial thickening and enhancement. There is right periureteral stranding proximally. The distal ureter is difficult to delineate. And there is a new 4 mm calculus in the right hemipelvis since 2018 (series 3, image 75 and coronal image 38. However, there is not significant right hydronephrosis. Otherwise unremarkable bladder. And a new midline pelvic phlebolith is noted on series 3, image 78. Stomach/Bowel: Nondilated large and small bowel. Redundant transverse colon. Negative terminal ileum. Diminutive or absent appendix with no inflammation at the tip of the cecum. Stomach and duodenum are within normal limits. No free air, free fluid. Vascular/Lymphatic: Major arterial structures are patent and appear normal. The central venous structures of the abdomen, pelvis, and portal venous system also appear  patent.  No lymphadenopathy. Reproductive: Negative. Other: No pelvic free fluid. Musculoskeletal: Negative. IMPRESSION: 1. Positive for Acute Right Pyelonephritis. Questionable superimposed component of obstructive uropathy, but a new 4 mm calculus in the right hemipelvis is favored to be a phlebolith rather than a distal ureteral stone given the absence of overt right hydronephrosis. No pararenal abscess. 2. Otherwise negative CT Abdomen and Pelvis. Electronically Signed   By: Odessa Fleming M.D.   On: 03/04/2021 04:28   DG Retrograde Pyelogram  Result Date: 03/05/2021 CLINICAL DATA:  Cystoscopy Right stent placement Right ureteral calculus EXAM: RETROGRADE PYELOGRAM COMPARISON:  CT abdomen pelvis 03/04/2021 FINDINGS: Five intraoperative images were submitted for interpretation. Multiple small filling defects seen in the distal ureter on the second and third images likely due to calculi. The last 2 images demonstrate the proximal and distal ends of the ureteral stent. IMPRESSION: Intraoperative fluoroscopic images of right retrograde pyelogram as above. Electronically Signed   By: Acquanetta Belling M.D.   On: 03/05/2021 08:01   Assessment and Plan:   1. Paroxysmal atrial fibrillation: -Pt has a known hx of PAF dating back to 09/2017 after being admitted for URI and found to have AF with RVR. At that time, she was treated with digoxin and diltiazem and converted to NSR. Plan was to transition to Toprol QD and PRN diltiazem at cardiology sign off. She was not started on anticoagulation given a CHA2DS2VASc score of 1 (gender). Unfortunately she has been lost to follow up since that time and reports that she has not been on any home medications.  -Pt presented with acute abdominal pain found to have ureteral obstruction>>Urology consulted and patient underwent cystoscopy and insertion of right ureteral stent. She remained in sinus tachycardia until 03/04/21 when she was found to be in AF with RVR. -BPs have been soft  however she received multiple doses of IV Metoprolol with minimal improvement. D-dimer was found to be elevated at 2.04 with subsequent CT showing no PE.  -HsT was 105>>>90, likely secondary to demand ischemia in the setting of elevated rates>>no recurrent chest pain.  -Primary team initiated IV digoxin with soft BPs>>loaded with 0.5mg  followed by 0.25mg  Q6H. After first dose, patient with some HR improvement to the 110-150 range -Prior echocardiogram from 09/2017 with LVEF at 60-65% with no RWMA, normal LA and no valvular disease.  -Primary team ordered repeat echocardiogram this admission with pending results.   -Will plan to continue digoxin for rate control at this time. Hopeful that BPs will improve after resolution of acute infection. At that time, would recommend metoprolol 12.5 or 25mg  BID.  -Would also recommend following with a cardiac monitor to   evaluate AF burden -No AC due to CHA2DS2VASc score of 1 however will need to readdress this as she ages and accumulates more co morbidities.    2. Acute/obstructing pyelonephritis with right distal ureteral stone: -Pt presented with a 3 days hx of worsening abdominal pain, nausea and vomiting found to have a questionable obstructing stone in the distal right ureter. Urology was consulted with plans to proceed with cystoscopy and stenting performed 03/03/21.  -Management per primary and urology teams  3. Hypokalemia: -K+, 3.9 today  -Replaced by primary team -Continue to monitor with daily BMET     For questions or updates, please contact [facility] Please consult [email or web address] for contact info under Cardiology/STEMI.   Raliegh Ip NP-C [facility] Pager: [phone] 03/05/2021 10:13 AM   Personally seen and examined. Agree with above.  25 year old with acute obstructing  pyelonephritis with right distal ureteral stone with abdominal pain vomiting.  Ureteral stenting took place.  Difficult to control atrial fibrillation.   Currently she is fairly comfortable in bed.  Does have some increased work of breathing at times sometimes associated with anxiety.  Lungs are clear, thin, no edema, heart irregularly irregular without any murmurs.  Echocardiogram personally reviewed-I think her EF is likely normal although challenging given the rapid ventricular response on her atrial fibrillation.  Labs are unremarkable.  Continue to replete potassium.  Assessment and plan:  Paroxysmal atrial fibrillation - Had a similar episode a few years back with upper respiratory infection. -Agree with digoxin use at this time given her soft blood pressure.  Continue with hydration.  I believe that once her infection settles down, her atrial fibrillation will follow and she will convert. - No need for anticoagulation given her low chads vascular score of 1. -Given these repeated bouts, if she develops atrial fibrillation once again without any obvious triggers such as infection, she may be an excellent ablative candidate given her structurally normal heart.  Elevated troponin - 105 secondary to demand ischemia with RVR.  Donato Schultz, MD

## 2021-03-05 NOTE — Progress Notes (Signed)
Date and time results received: 03/05/21 0137 (use smartphrase ".now" to insert current time)  Test: Troponin and D-Dimer Critical Value: Troponin was 105 and D-Dimer was 2.04  Name of Provider Notified: Shauna Hugh, MD  Orders Received? Or Actions Taken?: STAT CT of Chest

## 2021-03-05 NOTE — Progress Notes (Signed)
Patient requesting medication for anxiety. MD on call made aware. No new orders received at this time. Will Monitor patient.  Carnita Golob, Randall An RN

## 2021-03-05 NOTE — Op Note (Signed)
[name], Laurie Richards MEDICAL RECORD NO: [id number] ACCOUNT NO: [id number] DATE OF BIRTH: 27-Aug-1996 FACILITY: [facility] LOCATION: [id number] PHYSICIAN: Sebastian Ache, MD  Operative Report   DATE OF PROCEDURE: 03/04/2021  PREOPERATIVE DIAGNOSIS:  Right ureteral stone with early pyelonephritis.  PROCEDURES PERFORMED:  1.  Cystoscopy with right retrograde  pyelogram interpretation. 2.  Insertion of right ureteral stent, 6 x 24 Contour, no tether.  ESTIMATED BLOOD LOSS:  Nil.  COMPLICATIONS:  None.  SPECIMENS:  None.  FINDINGS:   1.  Mild hydronephrosis to right distal ureteral stone. 2.  Successful placement of right ureteral stent, proximal end in the renal pelvis, distal end in the urinary bladder.  INDICATIONS:  The patient is a very pleasant 25 year old young woman with recent history of a right distal ureteral stone.  This was found on the workup of colicky flank pain as well as some fevers and tachycardia.  She is not hypotensive and tachycardia  has responded well to fluids.  However, she does have some significant infectious parameters, worrisome for early obstructing pyelonephritis.  Options were discussed including recommended path of right ureteral stent in the acute setting, followed by  ureteroscopy in several weeks after she clears infections parameters, and she wished to proceed.  Informed consent was obtained and placed in the medical record.  DESCRIPTION OF PROCEDURE:  The patient being herself verified, procedure being right ureteral stent placement with retrograde pyelogram confirmed. Procedure time-out was performed.  Intravenous access administered.  General LMA anesthesia induced.  The  patient was placed into a low lithotomy position, sterile field was created, prepped and draped patient's vagina, introitus and proximal thighs using iodine.  Cystourethroscopy was performed with a 21-French cystoscope with offset lens.  Inspection of  bladder revealed no diverticula,  calcifications or papillary lesions.  The right ureteral orifice is somewhat edematous.  The right ureteral orifice was cannulated with a 6-French renal catheter. and a right retrograde pyelogram was obtained.  Right retrograde pyelogram demonstrated a single right ureter with a single system right kidney.  There was a calcification, then filling defect in the right distal ureter consistent with known stone.  There was minimal hydronephrosis above this.  A  0.038 ZIPwire was then advanced to the level of the lower pole over which a 6 x 24 Contour-type stent was placed using fluoroscopic guidance.  Good proximal and distal ends were noted and the procedure was terminated.  The patient tolerated the procedure  well.  No immediate complications.  The patient was taken to postanesthesia care unit in stable condition with plan for inpatient admission, verify afebrile x24 hours before discharge home.   PAA D: 03/04/2021 4:39:27 pm T: 03/05/2021 6:51:00 am  JOB: [id number]/ [id number]

## 2021-03-05 NOTE — Progress Notes (Signed)
[facility] night shift.  The nursing staff reported that the patient was requesting something for heartburn.  Famotidine 20 mg p.o. twice daily and Maalox 30 mL PO every 6 hours as needed ordered.  Sanda Klein, MD.

## 2021-03-06 DIAGNOSIS — I4891 Unspecified atrial fibrillation: Secondary | ICD-10-CM

## 2021-03-06 LAB — CULTURE, BLOOD (SINGLE)

## 2021-03-06 LAB — BASIC METABOLIC PANEL
Anion gap: 5 (ref 5–15)
BUN: 11 mg/dL (ref 6–20)
CO2: 26 mmol/L (ref 22–32)
Calcium: 7.9 mg/dL — ABNORMAL LOW (ref 8.9–10.3)
Chloride: 106 mmol/L (ref 98–111)
Creatinine, Ser: 0.73 mg/dL (ref 0.44–1.00)
GFR, Estimated: >60 mL/min (ref >60)
Glucose, Bld: 117 mg/dL — ABNORMAL HIGH (ref 70–99)
Potassium: 3.9 mmol/L (ref 3.5–5.1)
Sodium: 137 mmol/L (ref 135–145)

## 2021-03-06 LAB — URINE CULTURE: Culture: >=100000 — AB

## 2021-03-06 MED ORDER — CYCLOBENZAPRINE HCL 10 MG PO TABS
5.0000 mg | ORAL_TABLET | Freq: Three times a day (TID) | ORAL | Status: DC | PRN
  Administered 2021-03-06 – 2021-03-09 (×7): 5 mg via ORAL
  Filled 2021-03-06 (×7): qty 1

## 2021-03-06 MED ORDER — METOPROLOL TARTRATE 5 MG/5ML IV SOLN
5.0000 mg | Freq: Three times a day (TID) | INTRAVENOUS | Status: DC
  Administered 2021-03-06 – 2021-03-07 (×5): 5 mg via INTRAVENOUS
  Filled 2021-03-06 (×6): qty 5

## 2021-03-06 MED ORDER — ALPRAZOLAM 0.5 MG PO TABS
0.5000 mg | ORAL_TABLET | Freq: Three times a day (TID) | ORAL | Status: DC | PRN
  Administered 2021-03-06: 0.5 mg via ORAL
  Filled 2021-03-06: qty 1

## 2021-03-06 MED ORDER — DIGOXIN 125 MCG PO TABS
0.1250 mg | ORAL_TABLET | Freq: Every day | ORAL | Status: DC
  Administered 2021-03-06 – 2021-03-09 (×4): 0.125 mg via ORAL
  Filled 2021-03-06 (×4): qty 1

## 2021-03-06 MED ORDER — APIXABAN 5 MG PO TABS
5.0000 mg | ORAL_TABLET | Freq: Two times a day (BID) | ORAL | Status: DC
  Administered 2021-03-06 – 2021-03-09 (×6): 5 mg via ORAL
  Filled 2021-03-06 (×6): qty 1

## 2021-03-06 NOTE — Progress Notes (Signed)
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  Presented with abdominal pain with nausea vomiting and tachycardia.  Also complained of dysuria.  Also complaining of vomiting multiple times.  Admits to having chills but no fever.  Work-up revealed sepsis due to UTI with right-sided pyelonephritis.  Blood cultures were obtained.  Patient started on empiric IV Rocephin.  She was seen by urology and underwent cystoscopy and right ureteral stent placement.    Subjective   Patient seen and examined, complains of anxiety this morning.  Still having rapid ventricular rate despite being on IV digoxin, which was started by cardiology.   Assessment/Plan:     1. Atrial fibrillation with RVR-patient has history of A. fib in the past due to hyperthyroidism.  TSH is 0.736.  Patient was started on digoxin last night.  Unable to start Cardizem due to hypotension.  Cardiology was consulted, started on  digoxin.  Today cardiology added metoprolol 5 mg IV every 8 hours.  CHA2DS2VASc score is 1 due to gender.  No anticoagulation recommended at this time.  Echocardiogram showed EF 40 to 45%, mildly decreased left ventricular function.  Left ventricle demonstrates global hypokinesis.  2. Anxiety-we will give 1 dose of Xanax 0.5 mg p.o. x1.  3. Pyelonephritis/right ureteral stone-CT abdomen/pelvis showed acute pyelonephritis.  Also found to have 5 mm ureteral stone.  Urology was consulted and patient underwent cystoscopy with right ureteral stent.  Continue Rocephin.  Both urine and blood culture growing E. coli, sensitive to ceftriaxone and cefazolin.  Patient is currently on IV  ceftriaxone.  We will check with ID regarding switching her to p.o. antibiotics.  4. Elevated D-dimer-patient's D-dimer was elevated up to 2.04.  CTA chest obtained was negative for pulmonary embolism.    Scheduled medications:   . digoxin  0.125 mg Oral Daily  . famotidine  20 mg Oral BID  . fluticasone  2 spray Each Nare Daily  . loratadine  10 mg Oral Daily  . metoprolol tartrate  5 mg Intravenous Q8H         Data Reviewed:   CBG:  Recent Labs  Lab 03/04/21 0539 03/04/21 2128  GLUCAP 84 173*    SpO2: 98 % O2 Flow Rate (L/min): 2 L/min    Vitals:   03/06/21 0941 03/06/21 1012 03/06/21 1314 03/06/21 1316  BP: 111/68 120/64  (!) 98/53  Pulse: 100 96 (!) 127 (!) 101  Resp: 13 17  20   Temp: 98.8 F (37.1 C) 98.8 F (37.1 C)  98.8 F (37.1 C)  TempSrc:  Oral  Oral  SpO2: 96% 95%  98%  Weight:      Height:         Intake/Output Summary (Last 24 hours) at 03/06/2021 1631 Last data filed at 03/06/2021 1500 Gross per 24 hour  Intake 100 ml  Output 4150 ml  Net -4050 ml    04/26 1901 - 04/28 0700 In: 3828.2 [I.V.:3127.1] Out: 2600 [Urine:2600]  Filed Weights   03/04/21 2209  Weight: 60.6 kg    CBC:  Recent Labs  Lab 03/03/21 2155 03/05/21 0032  WBC 12.8* 11.6*  HGB 13.7 11.5*  HCT 41.0 34.4*  PLT 185 134*  MCV 83.2 83.7  MCH 27.8 28.0  MCHC 33.4 33.4  RDW 14.5 14.9    Complete metabolic panel:  Recent Labs  Lab 03/03/21 2155 03/04/21 0030 03/04/21 0038 03/04/21 0533 03/04/21 2204 03/05/21 0032 03/06/21 0131  NA 133*  --   --   --  135 134* 137  K 3.2*  --   --   --  3.7 3.9 3.9  CL 100  --   --   --  107 109 106  CO2 21*  --   --   --  19* 19* 26  GLUCOSE 130*  --   --   --  170* 189* 117*  BUN 16  --   --   --  11 13 11   CREATININE 1.05*  --   --   --  0.85 0.78 0.73  CALCIUM 8.5*  --   --   --  7.9* 7.8* 7.9*  AST 42*  --   --   --   --   --   --   ALT 45*  --   --   --   --   --   --   ALKPHOS 66  --   --   --   --   --    --   BILITOT 1.2  --   --   --   --   --   --   ALBUMIN 3.2*  --   --   --   --   --   --   MG  --   --   --  1.2* 2.4 2.3  --   DDIMER  --   --   --   --   --  2.04*  --   PROCALCITON  --   --   --   --   --  52.13  --   LATICACIDVEN  --  1.2  --  1.6  --   --   --   INR  --   --   --   --   --  1.2  --   TSH  --   --  0.736  --   --   --   --     Recent Labs  Lab 03/03/21 2155  LIPASE 27    Recent Labs  Lab 03/03/21 2154 03/05/21 0032  DDIMER  --  2.04*  PROCALCITON  --  52.13  SARSCOV2NAA NEGATIVE  --     ------------------------------------------------------------------------------------------------------------------ No results for input(s): CHOL, HDL, LDLCALC, TRIG, CHOLHDL, LDLDIRECT in the last 72 hours.  No results found for: HGBA1C ------------------------------------------------------------------------------------------------------------------ Recent Labs    03/04/21 0038  TSH 0.736   ------------------------------------------------------------------------------------------------------------------ No results for input(s): VITAMINB12, FOLATE, FERRITIN, TIBC, IRON, RETICCTPCT in the last 72 hours.  Coagulation profile  Recent Labs  Lab 03/05/21 0032  INR 1.2    Recent Labs    03/05/21 0032  DDIMER 2.04*    Cardiac Enzymes  No results for input(s): CKMB, TROPONINI, MYOGLOBIN in the last 168 hours.  Invalid input(s): CK ------------------------------------------------------------------------------------------------------------------ No results found for: BNP   Antibiotics: Anti-infectives (From admission, onward)   Start     Dose/Rate Route Frequency Ordered Stop   03/04/21 1000  cefTRIAXone (ROCEPHIN) 2 g in sodium chloride 0.9 % 100 mL IVPB        2 g 200 mL/hr over 30 Minutes Intravenous Every 24 hours 03/04/21 0709     03/04/21 0730  cefTRIAXone (ROCEPHIN)  1 g in sodium chloride 0.9 % 100 mL IVPB  Status:  Discontinued        1 g 200  mL/hr over 30 Minutes Intravenous Every 24 hours 03/04/21 0720 03/04/21 0725   03/04/21 0215  cefTRIAXone (ROCEPHIN) 1 g in sodium chloride 0.9 % 100 mL IVPB        1 g 200 mL/hr over 30 Minutes Intravenous  Once 03/04/21 0206 03/04/21 0301       Radiology Reports  CT ANGIO CHEST PE W OR WO CONTRAST  Result Date: 03/05/2021 CLINICAL DATA:  25 year old female presenting with right side pyelonephritis, possible superimposed distal obstructing ureteral stone. Status post retrograde pyelogram and right ureteral stent placement yesterday. EXAM: CT ANGIOGRAPHY CHEST WITH CONTRAST TECHNIQUE: Multidetector CT imaging of the chest was performed using the standard protocol during bolus administration of intravenous contrast. Multiplanar CT image reconstructions and MIPs were obtained to evaluate the vascular anatomy. CONTRAST:  40mL OMNIPAQUE IOHEXOL 300 MG/ML  SOLN COMPARISON:  CT Abdomen and Pelvis 03/04/2021. Chest radiographs 03/03/2021. FINDINGS: Cardiovascular: Excellent contrast bolus timing in the pulmonary arterial tree. No focal filling defect identified in the pulmonary arteries to suggest acute pulmonary embolism. No left heart or arterial contrast on these images. No cardiomegaly or pericardial effusion. Mediastinum/Nodes: Negative. Lungs/Pleura: Small new layering pleural effusion since yesterday. Major airways are patent. Lower lung volumes with perihilar and dependent atelectasis. More confluent posterior basal segment lower lobe peribronchial opacity, and mild septal thickening in both lungs. No consolidation. Upper Abdomen: Negative visible noncontrast upper abdomen. Musculoskeletal: Negative. Review of the MIP images confirms the above findings. IMPRESSION: 1. Negative for acute pulmonary embolus. 2. Small new layering pleural effusions since yesterday. Lower lung volumes with atelectasis, and possible mild interstitial edema. No convincing pneumonia at this time. Electronically Signed   By: Odessa Fleming M.D.   On: 03/05/2021 04:23   DG Retrograde Pyelogram  Result Date: 03/05/2021 CLINICAL DATA:  Cystoscopy Right stent placement Right ureteral calculus EXAM: RETROGRADE PYELOGRAM COMPARISON:  CT abdomen pelvis 03/04/2021 FINDINGS: Five intraoperative images were submitted for interpretation. Multiple small filling defects seen in the distal ureter on the second and third images likely due to calculi. The last 2 images demonstrate the proximal and distal ends of the ureteral stent. IMPRESSION: Intraoperative fluoroscopic images of right retrograde pyelogram as above. Electronically Signed   By: Acquanetta Belling M.D.   On: 03/05/2021 08:01   ECHOCARDIOGRAM COMPLETE  Result Date: 03/05/2021    ECHOCARDIOGRAM REPORT   Patient Name:   WYNEE NUDD Date of Exam: 03/05/2021 Medical Rec #:  [id number]      Height:       64.0 in Accession #:    [id number]     Weight:       133.5 lb Date of Birth:  09-22-1996     BSA:          1.648 m Patient Age:    24 years       BP:           100/76 mmHg Patient Gender: F              HR:           145 bpm. Exam Location:  Inpatient Procedure: 2D Echo, Cardiac Doppler and Color Doppler Indications:    Atrial fibrillation  History:        Patient has prior history of Echocardiogram examinations, most  recent 09/23/2017. Arrythmias:Atrial Fibrillation.  Sonographer:    Neomia DearAMARA CROWN RDCS Referring Phys: [id number] Deno LungerGEORGE J [facility] DetroitHALHOUB  Sonographer Comments: No cardiac surgery noted in chart IMPRESSIONS  1. Difficult to assess LV function as in Afib with RVR to 140s during study. Left ventricular ejection fraction, by estimation, is 40 to 45%. The left ventricle has mildly decreased function. The left ventricle demonstrates global hypokinesis. There is mild left ventricular hypertrophy. Left ventricular diastolic parameters are indeterminate.  2. Right ventricular systolic function is normal. The right ventricular size is normal. There is normal pulmonary artery systolic  pressure. The estimated right ventricular systolic pressure is 29.0 mmHg.  3. The mitral valve is normal in structure. Mild mitral valve regurgitation.  4. The aortic valve is tricuspid. Aortic valve regurgitation is not visualized. No aortic stenosis is present.  5. The inferior vena cava is dilated in size with <50% respiratory variability, suggesting right atrial pressure of 15 mmHg. FINDINGS  Left Ventricle: Left ventricular ejection fraction, by estimation, is 40 to 45%. The left ventricle has mildly decreased function. The left ventricle demonstrates global hypokinesis. The left ventricular internal cavity size was normal in size. There is  mild left ventricular hypertrophy. Left ventricular diastolic parameters are indeterminate. Right Ventricle: The right ventricular size is normal. No increase in right ventricular wall thickness. Right ventricular systolic function is normal. There is normal pulmonary artery systolic pressure. The tricuspid regurgitant velocity is 1.87 m/s, and  with an assumed right atrial pressure of 15 mmHg, the estimated right ventricular systolic pressure is 29.0 mmHg. Left Atrium: Left atrial size was normal in size. Right Atrium: Right atrial size was normal in size. Pericardium: There is no evidence of pericardial effusion. Mitral Valve: The mitral valve is normal in structure. Mild mitral valve regurgitation. Tricuspid Valve: The tricuspid valve is normal in structure. Tricuspid valve regurgitation is mild. Aortic Valve: The aortic valve is tricuspid. Aortic valve regurgitation is not visualized. No aortic stenosis is present. Aortic valve mean gradient measures 3.0 mmHg. Aortic valve peak gradient measures 6.0 mmHg. Aortic valve area, by VTI measures 3.07 cm. Pulmonic Valve: The pulmonic valve was not well visualized. Pulmonic valve regurgitation is not visualized. Aorta: The aortic root is normal in size and structure. Venous: The inferior vena cava is dilated in size with less  than 50% respiratory variability, suggesting right atrial pressure of 15 mmHg. IAS/Shunts: The interatrial septum was not well visualized.  LEFT VENTRICLE PLAX 2D LVIDd:         4.30 cm LVIDs:         3.80 cm LV PW:         1.00 cm LV IVS:        1.10 cm LVOT diam:     2.00 cm LV SV:         52 LV SV Index:   31 LVOT Area:     3.14 cm  LV Volumes (MOD) LV vol d, MOD A2C: 48.4 ml LV vol d, MOD A4C: 50.5 ml LV vol s, MOD A2C: 33.0 ml LV vol s, MOD A4C: 26.6 ml LV SV MOD A2C:     15.4 ml LV SV MOD A4C:     50.5 ml LV SV MOD BP:      17.7 ml RIGHT VENTRICLE RV S prime:     10.95 cm/s RVOT diam:      3.00 cm LEFT ATRIUM             Index  RIGHT ATRIUM           Index LA diam:        3.50 cm 2.12 cm/m  RA Area:     12.10 cm LA Vol (A2C):   45.1 ml 27.35 ml/m RA Volume:   28.80 ml  17.48 ml/m LA Vol (A4C):   32.0 ml 19.42 ml/m LA Biplane Vol: 31.2 ml 18.94 ml/m  AORTIC VALVE AV Area (Vmax):    2.63 cm AV Area (Vmean):   2.65 cm AV Area (VTI):     3.07 cm AV Vmax:           122.00 cm/s PULMONARY ARTERY AV Vmean:          78.600 cm/s MPA diam:        2.60 cm AV VTI:            0.169 m AV Peak Grad:      6.0 mmHg AV Mean Grad:      3.0 mmHg LVOT Vmax:         102.00 cm/s LVOT Vmean:        66.300 cm/s LVOT VTI:          0.165 m LVOT/AV VTI ratio: 0.98  AORTA Ao Root diam: 2.40 cm Ao Asc diam:  2.10 cm MITRAL VALVE               TRICUSPID VALVE MV Area (PHT): 13.55 cm   TR Peak grad:   14.0 mmHg MV Decel Time: 56 msec     TR Vmax:        187.00 cm/s MV E velocity: 87.80 cm/s                            SHUNTS                            Systemic VTI:  0.16 m                            Systemic Diam: 2.00 cm                            Pulmonic Diam: 3.00 cm Epifanio Lesches MD Electronically signed by Epifanio Lesches MD Signature Date/Time: 03/05/2021/1:21:38 PM    Final       DVT prophylaxis: TED hose  Code Status: Full code  Family Communication: No family at  bedside   Consultants:    Procedures:      Objective    Physical Examination:   General-appears in no acute distress  Heart-S1-S2, irregular, no murmur auscultated  Lungs-clear to auscultation bilaterally, no wheezing or crackles auscultated  Abdomen-soft, nontender, no organomegaly  Extremities-no edema in the lower extremities  Neuro-alert, oriented x3, no focal deficit noted   Status is: Inpatient  Dispo: The patient is from: Home              Anticipated d/c is to: Home              Anticipated d/c date is: 03/08/2021              Patient currently not stable for discharge  Barrier to discharge-patient on IV antibiotics for pyelonephritis, A. fib with RVR  COVID-19 Labs  Recent Labs    03/05/21 0032  DDIMER 2.04*  Lab Results  Component Value Date   SARSCOV2NAA NEGATIVE 03/03/2021   SARSCOV2NAA NEGATIVE 01/16/2021    Microbiology  Recent Results (from the past 240 hour(s))  Resp Panel by RT-PCR (Flu A&B, Covid) Nasopharyngeal Swab     Status: None   Collection Time: 03/03/21  9:54 PM   Specimen: Nasopharyngeal Swab; Nasopharyngeal(NP) swabs in vial transport medium  Result Value Ref Range Status   SARS Coronavirus 2 by RT PCR NEGATIVE NEGATIVE Final    Comment: (NOTE) SARS-CoV-2 target nucleic acids are NOT DETECTED.  The SARS-CoV-2 RNA is generally detectable in upper respiratory specimens during the acute phase of infection. The lowest concentration of SARS-CoV-2 viral copies this assay can detect is 138 copies/mL. A negative result does not preclude SARS-Cov-2 infection and should not be used as the sole basis for treatment or other patient management decisions. A negative result may occur with  improper specimen collection/handling, submission of specimen other than nasopharyngeal swab, presence of viral mutation(s) within the areas targeted by this assay, and inadequate number of viral copies(<138 copies/mL). A negative result must  be combined with clinical observations, patient history, and epidemiological information. The expected result is Negative.  Fact Sheet for Patients:  BloggerCourse.com  Fact Sheet for Healthcare Providers:  SeriousBroker.it  This test is no t yet approved or cleared by the [place] FDA and  has been authorized for detection and/or diagnosis of SARS-CoV-2 by FDA under an Emergency Use Authorization (EUA). This EUA will remain  in effect (meaning this test can be used) for the duration of the COVID-19 declaration under Section 564(b)(1) of the Act, 21 U.S.C.section 360bbb-3(b)(1), unless the authorization is terminated  or revoked sooner.       Influenza A by PCR NEGATIVE NEGATIVE Final   Influenza B by PCR NEGATIVE NEGATIVE Final    Comment: (NOTE) The Xpert Xpress SARS-CoV-2/FLU/RSV plus assay is intended as an aid in the diagnosis of influenza from Nasopharyngeal swab specimens and should not be used as a sole basis for treatment. Nasal washings and aspirates are unacceptable for Xpert Xpress SARS-CoV-2/FLU/RSV testing.  Fact Sheet for Patients: BloggerCourse.com  Fact Sheet for Healthcare Providers: SeriousBroker.it  This test is not yet approved or cleared by the [place] FDA and has been authorized for detection and/or diagnosis of SARS-CoV-2 by FDA under an Emergency Use Authorization (EUA). This EUA will remain in effect (meaning this test can be used) for the duration of the COVID-19 declaration under Section 564(b)(1) of the Act, 21 U.S.C. section 360bbb-3(b)(1), unless the authorization is terminated or revoked.  Performed at [facility], [address], [place], [place] [address]   Urine culture     Status: Abnormal   Collection Time: 03/04/21  1:39 AM   Specimen: Urine, Random  Result Value Ref Range Status   Specimen Description URINE, RANDOM   Final   Special Requests   Final    NONE Performed at [facility], [address], [place], [place] [address]    Culture >=100,000 COLONIES/mL ESCHERICHIA COLI (A)  Final   Report Status 03/06/2021 FINAL  Final   Organism ID, Bacteria ESCHERICHIA COLI (A)  Final      Susceptibility   Escherichia coli - MIC*    AMPICILLIN >=32 RESISTANT Resistant     CEFAZOLIN 16 SENSITIVE Sensitive     CEFEPIME <=0.12 SENSITIVE Sensitive     CEFTRIAXONE <=0.25 SENSITIVE Sensitive     CIPROFLOXACIN <=0.25 SENSITIVE Sensitive     GENTAMICIN <=  1 SENSITIVE Sensitive     IMIPENEM <=0.25 SENSITIVE Sensitive     NITROFURANTOIN <=16 SENSITIVE Sensitive     TRIMETH/SULFA <=20 SENSITIVE Sensitive     AMPICILLIN/SULBACTAM >=32 RESISTANT Resistant     PIP/TAZO <=4 SENSITIVE Sensitive     * >=100,000 COLONIES/mL ESCHERICHIA COLI  Culture, blood (single)     Status: Abnormal   Collection Time: 03/04/21  2:57 AM   Specimen: BLOOD  Result Value Ref Range Status   Specimen Description BLOOD SITE NOT SPECIFIED  Final   Special Requests   Final    BOTTLES DRAWN AEROBIC AND ANAEROBIC Blood Culture results may not be optimal due to an excessive volume of blood received in culture bottles   Culture  Setup Time   Final    AEROBIC BOTTLE ONLY GRAM NEGATIVE RODS CRITICAL RESULT CALLED TO, READ BACK BY AND VERIFIED WITH: Melven Sartorius [facility] 03/05/21 0037 JDW Performed at [facility], [address] [place], [place] [address]    Culture ESCHERICHIA COLI (A)  Final   Report Status 03/06/2021 FINAL  Final   Organism ID, Bacteria ESCHERICHIA COLI  Final      Susceptibility   Escherichia coli - MIC*    AMPICILLIN >=32 RESISTANT Resistant     CEFAZOLIN <=4 SENSITIVE Sensitive     CEFEPIME <=0.12 SENSITIVE Sensitive     CEFTAZIDIME <=1 SENSITIVE Sensitive     CEFTRIAXONE <=0.25 SENSITIVE Sensitive     CIPROFLOXACIN <=0.25 SENSITIVE Sensitive     GENTAMICIN <=1 SENSITIVE Sensitive     IMIPENEM <=0.25  SENSITIVE Sensitive     TRIMETH/SULFA <=20 SENSITIVE Sensitive     AMPICILLIN/SULBACTAM >=32 RESISTANT Resistant     PIP/TAZO <=4 SENSITIVE Sensitive     * ESCHERICHIA COLI  Blood Culture ID Panel (Reflexed)     Status: Abnormal   Collection Time: 03/04/21  2:57 AM  Result Value Ref Range Status   Enterococcus faecalis NOT DETECTED NOT DETECTED Final   Enterococcus Faecium NOT DETECTED NOT DETECTED Final   Listeria monocytogenes NOT DETECTED NOT DETECTED Final   Staphylococcus species NOT DETECTED NOT DETECTED Final   Staphylococcus aureus (BCID) NOT DETECTED NOT DETECTED Final   Staphylococcus epidermidis NOT DETECTED NOT DETECTED Final   Staphylococcus lugdunensis NOT DETECTED NOT DETECTED Final   Streptococcus species NOT DETECTED NOT DETECTED Final   Streptococcus agalactiae NOT DETECTED NOT DETECTED Final   Streptococcus pneumoniae NOT DETECTED NOT DETECTED Final   Streptococcus pyogenes NOT DETECTED NOT DETECTED Final   A.calcoaceticus-baumannii NOT DETECTED NOT DETECTED Final   Bacteroides fragilis NOT DETECTED NOT DETECTED Final   Enterobacterales DETECTED (A) NOT DETECTED Final    Comment: Enterobacterales represent a large order of gram negative bacteria, not a single organism. CRITICAL RESULT CALLED TO, READ BACK BY AND VERIFIED WITH: J LEDFORD PHARMD 03/05/21 0037 JDW    Enterobacter cloacae complex NOT DETECTED NOT DETECTED Final   Escherichia coli DETECTED (A) NOT DETECTED Final    Comment: CRITICAL RESULT CALLED TO, READ BACK BY AND VERIFIED WITH: J LEDFORD PHARMD 03/05/21 0037 JDW    Klebsiella aerogenes NOT DETECTED NOT DETECTED Final   Klebsiella oxytoca NOT DETECTED NOT DETECTED Final   Klebsiella pneumoniae NOT DETECTED NOT DETECTED Final   Proteus species NOT DETECTED NOT DETECTED Final   Salmonella species NOT DETECTED NOT DETECTED Final   Serratia marcescens NOT DETECTED NOT DETECTED Final   Haemophilus influenzae NOT DETECTED NOT DETECTED Final   Neisseria  meningitidis NOT DETECTED NOT DETECTED  Final   Pseudomonas aeruginosa NOT DETECTED NOT DETECTED Final   Stenotrophomonas maltophilia NOT DETECTED NOT DETECTED Final   Candida albicans NOT DETECTED NOT DETECTED Final   Candida auris NOT DETECTED NOT DETECTED Final   Candida glabrata NOT DETECTED NOT DETECTED Final   Candida krusei NOT DETECTED NOT DETECTED Final   Candida parapsilosis NOT DETECTED NOT DETECTED Final   Candida tropicalis NOT DETECTED NOT DETECTED Final   Cryptococcus neoformans/gattii NOT DETECTED NOT DETECTED Final   CTX-M ESBL NOT DETECTED NOT DETECTED Final   Carbapenem resistance IMP NOT DETECTED NOT DETECTED Final   Carbapenem resistance KPC NOT DETECTED NOT DETECTED Final   Carbapenem resistance NDM NOT DETECTED NOT DETECTED Final   Carbapenem resist OXA 48 LIKE NOT DETECTED NOT DETECTED Final   Carbapenem resistance VIM NOT DETECTED NOT DETECTED Final    Comment: Performed at [facility], [address], [place], [place] [address]             Meredeth Ide   [facility] If 7PM-7AM, please contact night-coverage at [email or web address], Office  [phone]   03/06/2021, 4:31 PM  LOS: 2 days

## 2021-03-06 NOTE — Progress Notes (Addendum)
Progress Note  Patient Name: Laurie Richards Date of Encounter: 03/06/2021  [facility] Cardiologist: No primary care provider on file.   Subjective   Patient remains in afib with elevated rates up to 160s-170s, and 200s when she walks. She feels some palpitations and chest tightness. Breathing is stable.   Inpatient Medications    Scheduled Meds: . famotidine  20 mg Oral BID  . fluticasone  2 spray Each Nare Daily  . loratadine  10 mg Oral Daily   Continuous Infusions: . cefTRIAXone (ROCEPHIN)  IV 2 g (03/06/21 0920)  . lactated ringers 75 mL/hr at 03/06/21 1005   PRN Meds: acetaminophen **OR** acetaminophen, ALPRAZolam, alum & mag hydroxide-simeth, HYDROmorphone (DILAUDID) injection, lip balm, ondansetron **OR** ondansetron (ZOFRAN) IV, ondansetron (ZOFRAN) IV, traMADol, traMADol   Vital Signs    Vitals:   03/06/21 0414 03/06/21 0937 03/06/21 0941 03/06/21 1012  BP: (!) 125/94 (!) 147/109 111/68 120/64  Pulse: (!) 113 85 100 96  Resp: 19 17 13 17   Temp: 98.5 F (36.9 C) 98.3 F (36.8 C) 98.8 F (37.1 C) 98.8 F (37.1 C)  TempSrc: Oral Oral  Oral  SpO2: 100% 96% 96% 95%  Weight:      Height:        Intake/Output Summary (Last 24 hours) at 03/06/2021 1032 Last data filed at 03/06/2021 0936 Gross per 24 hour  Intake 968.67 ml  Output 3300 ml  Net -2331.33 ml   Last 3 Weights 03/04/2021 08/10/2018 05/06/2018  Weight (lbs) 133 lb 8 oz 116 lb 12 oz 115 lb  Weight (kg) 60.555 kg 52.957 kg 52.164 kg      Telemetry    Afib Hr labile up to 160-170s at time - Personally Reviewed  ECG    No new - Personally Reviewed  Physical Exam   GEN: No acute distress.   Neck: No JVD Cardiac: Reg reg, tachy, no murmurs, rubs, or gallops.  Respiratory: Clear to auscultation bilaterally. GI: Soft, nontender, non-distended  MS: No edema; No deformity. Neuro:  Nonfocal  Psych: Normal affect   Labs    High Sensitivity Troponin:   Recent Labs  Lab 03/03/21 2155  03/03/21 2346 03/05/21 0032 03/05/21 0542  TROPONINIHS 8 7 105* 90*      Chemistry Recent Labs  Lab 03/03/21 2155 03/04/21 2204 03/05/21 0032 03/06/21 0131  NA 133* 135 134* 137  K 3.2* 3.7 3.9 3.9  CL 100 107 109 106  CO2 21* 19* 19* 26  GLUCOSE 130* 170* 189* 117*  BUN 16 11 13 11   CREATININE 1.05* 0.85 0.78 0.73  CALCIUM 8.5* 7.9* 7.8* 7.9*  PROT 6.7  --   --   --   ALBUMIN 3.2*  --   --   --   AST 42*  --   --   --   ALT 45*  --   --   --   ALKPHOS 66  --   --   --   BILITOT 1.2  --   --   --   GFRNONAA >60 >60 >60 >60  ANIONGAP 12 9 6 5      Hematology Recent Labs  Lab 03/03/21 2155 03/05/21 0032  WBC 12.8* 11.6*  RBC 4.93 4.11  HGB 13.7 11.5*  HCT 41.0 34.4*  MCV 83.2 83.7  MCH 27.8 28.0  MCHC 33.4 33.4  RDW 14.5 14.9  PLT 185 134*    BNPNo results for input(s): BNP, PROBNP in the last 168 hours.  DDimer  Recent Labs  Lab 03/05/21 0032  DDIMER 2.04*     Radiology    CT ANGIO CHEST PE W OR WO CONTRAST  Result Date: 03/05/2021 CLINICAL DATA:  25 year old female presenting with right side pyelonephritis, possible superimposed distal obstructing ureteral stone. Status post retrograde pyelogram and right ureteral stent placement yesterday. EXAM: CT ANGIOGRAPHY CHEST WITH CONTRAST TECHNIQUE: Multidetector CT imaging of the chest was performed using the standard protocol during bolus administration of intravenous contrast. Multiplanar CT image reconstructions and MIPs were obtained to evaluate the vascular anatomy. CONTRAST:  70mL OMNIPAQUE IOHEXOL 300 MG/ML  SOLN COMPARISON:  CT Abdomen and Pelvis 03/04/2021. Chest radiographs 03/03/2021. FINDINGS: Cardiovascular: Excellent contrast bolus timing in the pulmonary arterial tree. No focal filling defect identified in the pulmonary arteries to suggest acute pulmonary embolism. No left heart or arterial contrast on these images. No cardiomegaly or pericardial effusion. Mediastinum/Nodes: Negative. Lungs/Pleura:  Small new layering pleural effusion since yesterday. Major airways are patent. Lower lung volumes with perihilar and dependent atelectasis. More confluent posterior basal segment lower lobe peribronchial opacity, and mild septal thickening in both lungs. No consolidation. Upper Abdomen: Negative visible noncontrast upper abdomen. Musculoskeletal: Negative. Review of the MIP images confirms the above findings. IMPRESSION: 1. Negative for acute pulmonary embolus. 2. Small new layering pleural effusions since yesterday. Lower lung volumes with atelectasis, and possible mild interstitial edema. No convincing pneumonia at this time. Electronically Signed   By: Odessa Fleming M.D.   On: 03/05/2021 04:23   DG Retrograde Pyelogram  Result Date: 03/05/2021 CLINICAL DATA:  Cystoscopy Right stent placement Right ureteral calculus EXAM: RETROGRADE PYELOGRAM COMPARISON:  CT abdomen pelvis 03/04/2021 FINDINGS: Five intraoperative images were submitted for interpretation. Multiple small filling defects seen in the distal ureter on the second and third images likely due to calculi. The last 2 images demonstrate the proximal and distal ends of the ureteral stent. IMPRESSION: Intraoperative fluoroscopic images of right retrograde pyelogram as above. Electronically Signed   By: Acquanetta Belling M.D.   On: 03/05/2021 08:01   ECHOCARDIOGRAM COMPLETE  Result Date: 03/05/2021    ECHOCARDIOGRAM REPORT   Patient Name:   Laurie Richards Date of Exam: 03/05/2021 Medical Rec #:  [id number]      Height:       64.0 in Accession #:    [id number]     Weight:       133.5 lb Date of Birth:  Sep 29, 1996     BSA:          1.648 m Patient Age:    24 years       BP:           100/76 mmHg Patient Gender: F              HR:           145 bpm. Exam Location:  Inpatient Procedure: 2D Echo, Cardiac Doppler and Color Doppler Indications:    Atrial fibrillation  History:        Patient has prior history of Echocardiogram examinations, most                 recent  09/23/2017. Arrythmias:Atrial Fibrillation.  Sonographer:    Neomia Dear RDCS Referring Phys: [id number] Deno Lunger [facility]  Sonographer Comments: No cardiac surgery noted in chart IMPRESSIONS  1. Difficult to assess LV function as in Afib with RVR to 140s during study. Left ventricular ejection fraction, by estimation, is 40 to 45%. The left ventricle has  mildly decreased function. The left ventricle demonstrates global hypokinesis. There is mild left ventricular hypertrophy. Left ventricular diastolic parameters are indeterminate.  2. Right ventricular systolic function is normal. The right ventricular size is normal. There is normal pulmonary artery systolic pressure. The estimated right ventricular systolic pressure is 29.0 mmHg.  3. The mitral valve is normal in structure. Mild mitral valve regurgitation.  4. The aortic valve is tricuspid. Aortic valve regurgitation is not visualized. No aortic stenosis is present.  5. The inferior vena cava is dilated in size with <50% respiratory variability, suggesting right atrial pressure of 15 mmHg. FINDINGS  Left Ventricle: Left ventricular ejection fraction, by estimation, is 40 to 45%. The left ventricle has mildly decreased function. The left ventricle demonstrates global hypokinesis. The left ventricular internal cavity size was normal in size. There is  mild left ventricular hypertrophy. Left ventricular diastolic parameters are indeterminate. Right Ventricle: The right ventricular size is normal. No increase in right ventricular wall thickness. Right ventricular systolic function is normal. There is normal pulmonary artery systolic pressure. The tricuspid regurgitant velocity is 1.87 m/s, and  with an assumed right atrial pressure of 15 mmHg, the estimated right ventricular systolic pressure is 29.0 mmHg. Left Atrium: Left atrial size was normal in size. Right Atrium: Right atrial size was normal in size. Pericardium: There is no evidence of pericardial effusion.  Mitral Valve: The mitral valve is normal in structure. Mild mitral valve regurgitation. Tricuspid Valve: The tricuspid valve is normal in structure. Tricuspid valve regurgitation is mild. Aortic Valve: The aortic valve is tricuspid. Aortic valve regurgitation is not visualized. No aortic stenosis is present. Aortic valve mean gradient measures 3.0 mmHg. Aortic valve peak gradient measures 6.0 mmHg. Aortic valve area, by VTI measures 3.07 cm. Pulmonic Valve: The pulmonic valve was not well visualized. Pulmonic valve regurgitation is not visualized. Aorta: The aortic root is normal in size and structure. Venous: The inferior vena cava is dilated in size with less than 50% respiratory variability, suggesting right atrial pressure of 15 mmHg. IAS/Shunts: The interatrial septum was not well visualized.  LEFT VENTRICLE PLAX 2D LVIDd:         4.30 cm LVIDs:         3.80 cm LV PW:         1.00 cm LV IVS:        1.10 cm LVOT diam:     2.00 cm LV SV:         52 LV SV Index:   31 LVOT Area:     3.14 cm  LV Volumes (MOD) LV vol d, MOD A2C: 48.4 ml LV vol d, MOD A4C: 50.5 ml LV vol s, MOD A2C: 33.0 ml LV vol s, MOD A4C: 26.6 ml LV SV MOD A2C:     15.4 ml LV SV MOD A4C:     50.5 ml LV SV MOD BP:      17.7 ml RIGHT VENTRICLE RV S prime:     10.95 cm/s RVOT diam:      3.00 cm LEFT ATRIUM             Index       RIGHT ATRIUM           Index LA diam:        3.50 cm 2.12 cm/m  RA Area:     12.10 cm LA Vol (A2C):   45.1 ml 27.35 ml/m RA Volume:   28.80 ml  17.48 ml/m LA Vol (A4C):  32.0 ml 19.42 ml/m LA Biplane Vol: 31.2 ml 18.94 ml/m  AORTIC VALVE AV Area (Vmax):    2.63 cm AV Area (Vmean):   2.65 cm AV Area (VTI):     3.07 cm AV Vmax:           122.00 cm/s PULMONARY ARTERY AV Vmean:          78.600 cm/s MPA diam:        2.60 cm AV VTI:            0.169 m AV Peak Grad:      6.0 mmHg AV Mean Grad:      3.0 mmHg LVOT Vmax:         102.00 cm/s LVOT Vmean:        66.300 cm/s LVOT VTI:          0.165 m LVOT/AV VTI ratio: 0.98   AORTA Ao Root diam: 2.40 cm Ao Asc diam:  2.10 cm MITRAL VALVE               TRICUSPID VALVE MV Area (PHT): 13.55 cm   TR Peak grad:   14.0 mmHg MV Decel Time: 56 msec     TR Vmax:        187.00 cm/s MV E velocity: 87.80 cm/s                            SHUNTS                            Systemic VTI:  0.16 m                            Systemic Diam: 2.00 cm                            Pulmonic Diam: 3.00 cm Epifanio Lesches MD Electronically signed by Epifanio Lesches MD Signature Date/Time: 03/05/2021/1:21:38 PM    Final     Cardiac Studies   Echo 03/05/21 1. Difficult to assess LV function as in Afib with RVR to 140s during  study. Left ventricular ejection fraction, by estimation, is 40 to 45%.  The left ventricle has mildly decreased function. The left ventricle  demonstrates global hypokinesis. There is  mild left ventricular hypertrophy. Left ventricular diastolic parameters  are indeterminate.  2. Right ventricular systolic function is normal. The right ventricular  size is normal. There is normal pulmonary artery systolic pressure. The  estimated right ventricular systolic pressure is 29.0 mmHg.  3. The mitral valve is normal in structure. Mild mitral valve  regurgitation.  4. The aortic valve is tricuspid. Aortic valve regurgitation is not  visualized. No aortic stenosis is present.  5. The inferior vena cava is dilated in size with <50% respiratory  variability, suggesting right atrial pressure of 15 mmHg.   Patient Profile     25 y.o. female pmh of PAD not on AC (dx in 2018), hyperthy  Assessment & Plan    Paroxysmal Afib - diagnosed back in 2018 in the setting of UTI and was treated with Toprol and dilt and converted to SR. She was not started on a/c due to [facility] of 1 (female). She was lost to follow-up - Presented with acute pyelonephritis found tohave renal stone s/p stenting - On admission found to be  in Afib RVR. HS trop 105>90, likely demand ischemia -  Started on IV digoxin loaded with 0.5mg  followed by 0.25mg  Q6H with some heart rate improvement - Echo this admission showed minimally reduced EF 40-45%, global  Hypokinesis, mild MR - BP improving, consider starting rate control medication. - CHADSVASC of 1 , would not qualify for a/c but if needs TEE/DVV would need a/c. Can potentially schedule for TEE/DCCV Monday.  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 Also IVDU - Reviewed by MD and felt EF might be normal, difficult to evaluate given rapid EF. Repeat echo ones rates are better  Acute obstructing pyelonephritis with right distal ureteral stone - presented with abdominal pain, N/V found to have possible obstructive stone - Urology consulted and patient underwent cystoscopy and stenting performed 03/03/21 - per Urology/IM  Hypokalemia - daily BMET  For questions or updates, please contact [facility] Please consult [email or web address] for contact info under        Signed, Cadence David Stall, PA-C  03/06/2021, 10:32 AM    Personally seen and examined. Agree with above.  Challenging control heart rate with atrial fibrillation.  Approaches 200 when walking.  Received 4 total IV doses of digoxin 0.25 mg for load.  We will place on 0.125 mg p.o. dose.  Blood pressures have been low at times.  Metoprolol has been utilized but to no effect.  Unable to use diltiazem due to low blood pressure at this point. -We will call EP for other suggestions ideas.  Donato Schultz, MD

## 2021-03-06 NOTE — Consult Note (Addendum)
Cardiology Consultation:   Patient ID: Laurie Richards MRN: [id number]; DOB: Jun 22, 1996  Admit date: 03/03/2021 Date of Consult: 03/06/2021  PCP:  Patient, No Pcp Per (Inactive)   [facility]  Cardiologist:  Dr. Mariah MillingGollan [phone]}    Patient Profile:   Laurie Richards is a 25 y.o. 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 Anne FuSkains.  History of Present Illness:   Laurie Richards was last seen by cardiology in 09/2017 when she was seen in hospital consultation for the evaluation of palpitations found to have atrial fibrillation. She was treated with IV digoxin and oral diltiazem. She subsequently converted to NSR. PAF was felt to be instigated by a tooth abscess and URI. On cardiology sign off, it was recommended that she transition from oral diltiazem which caused hypotension to Toprol 12-25mg  QD.  Echocardiogram was performed 09/23/2017 and showed an LVEF at 60-65% with no RWMA, normal LA and no valvular disease.    [facility] with a 3 day hx of abdominal pain, nausea and vomiting along with palpitations. She states that the pain progressively worsened and she noted that her HR's were elevated in the 130 range per her Apple watch. Given her ongoing symptoms, she [facility]. Abdominal imaging showed  right-sided pyelonephritis.  There is also a questionable 4 mm stone likely to be an obstructing stone in the distal ureter versus a phlebolith per radiology.  Urology was consulted by the ER physician was less consistent with a phlebolith and requested a stat KUB to evaluate if contrast is in the right collecting system. The patient underwent cystoscopy and insertion of right ureteral stent patient per Urology Shew was admitted with sepsis due to UTI with  right-sided pyelonephritis  She was tachycardic, 130-150 range with bursts to 180s by notes, treated with IV metoprolol, abn dimer >> neg CT for PE, HS Trop mildly abnormal 105 felt demand 2/2 RVR She was loaded with IV dig 0.5mg  followed by 0.25mg  Q6H with concerns of soft BPs and cardiology consulted and dig continued, suspected that as her infection resolved her Afib would follow  labs Tox screen negative K+ 3.9 BUN/Creat 11/0.73 HS Trop 105 > 90 WBC 11.6 H/H 11/34 Plts 134  preg negative resp panel negative  TTE with LVEF 40-45% (in rapid Afib)  Rates have had minimal if any improvement.  BPs generally 100's/60's, some higher a few SBPs 90's today Yesterday 80's She is afebrile  Dig was transitioned to  PO She has gotten some sporadic doses of IV lopressor 2.5mg , none today She is getting pain management  EP is asked to help with rate control   She is just back form the bathroom, no cardiac awareness or SOB. HR 150's though quickly down to 130's She has burning with urination ad remains with some discomfort and a "pulling" sensation and slightly tender at her RLQ   Past Medical History:  Diagnosis Date   Asthma    Irregular heartbeat    a. occasional skipped beats since age 25.   Marijuana abuse    a. smokes 1-2 x/wk.   PAF (paroxysmal atrial fibrillation) (HCC)    a. Dx 09/2017 in setting of mild hyperthyroidism.   Tobacco abuse    a. smoking cigarettes since  age 13.   Tooth abscess 09/2017    Past Surgical History:  Procedure Laterality Date   APPENDECTOMY     CYSTOSCOPY WITH URETEROSCOPY AND STENT PLACEMENT Right 03/04/2021   Procedure: CYSTOSCOPY, RETROGRADE PYELOGRAM, RIGHT STENT PLACEMENT;  Surgeon: Sebastian Ache, MD;  Location: [facility] OR;  Service: Urology;  Laterality: Right;   MOUTH SURGERY       Home Medications:  Prior to Admission medications   Medication Sig Start Date End Date Taking? Authorizing Provider  cetirizine (ZYRTEC ALLERGY) 10 MG tablet  Take 1 tablet (10 mg total) by mouth daily. Patient taking differently: Take 10 mg by mouth at bedtime. 01/16/21  Yes Wallis Bamberg, PA-C  ibuprofen (ADVIL) 600 MG tablet Take 600 mg by mouth every 6 (six) hours as needed (for pain).   Yes [provider]  naproxen sodium (ALEVE) 220 MG tablet Take 220-440 mg by mouth 2 (two) times daily as needed (for pain).   Yes [provider]  acetaminophen (TYLENOL) 325 MG tablet Take 650 mg by mouth every 6 (six) hours as needed.    [provider]  amoxicillin-clavulanate (AUGMENTIN) 875-125 MG tablet Take 1 tablet by mouth every 12 (twelve) hours. Patient not taking: No sig reported 12/23/20   Wallis Bamberg, PA-C  meloxicam (MOBIC) 15 MG tablet Take 1 tablet (15 mg total) by mouth daily. Patient not taking: No sig reported 12/23/20   Wallis Bamberg, PA-C  naproxen (NAPROSYN) 375 MG tablet Take 1 tablet (375 mg total) by mouth 2 (two) times daily with a meal. Patient not taking: Reported on 03/04/2021 01/16/21   Wallis Bamberg, PA-C  pseudoephedrine (SUDAFED) 60 MG tablet Take 1 tablet (60 mg total) by mouth every 8 (eight) hours as needed for congestion. Patient not taking: Reported on 03/04/2021 01/16/21   Wallis Bamberg, PA-C  diltiazem (CARDIZEM) 30 MG tablet Take 1 tablet (30 mg total) as needed by mouth. If HR more than 100 09/23/17 12/23/20  Katha Hamming, MD  metoprolol succinate (TOPROL-XL) 25 MG 24 hr tablet Take 0.5 tablets (12.5 mg total) daily by mouth. 09/24/17 12/23/20  Katha Hamming, MD    Inpatient Medications: Scheduled Meds:  digoxin  0.125 mg Oral Daily   famotidine  20 mg Oral BID   fluticasone  2 spray Each Nare Daily   loratadine  10 mg Oral Daily   Continuous Infusions:  cefTRIAXone (ROCEPHIN)  IV 2 g (03/06/21 0920)   lactated ringers 75 mL/hr at 03/06/21 1005   PRN Meds: acetaminophen **OR** acetaminophen, ALPRAZolam, alum & mag hydroxide-simeth, HYDROmorphone (DILAUDID) injection, lip balm,  ondansetron **OR** ondansetron (ZOFRAN) IV, ondansetron (ZOFRAN) IV, traMADol, traMADol  Allergies:    Allergies  Allergen Reactions   Acetaminophen-Codeine Hives, Itching, Nausea And Vomiting, Swelling, Rash and Other (See Comments)    Mother had an anaphylactic reaction (patient stated)- And, vomiting if taken on empty stomach   Codeine Hives, Itching, Nausea And Vomiting, Swelling, Rash and Other (See Comments)    Mother had an anaphylactic reaction (patient stated)   Latex Rash and Other (See Comments)    Soreness, also    Social History:   Social History   Socioeconomic History   Marital status: Single    Spouse name: Not on file   Number of children: Not on file   Years of education: Not on file   Highest education level: Not on file  Occupational History   Not on file  Tobacco Use   Smoking status: Former Smoker    Packs/day:  0.50    Types: Cigarettes   Smokeless tobacco: Never Used  Vaping Use   Vaping Use: Never used  Substance and Sexual Activity   Alcohol use: Not Currently   Drug use: Not Currently   Sexual activity: Yes    Birth control/protection: None  Other Topics Concern   Not on file  Social History Narrative   Lives locally with fiance.  Does not routinely exercise.   Social Determinants of Health   Financial Resource Strain: Not on file  Food Insecurity: Not on file  Transportation Needs: Not on file  Physical Activity: Not on file  Stress: Not on file  Social Connections: Not on file  Intimate Partner Violence: Not on file    Family History:   Family History  Problem Relation Age of Onset   Heart failure Mother    Coronary artery disease Mother        Status post MI and stenting in her 30s   Diabetes Other    Coronary artery disease Maternal Uncle        Status post stenting at a young age   Coronary artery disease Maternal Uncle        Status post stenting at a young age     ROS:  Please see the history of present illness.  All  other ROS reviewed and negative.     Physical Exam/Data:   Vitals:   03/06/21 0941 03/06/21 1012 03/06/21 1314 03/06/21 1316  BP: 111/68 120/64  (!) 98/53  Pulse: 100 96 (!) 127 (!) 101  Resp: 13 17  20   Temp: 98.8 F (37.1 C) 98.8 F (37.1 C)  98.8 F (37.1 C)  TempSrc:  Oral  Oral  SpO2: 96% 95%  98%  Weight:      Height:        Intake/Output Summary (Last 24 hours) at 03/06/2021 1442 Last data filed at 03/06/2021 1323 Gross per 24 hour  Intake 50 ml  Output 3850 ml  Net -3800 ml   Last 3 Weights 03/04/2021 08/10/2018 05/06/2018  Weight (lbs) 133 lb 8 oz 116 lb 12 oz 115 lb  Weight (kg) 60.555 kg 52.957 kg 52.164 kg     Body mass index is 22.92 kg/m.  General:  Well nourished, well developed, in no acute distress HEENT: normal Lymph: no adenopathy Neck: no JVD Endocrine:  No thryomegaly Vascular: No carotid bruit  Cardiac:  irreg-irreg, tachycardic; no murmurs, gallops or rubs Lungs:  CTA b/l, no wheezing, rhonchi or rales  Abd: soft, slightly tender to light palpation RLQ, is soft,  no gaurding  Ext: no edema Musculoskeletal:  No deformities Skin: warm and dry  Neuro:  no focal abnormalities noted Psych:  Normal affect   EKG:  The EKG was personally reviewed and demonstrates:   ST 148bpm on admission AFib 189bpm  Telemetry:  Telemetry was personally reviewed and demonstrates:   Afib 110's-160's  Relevant CV Studies:  03/05/2021; TTE IMPRESSIONS   1. Difficult to assess LV function as in Afib with RVR to 140s during  study. Left ventricular ejection fraction, by estimation, is 40 to 45%.  The left ventricle has mildly decreased function. The left ventricle  demonstrates global hypokinesis. There is  mild left ventricular hypertrophy. Left ventricular diastolic parameters  are indeterminate.   2. Right ventricular systolic function is normal. The right ventricular  size is normal. There is normal pulmonary artery systolic pressure. The  estimated right  ventricular systolic pressure is 29.0 mmHg.  3. The mitral valve is normal in structure. Mild mitral valve  regurgitation.   4. The aortic valve is tricuspid. Aortic valve regurgitation is not  visualized. No aortic stenosis is present.   5. The inferior vena cava is dilated in size with <50% respiratory  variability, suggesting right atrial pressure of 15 mmHg.    Echocardiogram 09/23/2017: - Left ventricle: The cavity size was normal. Systolic function was    normal. The estimated ejection fraction was in the range of 60%    to 65%. Wall motion was normal; there were no regional wall    motion abnormalities. The study is not technically sufficient to    allow evaluation of LV diastolic function.  - Left atrium: The atrium was normal in size.  - Right ventricle: Systolic function was normal.  - Pulmonary arteries: Systolic pressure was within the normal    range.   Laboratory Data:  High Sensitivity Troponin:   Recent Labs  Lab 03/03/21 2155 03/03/21 2346 03/05/21 0032 03/05/21 0542  TROPONINIHS 8 7 105* 90*     Chemistry Recent Labs  Lab 03/04/21 2204 03/05/21 0032 03/06/21 0131  NA 135 134* 137  K 3.7 3.9 3.9  CL 107 109 106  CO2 19* 19* 26  GLUCOSE 170* 189* 117*  BUN 11 13 11   CREATININE 0.85 0.78 0.73  CALCIUM 7.9* 7.8* 7.9*  GFRNONAA >60 >60 >60  ANIONGAP 9 6 5     Recent Labs  Lab 03/03/21 2155  PROT 6.7  ALBUMIN 3.2*  AST 42*  ALT 45*  ALKPHOS 66  BILITOT 1.2   Hematology Recent Labs  Lab 03/03/21 2155 03/05/21 0032  WBC 12.8* 11.6*  RBC 4.93 4.11  HGB 13.7 11.5*  HCT 41.0 34.4*  MCV 83.2 83.7  MCH 27.8 28.0  MCHC 33.4 33.4  RDW 14.5 14.9  PLT 185 134*   BNPNo results for input(s): BNP, PROBNP in the last 168 hours.  DDimer  Recent Labs  Lab 03/05/21 0032  DDIMER 2.04*     Radiology/Studies:   CT ANGIO CHEST PE W OR WO CONTRAST Result Date: 03/05/2021 CLINICAL DATA:  25 year old female presenting with right side  pyelonephritis, possible superimposed distal obstructing ureteral stone. Status post retrograde pyelogram and right ureteral stent placement yesterday. EXAM: CT ANGIOGRAPHY CHEST WITH CONTRAST TECHNIQUE: Multidetector CT imaging of the chest was performed using the standard protocol during bolus administration of intravenous contrast. Multiplanar CT image reconstructions and MIPs were obtained to evaluate the vascular anatomy. CONTRAST:  61mL OMNIPAQUE IOHEXOL 300 MG/ML  SOLN COMPARISON:  CT Abdomen and Pelvis 03/04/2021. Chest radiographs 03/03/2021. FINDINGS: Cardiovascular: Excellent contrast bolus timing in the pulmonary arterial tree. No focal filling defect identified in the pulmonary arteries to suggest acute pulmonary embolism. No left heart or arterial contrast on these images. No cardiomegaly or pericardial effusion. Mediastinum/Nodes: Negative. Lungs/Pleura: Small new layering pleural effusion since yesterday. Major airways are patent. Lower lung volumes with perihilar and dependent atelectasis. More confluent posterior basal segment lower lobe peribronchial opacity, and mild septal thickening in both lungs. No consolidation. Upper Abdomen: Negative visible noncontrast upper abdomen. Musculoskeletal: Negative. Review of the MIP images confirms the above findings. IMPRESSION: 1. Negative for acute pulmonary embolus. 2. Small new layering pleural effusions since yesterday. Lower lung volumes with atelectasis, and possible mild interstitial edema. No convincing pneumonia at this time. Electronically Signed   By: Odessa Fleming M.D.   On: 03/05/2021 04:23     CT ABDOMEN PELVIS W CONTRAST Result Date:  03/04/2021 CLINICAL DATA:  25 year old female with tachycardia for 2 days. Lightheaded, dizziness. Nausea vomiting. EXAM: CT ABDOMEN AND PELVIS WITH CONTRAST TECHNIQUE: Multidetector CT imaging of the abdomen and pelvis was performed using the standard protocol following bolus administration of intravenous  contrast. CONTRAST:  80mL OMNIPAQUE IOHEXOL 300 MG/ML  SOLN COMPARISON:  CT Abdomen and Pelvis 07/26/2017. FINDINGS: Lower chest: Minor atelectasis at the right lung base. Otherwise negative. No cardiomegaly or pericardial effusion. Hepatobiliary: Negative liver and gallbladder. Pancreas: Negative. Spleen: Negative. Adrenals/Urinary Tract: Normal adrenal glands. The left kidney appears stable since 2018 and normal. The proximal left ureter is within normal limits. The right kidney is abnormal. The right kidney is heterogeneously enhancing and inflamed, with confluent areas of cortical hypoenhancement in the mid pole and lower pole (coronal image 45). Associated soft tissue stranding in the right pararenal space. There is superimposed right renal collecting system and right ureter urothelial thickening and enhancement. There is right periureteral stranding proximally. The distal ureter is difficult to delineate. And there is a new 4 mm calculus in the right hemipelvis since 2018 (series 3, image 75 and coronal image 38. However, there is not significant right hydronephrosis. Otherwise unremarkable bladder. And a new midline pelvic phlebolith is noted on series 3, image 78. Stomach/Bowel: Nondilated large and small bowel. Redundant transverse colon. Negative terminal ileum. Diminutive or absent appendix with no inflammation at the tip of the cecum. Stomach and duodenum are within normal limits. No free air, free fluid. Vascular/Lymphatic: Major arterial structures are patent and appear normal. The central venous structures of the abdomen, pelvis, and portal venous system also appear patent. No lymphadenopathy. Reproductive: Negative. Other: No pelvic free fluid. Musculoskeletal: Negative. IMPRESSION: 1. Positive for Acute Right Pyelonephritis. Questionable superimposed component of obstructive uropathy, but a new 4 mm calculus in the right hemipelvis is favored to be a phlebolith rather than a distal ureteral stone  given the absence of overt right hydronephrosis. No pararenal abscess. 2. Otherwise negative CT Abdomen and Pelvis. Electronically Signed   By: Odessa Fleming M.D.   On: 03/04/2021 04:28        Assessment and Plan:   1. Afib     in setting of UTI/pyelonephritis     She reports this goes back many years, into her childhood,though unaware of specifics.     She is largely unaware of her palpitations currently, says especially after getting xanax she feels much better  CHA2DS2Vasc is one for gender. I think her BP has room for lopressor, Faruq Rosenberger start 5mg  Q6 IV Follow  If we end up needing to pursue DCCVwill need to anticoagulate > TEE/DCCV   2. CM     Most likely 2/2 RVR, would suspect this to recover with HR and or rhythm control     Risk Assessment/Risk Scores:  { For questions or updates, please contact [facility] Please consult [email or web address] for contact info under    Signed, Sheilah Pigeon, PA-C  03/06/2021 2:42 PM  I have seen and examined this patient with Francis Dowse.  Agree with above, note added to reflect my findings.  On exam, tachycardic, irregular.  Patient presented to the hospital and was found to have pyelonephritis.  She is now status post ureteral stent.  She was also noted to be in rapid atrial fibrillation.  She has a history of atrial fibrillation and is not anticoagulated.  In prep for possible TEE and cardioversion, we Jamere Stidham start her on Eliquis today.  We Briscoe Daniello also start metoprolol  IV 5 mg every 6 hours.  If she tolerates this well, would switch to p.o. metoprolol 25 mg every 6 which can be titrated up to 50 mg every 6.  Edye Hainline M. Laurann Mcmorris MD 03/06/2021 4:44 PM

## 2021-03-07 ENCOUNTER — Other Ambulatory Visit (HOSPITAL_COMMUNITY): Payer: Self-pay

## 2021-03-07 DIAGNOSIS — E876 Hypokalemia: Secondary | ICD-10-CM

## 2021-03-07 DIAGNOSIS — N132 Hydronephrosis with renal and ureteral calculous obstruction: Secondary | ICD-10-CM

## 2021-03-07 LAB — COMPREHENSIVE METABOLIC PANEL
ALT: 24 U/L (ref 0–44)
AST: 22 U/L (ref 15–41)
Albumin: 2.2 g/dL — ABNORMAL LOW (ref 3.5–5.0)
Alkaline Phosphatase: 58 U/L (ref 38–126)
Anion gap: 8 (ref 5–15)
BUN: 5 mg/dL — ABNORMAL LOW (ref 6–20)
CO2: 27 mmol/L (ref 22–32)
Calcium: 8.1 mg/dL — ABNORMAL LOW (ref 8.9–10.3)
Chloride: 100 mmol/L (ref 98–111)
Creatinine, Ser: 0.71 mg/dL (ref 0.44–1.00)
GFR, Estimated: >60 mL/min (ref >60)
Glucose, Bld: 107 mg/dL — ABNORMAL HIGH (ref 70–99)
Potassium: 3.1 mmol/L — ABNORMAL LOW (ref 3.5–5.1)
Sodium: 135 mmol/L (ref 135–145)
Total Bilirubin: 0.7 mg/dL (ref 0.3–1.2)
Total Protein: 5.5 g/dL — ABNORMAL LOW (ref 6.5–8.1)

## 2021-03-07 LAB — CBC
HCT: 34.8 % — ABNORMAL LOW (ref 36.0–46.0)
Hemoglobin: 11.5 g/dL — ABNORMAL LOW (ref 12.0–15.0)
MCH: 27.5 pg (ref 26.0–34.0)
MCHC: 33 g/dL (ref 30.0–36.0)
MCV: 83.3 fL (ref 80.0–100.0)
Platelets: 181 10*3/uL (ref 150–400)
RBC: 4.18 MIL/uL (ref 3.87–5.11)
RDW: 14.6 % (ref 11.5–15.5)
WBC: 5.9 10*3/uL (ref 4.0–10.5)
nRBC: 0 % (ref 0.0–0.2)

## 2021-03-07 LAB — MAGNESIUM: Magnesium: 1.5 mg/dL — ABNORMAL LOW (ref 1.7–2.4)

## 2021-03-07 MED ORDER — POTASSIUM CHLORIDE CRYS ER 20 MEQ PO TBCR
40.0000 meq | EXTENDED_RELEASE_TABLET | Freq: Once | ORAL | Status: AC
  Administered 2021-03-07: 40 meq via ORAL
  Filled 2021-03-07: qty 2

## 2021-03-07 MED ORDER — POTASSIUM CHLORIDE CRYS ER 20 MEQ PO TBCR
40.0000 meq | EXTENDED_RELEASE_TABLET | ORAL | Status: AC
  Administered 2021-03-07 (×2): 40 meq via ORAL
  Filled 2021-03-07 (×2): qty 2

## 2021-03-07 MED ORDER — MAGNESIUM SULFATE 4 GM/100ML IV SOLN
4.0000 g | Freq: Once | INTRAVENOUS | Status: AC
  Administered 2021-03-07: 4 g via INTRAVENOUS
  Filled 2021-03-07: qty 100

## 2021-03-07 MED ORDER — CEFADROXIL 500 MG PO CAPS
1000.0000 mg | ORAL_CAPSULE | Freq: Two times a day (BID) | ORAL | 0 refills | Status: DC
  Filled 2021-03-07: qty 16, 4d supply, fill #0

## 2021-03-07 MED ORDER — CEPHALEXIN 500 MG PO CAPS
500.0000 mg | ORAL_CAPSULE | Freq: Four times a day (QID) | ORAL | Status: DC
  Administered 2021-03-07 – 2021-03-09 (×9): 500 mg via ORAL
  Filled 2021-03-07 (×9): qty 1

## 2021-03-07 NOTE — Progress Notes (Signed)
PROGRESS NOTE   Laurie Richards  OHY:[id number]    DOB: [birth year]    DOA: 03/03/2021  PCP: Laurie Richards, No Pcp Per (Inactive)   I have briefly reviewed patients previous medical records in [facility].  Chief Complaint  Laurie Richards presents with  . 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  Admitted for sepsis due to E. coli bacteremia with right-sided pyelonephritis complicating ureteral stone.  S/p cystoscopy with right ureteral stent by urology.  Hospital course complicated by A. fib with RVR, unable to control with meds alone, plan for possible TEE/DCCV on 5/2  Assessment & Plan:  Principal Problem:   Pyelonephritis Active Problems:   Tachycardia   Hypokalemia   Sepsis (HCC)   Ureteral stone with hydronephrosis   Paroxysmal A. fib, now with RVR: Diagnosed in 2018 in the setting of UTI, treated with Toprol and diltiazem and converted to SR.  She was not started on anticoagulation due to [facility] of 1 (female).  She was then lost to follow-up.  Now again with RVR.  Cardiology and EP cardiology consulted.  Loaded with IV digoxin and continuing digoxin 0.125 mg daily.  EP started her on IV metoprolol 5 mg q. 8 hourly.  Due to soft blood pressures and low EF, unable to use Cardizem.  Also due to soft blood pressures, unable to uptitrate beta-blockers.  TSH 0.736.  2D echo with LVEF 40-45% and global hypokinesis, likely rate related. CHA2DS2VASc score is 1 due to gender.  Laurie Richards started on Eliquis 5 mg twice daily for possible TEE/DCCV.  Cardiology plans TEE/DCCV on 5/2.  CTA chest 4/27: No PE.  Sepsis, POA due to E. coli  acute bacteremia secondary to acute pyelonephritis complicated by right ureteral stone: CT abdomen and pelvis 4/6: Acute right pyelonephritis, new 4 mm calculus in the right hemipelvis.  Urology was consulted and she underwent cystoscopy with right retrograde pyelogram and insertion of right ureteral stent for right ureteral stone with early pyelonephritis, on 4/27.  Treated with IV ceftriaxone 4/25 > 4/28.  Blood and urine culture from 4/25: E. coli, resistant to ampicillin, ampicillin/sulbactam but sensitive to ceftriaxone and others.  As per ID note in the chart, transitioned to Keflex 500 mg 4 times daily while here and on discharge could do cefadroxil twice daily for total 10 days treatment including days of ceftriaxone.  ID also recommends discussing with urology to see if stent to be removed within 1 to 2 weeks, normal prophylaxis Perry stent removal is adequate.  Cardiomyopathy 2D echo showed LVEF of 40- 45% with global hypokinesis.  Suspect rate related due to A. fib with RVR.  Needs repeat 2D echo in a couple of months after heart rates controlled.  Cardiology following  Hypokalemia/hypomagnesemia Replace aggressively and follow especially since on digoxin.  Acute anemia Etiology really not clear.  Could be related to acute illness/infectious etiology.  Hemoglobin stable in the 11.5 range for last 2 days.  Transient thrombocytopenia resolved.  Follow CBC.  Anxiety Received a dose of Xanax early on in admission.  Stable.  Body mass index is 22.92 kg/m.     DVT prophylaxis: Place TED hose Start: 03/04/21 0721     Code Status: Full  Code Family Communication: Discussed in detail with Laurie Richards's "sister/mom" that Laurie Richards refers to on Laurie Richards's speaker phone this morning, updated care and answered all questions. Disposition:  Status is: Inpatient  Remains inpatient appropriate because:Inpatient level of care appropriate due to severity of illness   Dispo:  Laurie Richards From:  Home  Planned Disposition: Home  Medically stable for discharge: No          Consultants:   Cardiology Urology ID  Procedures:   cystoscopy with right retrograde pyelogram and insertion of right ureteral stent for right ureteral stone with early pyelonephritis, on 4/27.  Antimicrobials:    Anti-infectives (From admission, onward)   Start     Dose/Rate Route Frequency Ordered Stop   03/07/21 1000  cephALEXin (KEFLEX) capsule 500 mg        500 mg Oral 4 times daily 03/07/21 0910     03/04/21 1000  cefTRIAXone (ROCEPHIN) 2 g in sodium chloride 0.9 % 100 mL IVPB  Status:  Discontinued        2 g 200 mL/hr over 30 Minutes Intravenous Every 24 hours 03/04/21 0709 03/07/21 0910   03/04/21 0730  cefTRIAXone (ROCEPHIN) 1 g in sodium chloride 0.9 % 100 mL IVPB  Status:  Discontinued        1 g 200 mL/hr over 30 Minutes Intravenous Every 24 hours 03/04/21 0720 03/04/21 0725   03/04/21 0215  cefTRIAXone (ROCEPHIN) 1 g in sodium chloride 0.9 % 100 mL IVPB        1 g 200 mL/hr over 30 Minutes Intravenous  Once 03/04/21 0206 03/04/21 0301        Subjective:  Laurie Richards seen this morning.  Reported ongoing urinary urgency and dysuria but better compared to admission.  No hematuria reported.  Occasional mild right flank pain.  Denies palpitation.  States that she is physically quite active at home with her nephews and nieces who are athletes, does not usually feel palpitations unless her heart rate goes up to "160-170".  No chest pain.  No dizziness or lightheadedness.  Objective:   Vitals:   03/07/21 0812 03/07/21 0915 03/07/21 1015 03/07/21 1202  BP:    96/65  Pulse:    (!) 106  Resp:    (!) 21  Temp: 99.8 F (37.7 C) 100 F (37.8 C) 99.8 F (37.7 C) 99 F (37.2 C)  TempSrc: Axillary Oral Oral   SpO2:    95%  Weight:      Height:        General exam: Pleasant young female, moderately built and thinly nourished, lying comfortably propped up in bed without distress. Respiratory  system: Clear to auscultation. Respiratory effort normal. Cardiovascular system: S1 & S2 heard, irregularly irregular and tachycardic. No JVD, murmurs, rubs, gallops or clicks.  Trace bilateral ankle edema.  Telemetry personally reviewed: At time of my visit, noted on telemetry with A. fib with RVR up to 150s.  Ventricular rates fluctuating between 100- 150s. Gastrointestinal system: Abdomen is nondistended, soft and nontender. No organomegaly or masses felt. Normal bowel sounds heard. Central nervous system: Alert and oriented. No focal neurological deficits. Extremities: Symmetric 5 x 5 power. Skin: No rashes, lesions or ulcers Psychiatry: Judgement and insight appear normal. Mood & affect appropriate.     Data Reviewed:   I have personally reviewed following labs and imaging studies   CBC: Recent Labs  Lab 03/03/21 2155 03/05/21 0032 03/07/21 0213  WBC 12.8* 11.6* 5.9  HGB 13.7 11.5* 11.5*  HCT 41.0 34.4*  34.8*  MCV 83.2 83.7 83.3  PLT 185 134* 181    Basic Metabolic Panel: Recent Labs  Lab 03/04/21 2204 03/05/21 0032 03/06/21 0131 03/07/21 0213 03/07/21 0214  NA 135 134* 137 135  --   K 3.7 3.9 3.9 3.1*  --   CL 107 109 106 100  --   CO2 19* 19* 26 27  --   GLUCOSE 170* 189* 117* 107*  --   BUN 11 13 11  5*  --   CREATININE 0.85 0.78 0.73 0.71  --   CALCIUM 7.9* 7.8* 7.9* 8.1*  --   MG 2.4 2.3  --   --  1.5*    Liver Function Tests: Recent Labs  Lab 03/03/21 2155 03/07/21 0213  AST 42* 22  ALT 45* 24  ALKPHOS 66 58  BILITOT 1.2 0.7  PROT 6.7 5.5*  ALBUMIN 3.2* 2.2*    CBG: Recent Labs  Lab 03/04/21 0539 03/04/21 2128  GLUCAP 84 173*    Microbiology Studies:   Recent Results (from the past 240 hour(s))  Resp Panel by RT-PCR (Flu A&B, Covid) Nasopharyngeal Swab     Status: None   Collection Time: 03/03/21  9:54 PM   Specimen: Nasopharyngeal Swab; Nasopharyngeal(NP) swabs in vial transport medium  Result Value Ref Range Status   SARS  Coronavirus 2 by RT PCR NEGATIVE NEGATIVE Final    Comment: (NOTE) SARS-CoV-2 target nucleic acids are NOT DETECTED.  The SARS-CoV-2 RNA is generally detectable in upper respiratory specimens during the acute phase of infection. The lowest concentration of SARS-CoV-2 viral copies this assay can detect is 138 copies/mL. A negative result does not preclude SARS-Cov-2 infection and should not be used as the sole basis for treatment or other Laurie Richards management decisions. A negative result may occur with  improper specimen collection/handling, submission of specimen other than nasopharyngeal swab, presence of viral mutation(s) within the areas targeted by this assay, and inadequate number of viral copies(<138 copies/mL). A negative result must be combined with clinical observations, Laurie Richards history, and epidemiological information. The expected result is Negative.  Fact Sheet for Patients:  03/05/21  Fact Sheet for Healthcare Providers:  BloggerCourse.com  This test is no t yet approved or cleared by the [employer] FDA and  has been authorized for detection and/or diagnosis of SARS-CoV-2 by FDA under an Emergency Use Authorization (EUA). This EUA will remain  in effect (meaning this test can be used) for the duration of the COVID-19 declaration under Section 564(b)(1) of the Act, 21 U.S.C.section 360bbb-3(b)(1), unless the authorization is terminated  or revoked sooner.       Influenza A by PCR NEGATIVE NEGATIVE Final   Influenza B by PCR NEGATIVE NEGATIVE Final    Comment: (NOTE) The Xpert Xpress SARS-CoV-2/FLU/RSV plus assay is intended as an aid in the diagnosis of influenza from Nasopharyngeal swab specimens and should not be used as a sole basis for treatment. Nasal washings and aspirates are unacceptable for Xpert Xpress SARS-CoV-2/FLU/RSV testing.  Fact Sheet for  Patients: [place]  Fact Sheet for Healthcare Providers: BloggerCourse.com  This test is not yet approved or cleared by the [employer] FDA and has been authorized for detection and/or diagnosis of SARS-CoV-2 by FDA under an Emergency Use Authorization (EUA). This EUA will remain in effect (meaning this test can be used) for the duration of the COVID-19 declaration under Section 564(b)(1) of the Act, 21 U.S.C. section 360bbb-3(b)(1), unless the authorization is terminated or revoked.  Performed at [facility]  Lab, [address] [place], [place] [address]   Urine culture     Status: Abnormal   Collection Time: 03/04/21  1:39 AM   Specimen: Urine, Random  Result Value Ref Range Status   Specimen Description URINE, RANDOM  Final   Special Requests   Final    NONE Performed at [facility], [address] [place], [place] [address]    Culture >=100,000 COLONIES/mL ESCHERICHIA COLI (A)  Final   Report Status 03/06/2021 FINAL  Final   Organism ID, Bacteria ESCHERICHIA COLI (A)  Final      Susceptibility   Escherichia coli - MIC*    AMPICILLIN >=32 RESISTANT Resistant     CEFAZOLIN 16 SENSITIVE Sensitive     CEFEPIME <=0.12 SENSITIVE Sensitive     CEFTRIAXONE <=0.25 SENSITIVE Sensitive     CIPROFLOXACIN <=0.25 SENSITIVE Sensitive     GENTAMICIN <=1 SENSITIVE Sensitive     IMIPENEM <=0.25 SENSITIVE Sensitive     NITROFURANTOIN <=16 SENSITIVE Sensitive     TRIMETH/SULFA <=20 SENSITIVE Sensitive     AMPICILLIN/SULBACTAM >=32 RESISTANT Resistant     PIP/TAZO <=4 SENSITIVE Sensitive     * >=100,000 COLONIES/mL ESCHERICHIA COLI  Culture, blood (single)     Status: Abnormal   Collection Time: 03/04/21  2:57 AM   Specimen: BLOOD  Result Value Ref Range Status   Specimen Description BLOOD SITE NOT SPECIFIED  Final   Special Requests   Final    BOTTLES DRAWN AEROBIC AND ANAEROBIC Blood Culture results may not be  optimal due to an excessive volume of blood received in culture bottles   Culture  Setup Time   Final    AEROBIC BOTTLE ONLY GRAM NEGATIVE RODS CRITICAL RESULT CALLED TO, READ BACK BY AND VERIFIED WITH: Melven SartoriusJ LEDFORD [facility] 03/05/21 0037 JDW Performed at [facility], [address] [place], [place] [address]    Culture ESCHERICHIA COLI (A)  Final   Report Status 03/06/2021 FINAL  Final   Organism ID, Bacteria ESCHERICHIA COLI  Final      Susceptibility   Escherichia coli - MIC*    AMPICILLIN >=32 RESISTANT Resistant     CEFAZOLIN <=4 SENSITIVE Sensitive     CEFEPIME <=0.12 SENSITIVE Sensitive     CEFTAZIDIME <=1 SENSITIVE Sensitive     CEFTRIAXONE <=0.25 SENSITIVE Sensitive     CIPROFLOXACIN <=0.25 SENSITIVE Sensitive     GENTAMICIN <=1 SENSITIVE Sensitive     IMIPENEM <=0.25 SENSITIVE Sensitive     TRIMETH/SULFA <=20 SENSITIVE Sensitive     AMPICILLIN/SULBACTAM >=32 RESISTANT Resistant     PIP/TAZO <=4 SENSITIVE Sensitive     * ESCHERICHIA COLI  Blood Culture ID Panel (Reflexed)     Status: Abnormal   Collection Time: 03/04/21  2:57 AM  Result Value Ref Range Status   Enterococcus faecalis NOT DETECTED NOT DETECTED Final   Enterococcus Faecium NOT DETECTED NOT DETECTED Final   Listeria monocytogenes NOT DETECTED NOT DETECTED Final   Staphylococcus species NOT DETECTED NOT DETECTED Final   Staphylococcus aureus (BCID) NOT DETECTED NOT DETECTED Final   Staphylococcus epidermidis NOT DETECTED NOT DETECTED Final   Staphylococcus lugdunensis NOT DETECTED NOT DETECTED Final   Streptococcus species NOT DETECTED NOT DETECTED Final   Streptococcus agalactiae NOT DETECTED NOT DETECTED Final   Streptococcus pneumoniae NOT DETECTED NOT DETECTED Final   Streptococcus pyogenes NOT DETECTED NOT DETECTED Final   A.calcoaceticus-baumannii NOT DETECTED NOT DETECTED Final   Bacteroides fragilis NOT DETECTED NOT DETECTED Final  Enterobacterales DETECTED (A) NOT DETECTED Final    Comment:  Enterobacterales represent a large order of gram negative bacteria, not a single organism. CRITICAL RESULT CALLED TO, READ BACK BY AND VERIFIED WITH: J LEDFORD PHARMD 03/05/21 0037 JDW    Enterobacter cloacae complex NOT DETECTED NOT DETECTED Final   Escherichia coli DETECTED (A) NOT DETECTED Final    Comment: CRITICAL RESULT CALLED TO, READ BACK BY AND VERIFIED WITH: J LEDFORD PHARMD 03/05/21 0037 JDW    Klebsiella aerogenes NOT DETECTED NOT DETECTED Final   Klebsiella oxytoca NOT DETECTED NOT DETECTED Final   Klebsiella pneumoniae NOT DETECTED NOT DETECTED Final   Proteus species NOT DETECTED NOT DETECTED Final   Salmonella species NOT DETECTED NOT DETECTED Final   Serratia marcescens NOT DETECTED NOT DETECTED Final   Haemophilus influenzae NOT DETECTED NOT DETECTED Final   Neisseria meningitidis NOT DETECTED NOT DETECTED Final   Pseudomonas aeruginosa NOT DETECTED NOT DETECTED Final   Stenotrophomonas maltophilia NOT DETECTED NOT DETECTED Final   Candida albicans NOT DETECTED NOT DETECTED Final   Candida auris NOT DETECTED NOT DETECTED Final   Candida glabrata NOT DETECTED NOT DETECTED Final   Candida krusei NOT DETECTED NOT DETECTED Final   Candida parapsilosis NOT DETECTED NOT DETECTED Final   Candida tropicalis NOT DETECTED NOT DETECTED Final   Cryptococcus neoformans/gattii NOT DETECTED NOT DETECTED Final   CTX-M ESBL NOT DETECTED NOT DETECTED Final   Carbapenem resistance IMP NOT DETECTED NOT DETECTED Final   Carbapenem resistance KPC NOT DETECTED NOT DETECTED Final   Carbapenem resistance NDM NOT DETECTED NOT DETECTED Final   Carbapenem resist OXA 48 LIKE NOT DETECTED NOT DETECTED Final   Carbapenem resistance VIM NOT DETECTED NOT DETECTED Final    Comment: Performed at [facility], [address], [place], [place] [address]     Radiology Studies:  No results found.   Scheduled Meds:   . apixaban  5 mg Oral BID  . cephALEXin  500 mg Oral QID  . digoxin  0.125  mg Oral Daily  . famotidine  20 mg Oral BID  . fluticasone  2 spray Each Nare Daily  . loratadine  10 mg Oral Daily  . metoprolol tartrate  5 mg Intravenous Q8H  . potassium chloride  40 mEq Oral Q4H    Continuous Infusions:   . lactated ringers 75 mL/hr at 03/07/21 1017     LOS: 3 days     Marcellus Scott, MD, [place], [facility]. [facility]    To contact the attending provider between 7A-7P or the covering provider during after hours 7P-7A, please log into the web site [email or web address] and access using universal [place] password for that web site. If you do not have the password, please call the hospital operator.  03/07/2021, 12:25 PM

## 2021-03-07 NOTE — Progress Notes (Signed)
ID/asp virtual note   25 yo female pAfib, hyperthyroid, prior ivdu admitted 4/25 with pyelo and ecoli bacteremia in setting right ureteral stone  Patient is s/p right ureteral stent  Doing very well now since HD #2 with resolved sepsis  ecoli sensitive to cefazolin   Vitals/labs reviewed  Imaging reviewed   A/p Pyelo ecoli bacteremia  Agree ecoli source likely pyelo No renal abscess  -agree can transition to PO cephalexin 500 mg qid here; on discharge, there is an oral formulation of cefodroxil bid dosing (will include our pharmacist to help) for total 10 days treatment including the days of ceftriaxone -please discuss with urology to see if stent to be removed within 1 to 2 weeks; normal prophylaxis peri-stent removal is adequate

## 2021-03-07 NOTE — Progress Notes (Addendum)
Progress Note  Patient Name: Laurie Richards Date of Encounter: 03/07/2021  [facility] Cardiologist: New  Subjective   Still in afib with rates 120-130s. She is asymptomatic. Started on Eliquis for possible TEE/DCCV.   Inpatient Medications    Scheduled Meds: . apixaban  5 mg Oral BID  . digoxin  0.125 mg Oral Daily  . famotidine  20 mg Oral BID  . fluticasone  2 spray Each Nare Daily  . loratadine  10 mg Oral Daily  . metoprolol tartrate  5 mg Intravenous Q8H   Continuous Infusions: . cefTRIAXone (ROCEPHIN)  IV 2 g (03/06/21 0920)  . lactated ringers 75 mL/hr at 03/06/21 1005   PRN Meds: acetaminophen **OR** acetaminophen, ALPRAZolam, alum & mag hydroxide-simeth, cyclobenzaprine, HYDROmorphone (DILAUDID) injection, lip balm, ondansetron **OR** ondansetron (ZOFRAN) IV, ondansetron (ZOFRAN) IV, traMADol, traMADol   Vital Signs    Vitals:   03/06/21 2309 03/07/21 0000 03/07/21 0304 03/07/21 0514  BP: 116/73  96/70 99/69  Pulse: (!) 118  (!) 111 94  Resp: 19  18 20   Temp: (!) 101.1 F (38.4 C) 98.9 F (37.2 C) 98.5 F (36.9 C) 98.5 F (36.9 C)  TempSrc: Oral  Oral Oral  SpO2: 98%  96% 97%  Weight:      Height:        Intake/Output Summary (Last 24 hours) at 03/07/2021 0808 Last data filed at 03/07/2021 0200 Gross per 24 hour  Intake 550 ml  Output 4250 ml  Net -3700 ml   Last 3 Weights 03/04/2021 08/10/2018 05/06/2018  Weight (lbs) 133 lb 8 oz 116 lb 12 oz 115 lb  Weight (kg) 60.555 kg 52.957 kg 52.164 kg      Telemetry    Afib rates 120-130s, PVCs - Personally Reviewed  ECG    No new - Personally Reviewed  Physical Exam   GEN: No acute distress.   Neck: No JVD Cardiac: Irreg Irreg, no murmurs, rubs, or gallops.  Respiratory: Clear to auscultation bilaterally. GI: Soft, nontender, non-distended  MS: No edema; No deformity. Neuro:  Nonfocal  Psych: Normal affect   Labs    High Sensitivity Troponin:   Recent Labs  Lab 03/03/21 2155  03/03/21 2346 03/05/21 0032 03/05/21 0542  TROPONINIHS 8 7 105* 90*      Chemistry Recent Labs  Lab 03/03/21 2155 03/04/21 2204 03/05/21 0032 03/06/21 0131 03/07/21 0213  NA 133*   < > 134* 137 135  K 3.2*   < > 3.9 3.9 3.1*  CL 100   < > 109 106 100  CO2 21*   < > 19* 26 27  GLUCOSE 130*   < > 189* 117* 107*  BUN 16   < > 13 11 5*  CREATININE 1.05*   < > 0.78 0.73 0.71  CALCIUM 8.5*   < > 7.8* 7.9* 8.1*  PROT 6.7  --   --   --  5.5*  ALBUMIN 3.2*  --   --   --  2.2*  AST 42*  --   --   --  22  ALT 45*  --   --   --  24  ALKPHOS 66  --   --   --  58  BILITOT 1.2  --   --   --  0.7  GFRNONAA >60   < > >60 >60 >60  ANIONGAP 12   < > 6 5 8    < > = values in this interval not displayed.  Hematology Recent Labs  Lab 03/03/21 2155 03/05/21 0032 03/07/21 0213  WBC 12.8* 11.6* 5.9  RBC 4.93 4.11 4.18  HGB 13.7 11.5* 11.5*  HCT 41.0 34.4* 34.8*  MCV 83.2 83.7 83.3  MCH 27.8 28.0 27.5  MCHC 33.4 33.4 33.0  RDW 14.5 14.9 14.6  PLT 185 134* 181    BNPNo results for input(s): BNP, PROBNP in the last 168 hours.   DDimer  Recent Labs  Lab 03/05/21 0032  DDIMER 2.04*     Radiology    ECHOCARDIOGRAM COMPLETE  Result Date: 03/05/2021    ECHOCARDIOGRAM REPORT   Patient Name:   Laurie Richards Date of Exam: 03/05/2021 Medical Rec #:  [id number]      Height:       64.0 in Accession #:    [id number]     Weight:       133.5 lb Date of Birth:  17-May-1996     BSA:          1.648 m Patient Age:    25 years       BP:           100/76 mmHg Patient Gender: F              HR:           145 bpm. Exam Location:  Inpatient Procedure: 2D Echo, Cardiac Doppler and Color Doppler Indications:    Atrial fibrillation  History:        Patient has prior history of Echocardiogram examinations, most                 recent 09/23/2017. Arrythmias:Atrial Fibrillation.  Sonographer:    Neomia Dear RDCS Referring Phys: [id number] Deno Lunger [facility]  Sonographer Comments: No cardiac surgery noted in  chart IMPRESSIONS  1. Difficult to assess LV function as in Afib with RVR to 140s during study. Left ventricular ejection fraction, by estimation, is 40 to 45%. The left ventricle has mildly decreased function. The left ventricle demonstrates global hypokinesis. There is mild left ventricular hypertrophy. Left ventricular diastolic parameters are indeterminate.  2. Right ventricular systolic function is normal. The right ventricular size is normal. There is normal pulmonary artery systolic pressure. The estimated right ventricular systolic pressure is 29.0 mmHg.  3. The mitral valve is normal in structure. Mild mitral valve regurgitation.  4. The aortic valve is tricuspid. Aortic valve regurgitation is not visualized. No aortic stenosis is present.  5. The inferior vena cava is dilated in size with <50% respiratory variability, suggesting right atrial pressure of 15 mmHg. FINDINGS  Left Ventricle: Left ventricular ejection fraction, by estimation, is 40 to 45%. The left ventricle has mildly decreased function. The left ventricle demonstrates global hypokinesis. The left ventricular internal cavity size was normal in size. There is  mild left ventricular hypertrophy. Left ventricular diastolic parameters are indeterminate. Right Ventricle: The right ventricular size is normal. No increase in right ventricular wall thickness. Right ventricular systolic function is normal. There is normal pulmonary artery systolic pressure. The tricuspid regurgitant velocity is 1.87 m/s, and  with an assumed right atrial pressure of 15 mmHg, the estimated right ventricular systolic pressure is 29.0 mmHg. Left Atrium: Left atrial size was normal in size. Right Atrium: Right atrial size was normal in size. Pericardium: There is no evidence of pericardial effusion. Mitral Valve: The mitral valve is normal in structure. Mild mitral valve regurgitation. Tricuspid Valve: The tricuspid valve is normal in structure. Tricuspid valve  regurgitation is  mild. Aortic Valve: The aortic valve is tricuspid. Aortic valve regurgitation is not visualized. No aortic stenosis is present. Aortic valve mean gradient measures 3.0 mmHg. Aortic valve peak gradient measures 6.0 mmHg. Aortic valve area, by VTI measures 3.07 cm. Pulmonic Valve: The pulmonic valve was not well visualized. Pulmonic valve regurgitation is not visualized. Aorta: The aortic root is normal in size and structure. Venous: The inferior vena cava is dilated in size with less than 50% respiratory variability, suggesting right atrial pressure of 15 mmHg. IAS/Shunts: The interatrial septum was not well visualized.  LEFT VENTRICLE PLAX 2D LVIDd:         4.30 cm LVIDs:         3.80 cm LV PW:         1.00 cm LV IVS:        1.10 cm LVOT diam:     2.00 cm LV SV:         52 LV SV Index:   31 LVOT Area:     3.14 cm  LV Volumes (MOD) LV vol d, MOD A2C: 48.4 ml LV vol d, MOD A4C: 50.5 ml LV vol s, MOD A2C: 33.0 ml LV vol s, MOD A4C: 26.6 ml LV SV MOD A2C:     15.4 ml LV SV MOD A4C:     50.5 ml LV SV MOD BP:      17.7 ml RIGHT VENTRICLE RV S prime:     10.95 cm/s RVOT diam:      3.00 cm LEFT ATRIUM             Index       RIGHT ATRIUM           Index LA diam:        3.50 cm 2.12 cm/m  RA Area:     12.10 cm LA Vol (A2C):   45.1 ml 27.35 ml/m RA Volume:   28.80 ml  17.48 ml/m LA Vol (A4C):   32.0 ml 19.42 ml/m LA Biplane Vol: 31.2 ml 18.94 ml/m  AORTIC VALVE AV Area (Vmax):    2.63 cm AV Area (Vmean):   2.65 cm AV Area (VTI):     3.07 cm AV Vmax:           122.00 cm/s PULMONARY ARTERY AV Vmean:          78.600 cm/s MPA diam:        2.60 cm AV VTI:            0.169 m AV Peak Grad:      6.0 mmHg AV Mean Grad:      3.0 mmHg LVOT Vmax:         102.00 cm/s LVOT Vmean:        66.300 cm/s LVOT VTI:          0.165 m LVOT/AV VTI ratio: 0.98  AORTA Ao Root diam: 2.40 cm Ao Asc diam:  2.10 cm MITRAL VALVE               TRICUSPID VALVE MV Area (PHT): 13.55 cm   TR Peak grad:   14.0 mmHg MV Decel Time: 56  msec     TR Vmax:        187.00 cm/s MV E velocity: 87.80 cm/s                            SHUNTS  Systemic VTI:  0.16 m                            Systemic Diam: 2.00 cm                            Pulmonic Diam: 3.00 cm Epifanio Lesches MD Electronically signed by Epifanio Lesches MD Signature Date/Time: 03/05/2021/1:21:38 PM    Final     Cardiac Studies    Echo 03/05/21 1. Difficult to assess LV function as in Afib with RVR to 140s during  study. Left ventricular ejection fraction, by estimation, is 40 to 45%.  The left ventricle has mildly decreased function. The left ventricle  demonstrates global hypokinesis. There is  mild left ventricular hypertrophy. Left ventricular diastolic parameters  are indeterminate.  2. Right ventricular systolic function is normal. The right ventricular  size is normal. There is normal pulmonary artery systolic pressure. The  estimated right ventricular systolic pressure is 29.0 mmHg.  3. The mitral valve is normal in structure. Mild mitral valve  regurgitation.  4. The aortic valve is tricuspid. Aortic valve regurgitation is not  visualized. No aortic stenosis is present.  5. The inferior vena cava is dilated in size with <50% respiratory  variability, suggesting right atrial pressure of 15 mmHg.    Patient Profile     25 y.o. female of PAD not on AC (dx in 2018), hyperthyroidism, IVDU/ETOH abuse (quit 11 months ago) who is being seen for rapid afib.   Assessment & Plan    Paroxysmal Afib, now with RVR - diagnosed back in 2018 in the setting of UTI and was treated with Toprol and dilt and converted to SR. She was not started on a/c due to [facility] of 1 (female). She was lost to follow-up - Presented with acute pyelonephritis found to have renal stone s/p stenting, and noted to be in Afib RVR. HS trop 105>90, likely demand ischemia - Started on IV digoxin loaded with 0.5mg  followed by 0.25mg  Q6H with some heart  rate improvement. Now on digoxin 0.25mg  daily - Echo this admission showed minimally reduced EF 40-45%, global  Hypokinesis, mild MR - Bps initially soft and rates still significantly elevated, and EP consulted  - started on IV metoprolol 5mg  Q8H. Bps soft overnight, cannot likely titrate up.  - Rates are better but still elevated - CHADSVASC 1 for female. 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 Agree with above.   Sleeping comfortably, no complaints.  Normal respiratory effort.  Plan will be for TEE cardioversion on Monday at 11:30 AM, she is on the schedule, n.p.o. past midnight on Sunday.  Antibiotics per ID team.  Note reviewed.  Now on metoprolol as well as digoxin.  Wednesday, MD

## 2021-03-08 LAB — CBC
HCT: 40.5 % (ref 36.0–46.0)
Hemoglobin: 13.4 g/dL (ref 12.0–15.0)
MCH: 27.5 pg (ref 26.0–34.0)
MCHC: 33.1 g/dL (ref 30.0–36.0)
MCV: 83.2 fL (ref 80.0–100.0)
Platelets: 212 10*3/uL (ref 150–400)
RBC: 4.87 MIL/uL (ref 3.87–5.11)
RDW: 14.5 % (ref 11.5–15.5)
WBC: 7.5 10*3/uL (ref 4.0–10.5)
nRBC: 0 % (ref 0.0–0.2)

## 2021-03-08 LAB — BASIC METABOLIC PANEL
Anion gap: 10 (ref 5–15)
BUN: 6 mg/dL (ref 6–20)
CO2: 24 mmol/L (ref 22–32)
Calcium: 8.3 mg/dL — ABNORMAL LOW (ref 8.9–10.3)
Chloride: 97 mmol/L — ABNORMAL LOW (ref 98–111)
Creatinine, Ser: 0.81 mg/dL (ref 0.44–1.00)
GFR, Estimated: >60 mL/min (ref >60)
Glucose, Bld: 90 mg/dL (ref 70–99)
Potassium: 4.6 mmol/L (ref 3.5–5.1)
Sodium: 131 mmol/L — ABNORMAL LOW (ref 135–145)

## 2021-03-08 LAB — MAGNESIUM: Magnesium: 2.2 mg/dL (ref 1.7–2.4)

## 2021-03-08 MED ORDER — POLYETHYLENE GLYCOL 3350 17 G PO PACK: 17.0000 g | PACK | Freq: Every day | ORAL | Status: DC

## 2021-03-08 MED ORDER — SENNA 8.6 MG PO TABS
2.0000 | ORAL_TABLET | Freq: Every day | ORAL | Status: DC
  Administered 2021-03-09: 17.2 mg via ORAL
  Filled 2021-03-08: qty 2

## 2021-03-08 MED ORDER — METOPROLOL TARTRATE 25 MG PO TABS
25.0000 mg | ORAL_TABLET | Freq: Two times a day (BID) | ORAL | Status: DC
  Administered 2021-03-08 – 2021-03-09 (×3): 25 mg via ORAL
  Filled 2021-03-08 (×3): qty 1

## 2021-03-08 NOTE — Progress Notes (Signed)
Mobility Specialist - Progress Note   03/08/21 1218  Mobility  Activity Ambulated in hall  Level of Assistance Independent  Assistive Device None  Distance Ambulated (ft) 470 ft  Mobility Response Tolerated well  Mobility performed by Mobility specialist  $Mobility charge 1 Mobility   Pre-mobility: 107 HR, 98/68 BP During mobility: 130 HR Post-mobility: 116 HR, 106/71 BP  Pt denied any chest pain or palpitations w/ ambulation. Pt back in bed after walk w/ visitors in room.   Mamie Levers Mobility Specialist Mobility Specialist Phone: [phone]

## 2021-03-08 NOTE — Progress Notes (Signed)
Progress Note  Patient Name: Laurie Richards Date of Encounter: 03/08/2021  [facility] Cardiologist: Anne Fu new.   Subjective   Feels better.  Family members in room.  Converted to normal rhythm.  Inpatient Medications    Scheduled Meds: . apixaban  5 mg Oral BID  . cephALEXin  500 mg Oral QID  . digoxin  0.125 mg Oral Daily  . famotidine  20 mg Oral BID  . fluticasone  2 spray Each Nare Daily  . loratadine  10 mg Oral Daily  . metoprolol tartrate  5 mg Intravenous Q8H  . polyethylene glycol  17 g Oral Daily  . senna  2 tablet Oral Daily   Continuous Infusions:  PRN Meds: acetaminophen **OR** acetaminophen, ALPRAZolam, alum & mag hydroxide-simeth, cyclobenzaprine, HYDROmorphone (DILAUDID) injection, lip balm, ondansetron **OR** ondansetron (ZOFRAN) IV, traMADol   Vital Signs    Vitals:   03/08/21 0303 03/08/21 0516 03/08/21 0738 03/08/21 1041  BP: (!) 88/60 92/64 (!) 88/60 100/67  Pulse: 92  100   Resp: 17  17 18   Temp: 98.2 F (36.8 C)  98.7 F (37.1 C) 98.6 F (37 C)  TempSrc: Oral  Oral Oral  SpO2: 93%  97% 98%  Weight:      Height:        Intake/Output Summary (Last 24 hours) at 03/08/2021 1156 Last data filed at 03/08/2021 0600 Gross per 24 hour  Intake 320 ml  Output 3750 ml  Net -3430 ml   Last 3 Weights 03/04/2021 08/10/2018 05/06/2018  Weight (lbs) 133 lb 8 oz 116 lb 12 oz 115 lb  Weight (kg) 60.555 kg 52.957 kg 52.164 kg      Telemetry    Currently sinus rhythm/sinus tachycardia 105, conversion from atrial fibrillation noted yesterday at approximately 2 PM- Personally Reviewed  ECG    Pending to document sinus rhythm/sinus tachycardia- Personally Reviewed  Physical Exam   GEN: No acute distress.  Thin Neck: No JVD Cardiac:  Regular, mildly tachycardic, no murmurs, rubs, or gallops.  Respiratory: Clear to auscultation bilaterally. GI: Soft, nontender, non-distended  MS: No edema; No deformity. Neuro:  Nonfocal  Psych: Normal affect    Labs    High Sensitivity Troponin:   Recent Labs  Lab 03/03/21 2155 03/03/21 2346 03/05/21 0032 03/05/21 0542  TROPONINIHS 8 7 105* 90*      Chemistry Recent Labs  Lab 03/03/21 2155 03/04/21 2204 03/06/21 0131 03/07/21 0213 03/08/21 0030  NA 133*   < > 137 135 131*  K 3.2*   < > 3.9 3.1* 4.6  CL 100   < > 106 100 97*  CO2 21*   < > 26 27 24   GLUCOSE 130*   < > 117* 107* 90  BUN 16   < > 11 5* 6  CREATININE 1.05*   < > 0.73 0.71 0.81  CALCIUM 8.5*   < > 7.9* 8.1* 8.3*  PROT 6.7  --   --  5.5*  --   ALBUMIN 3.2*  --   --  2.2*  --   AST 42*  --   --  22  --   ALT 45*  --   --  24  --   ALKPHOS 66  --   --  58  --   BILITOT 1.2  --   --  0.7  --   GFRNONAA >60   < > >60 >60 >60  ANIONGAP 12   < > 5 8 10    < > =  values in this interval not displayed.     Hematology Recent Labs  Lab 03/05/21 0032 03/07/21 0213 03/08/21 0030  WBC 11.6* 5.9 7.5  RBC 4.11 4.18 4.87  HGB 11.5* 11.5* 13.4  HCT 34.4* 34.8* 40.5  MCV 83.7 83.3 83.2  MCH 28.0 27.5 27.5  MCHC 33.4 33.0 33.1  RDW 14.9 14.6 14.5  PLT 134* 181 212    BNPNo results for input(s): BNP, PROBNP in the last 168 hours.    Patient Profile     25 y.o. female with paroxysmal H fibrillation RVR converted on 03/07/2021 at approximately 2:30 PM with obstructing pyelonephritis right distal ureteral stone.  Prior history of IV drug use alcohol use quit 11 months ago.  Had prior episode of paroxysmal atrial fibrillation back in 2018 in the setting of upper respiratory infection.  Assessment & Plan    Paroxysmal atrial fibrillation - Auto conversion successful -Lets continue with digoxin 0.125 mg daily until follow-up appointment - Lets also change her metoprolol 5 mg IV every 8 hours which she has received 5 total doses over the past 2 days to metoprolol tartrate 25 mg twice daily.  Lets continue this as outpatient as well. -We will cancel TEE cardioversion for Monday.  We will go ahead and sign off from  cardiology perspective.  We will establish follow-up.     For questions or updates, please contact [facility] Please consult [email or web address] for contact info under        Signed, Donato Schultz, MD  03/08/2021, 11:56 AM

## 2021-03-08 NOTE — Progress Notes (Signed)
Pt has sinus rhythm/ sinus tach. Called CCMD to confirm when patient converted from atrial fibrillation to sinus rhythm.

## 2021-03-08 NOTE — Progress Notes (Signed)
PROGRESS NOTE   JOSALYN ESTELA  UVO:[id number]    DOB: [birth year]    DOA: 03/03/2021  PCP: Patient, No Pcp Per (Inactive)   I have briefly reviewed patients previous medical records in [facility].  Chief Complaint  Patient presents with  . 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  Admitted for sepsis due to E. coli bacteremia with right-sided pyelonephritis complicating ureteral stone.  S/p cystoscopy with right ureteral stent by urology.  Hospital course complicated by A. fib with RVR, unable to control with meds alone, was planned for possible TEE/DCCV on 5/2 but patient converted to sinus rhythm.  Assessment & Plan:  Principal Problem:   Pyelonephritis Active Problems:   Tachycardia   Hypokalemia   Sepsis (HCC)   Ureteral stone with hydronephrosis   Paroxysmal A. fib, now with RVR: Diagnosed in 2018 in the setting of UTI, treated with Toprol and diltiazem and converted to SR.  She was not started on anticoagulation due to [facility] (female).  She was then lost to follow-up.  Now again with RVR.  Cardiology and EP cardiology consulted.  Loaded with IV digoxin and continuing digoxin 0.125 mg daily.  EP started her on IV metoprolol 5 mg q. 8 hourly.  Due to soft blood pressures and low EF, unable to use Cardizem.  Also due to soft blood pressures, unable to uptitrate beta-blockers.  TSH 0.736.  2D echo with LVEF 40-45% and global hypokinesis, likely rate related. CHA2DS2VASc score is 1 due to gender.  Patient started on Eliquis 5 mg twice daily for possible TEE/DCCV on 5/2.  CTA chest 4/27: No PE.  Patient  however reverted to sinus rhythm.  Continue telemetry.  Await cardiology follow-up and might change IV metoprolol to oral.  Sepsis, POA due to E. coli acute bacteremia secondary to acute pyelonephritis complicated by right ureteral stone: CT abdomen and pelvis 4/6: Acute right pyelonephritis, new 4 mm calculus in the right hemipelvis.  Urology was consulted and she underwent cystoscopy with right retrograde pyelogram and insertion of right ureteral stent for right ureteral stone with early pyelonephritis, on 4/27.  Treated with IV ceftriaxone 4/25 > 4/28.  Blood and urine culture from 4/25: E. coli, resistant to ampicillin, ampicillin/sulbactam but sensitive to ceftriaxone and others.  As per ID note in the chart 4/29, transitioned to Keflex 500 mg 4 times daily on 4/29 while here and on discharge could do cefadroxil twice daily for total 10 days treatment including days of ceftriaxone.  ID also recommends discussing with urology to see if stent to be removed within 1 to 2 weeks, normal prophylaxis peri stent removal is adequate.  I discussed with Dr. Berneice Heinrich, urology on 4/29: His office is in the process of arranging outpatient follow-up in 2 to 3 weeks for stone and stent removal, agrees with total 10 days of antibiotic course, he will try and see her in the hospital over the weekend.  She has some stent related spasms but better overall  Cardiomyopathy 2D echo showed LVEF of 40- 45% with global hypokinesis.  Suspect rate related due to A. fib with RVR.  Needs repeat 2D echo in a couple of months after heart rates  controlled.  Cardiology following  Hypokalemia/hypomagnesemia Replaced.  Acute anemia Etiology really not clear.  Could be related to acute illness/infectious etiology.  Hemoglobin stable in the 11.5 range for last 2 days.  Transient thrombocytopenia resolved.  Hemoglobin up to 13.4 so suspect the 11 g range over the last 2 days were not accurate.  No anemia.  Anxiety Received a dose of  Xanax early on in admission.  Stable.  Body mass index is 22.92 kg/m.     DVT prophylaxis: Place TED hose Start: 03/04/21 [id number]     Code Status: Full Code Family Communication: Discussed in detail with patient's "sister/mom" that patient refers to on 4/29.  None at bedside today.   Disposition:  Status is: Inpatient  Remains inpatient appropriate because:Inpatient level of care appropriate due to severity of illness   Dispo:  Patient From: Home  Planned Disposition: Home.  Now that she has reverted to sinus rhythm, await cardiology to optimize her cardiac meds and hopefully can DC home in the next 24 to 48 hours.  Discussed with RN regarding mobilizing patient.  Medically stable for discharge: No          Consultants:   Cardiology Urology ID  Procedures:   cystoscopy with right retrograde pyelogram and insertion of right ureteral stent for right ureteral stone with early pyelonephritis, on 4/27.  Antimicrobials:    Anti-infectives (From admission, onward)   Start     Dose/Rate Route Frequency Ordered Stop   03/07/21 1000  cephALEXin (KEFLEX) capsule 500 mg        500 mg Oral 4 times daily 03/07/21 0910     03/07/21 0000  cefadroxil (DURICEF) 500 MG capsule        1,000 mg Oral 2 times daily 03/07/21 1300 03/11/21 2359   03/04/21 1000  cefTRIAXone (ROCEPHIN) 2 g in sodium chloride 0.9 % 100 mL IVPB  Status:  Discontinued        2 g 200 mL/hr over 30 Minutes Intravenous Every 24 hours 03/04/21 0709 03/07/21 0910   03/04/21 0730  cefTRIAXone (ROCEPHIN) 1 g in sodium chloride 0.9 % 100 mL IVPB  Status:  Discontinued        1 g 200 mL/hr over 30 Minutes Intravenous Every 24 hours 03/04/21 0720 03/04/21 0725   03/04/21 0215  cefTRIAXone (ROCEPHIN) 1 g in sodium chloride 0.9 % 100 mL IVPB        1 g 200 mL/hr over 30 Minutes Intravenous  Once 03/04/21 0206 03/04/21 0301        Subjective:  No BM since Monday of ureteral stent.  Some lower abdominal spasm/cramps  towards the end of micturition, better compared to before, relieved with antispasmodics.  Denies any other complaints.  Objective:   Vitals:   03/08/21 0303 03/08/21 0516 03/08/21 0738 03/08/21 1041  BP: (!) 88/60 92/64 (!) 88/60 100/67  Pulse: 92  100   Resp: 17  17 18   Temp: 98.2 F (36.8 C)  98.7 F (37.1 C) 98.6 F (37 C)  TempSrc: Oral  Oral Oral  SpO2: 93%  97% 98%  Weight:      Height:        General exam: Pleasant young female, moderately built and thinly nourished, lying comfortably propped up in bed without distress. Respiratory system: Clear to auscultation.  No increased work of breathing. Cardiovascular system: S1 & S2 heard, RRR. No JVD, murmurs, rubs, gallops or clicks.  Trace bilateral ankle edema but seems better compared to yesterday.  Telemetry personally reviewed: SR-mild ST at times in the low 100s. Gastrointestinal system: Abdomen is nondistended, soft and minimal tenderness in the right lower quadrant but without rigidity, guarding or rebound. No organomegaly or masses felt. Normal bowel sounds heard. Central nervous system: Alert and oriented. No focal neurological deficits. Extremities: Symmetric 5 x 5 power. Skin: No rashes, lesions or ulcers Psychiatry: Judgement and insight appear normal. Mood & affect appropriate.     Data Reviewed:   I have personally reviewed following labs and imaging studies   CBC: Recent Labs  Lab 03/05/21 0032 03/07/21 0213 03/08/21 0030  WBC 11.6* 5.9 7.5  HGB 11.5* 11.5* 13.4  HCT 34.4* 34.8* 40.5  MCV 83.7 83.3 83.2  PLT 134* 181 212    Basic Metabolic Panel: Recent Labs  Lab 03/05/21 0032 03/06/21 0131 03/07/21 0213 03/07/21 0214 03/08/21 0030  NA 134* 137 135  --  131*  K 3.9 3.9 3.1*  --  4.6  CL 109 106 100  --  97*  CO2 19* 26 27  --  24  GLUCOSE 189* 117* 107*  --  90  BUN 13 11 5*  --  6  CREATININE 0.78 0.73 0.71  --  0.81  CALCIUM 7.8* 7.9* 8.1*  --  8.3*  MG 2.3  --   --  1.5* 2.2     Liver Function Tests: Recent Labs  Lab 03/03/21 2155 03/07/21 0213  AST 42* 22  ALT 45* 24  ALKPHOS 66 58  BILITOT 1.2 0.7  PROT 6.7 5.5*  ALBUMIN 3.2* 2.2*    CBG: Recent Labs  Lab 03/04/21 0539 03/04/21 2128  GLUCAP 84 173*    Microbiology Studies:   Recent Results (from the past 240 hour(s))  Resp Panel by RT-PCR (Flu A&B, Covid) Nasopharyngeal Swab     Status: None   Collection Time: 03/03/21  9:54 PM   Specimen: Nasopharyngeal Swab; Nasopharyngeal(NP) swabs in vial transport medium  Result Value Ref Range Status   SARS Coronavirus 2 by RT PCR NEGATIVE NEGATIVE Final    Comment: (NOTE) SARS-CoV-2 target nucleic acids are NOT DETECTED.  The SARS-CoV-2 RNA is generally detectable in upper respiratory specimens during the acute phase of infection. The lowest concentration of SARS-CoV-2 viral copies this assay can detect is 138 copies/mL. A negative result does not preclude SARS-Cov-2 infection and should not be used as the sole basis for treatment or other patient management decisions. A negative result may occur with  improper specimen collection/handling, submission of specimen other than nasopharyngeal swab, presence of viral mutation(s) within the areas targeted by this assay, and inadequate number of viral copies(<138 copies/mL). A negative result must be combined with clinical observations, patient history, and epidemiological information. The expected result is Negative.  Fact Sheet for Patients:  BloggerCourse.com  Fact Sheet for Healthcare Providers:  SeriousBroker.it  This test is no t yet approved or cleared by the [place] FDA and  has been authorized for detection and/or diagnosis of SARS-CoV-2 by FDA under an Emergency Use Authorization (EUA). This EUA will remain  in effect (meaning this test can be used) for the duration of the COVID-19 declaration under Section 564(b)(1) of the Act,  21 U.S.C.section 360bbb-3(b)(1), unless the authorization is terminated  or revoked sooner.       Influenza A by PCR NEGATIVE NEGATIVE Final   Influenza B by PCR NEGATIVE NEGATIVE Final    Comment: (NOTE) The Xpert Xpress SARS-CoV-2/FLU/RSV plus assay is intended as an aid in the  diagnosis of influenza from Nasopharyngeal swab specimens and should not be used as a sole basis for treatment. Nasal washings and aspirates are unacceptable for Xpert Xpress SARS-CoV-2/FLU/RSV testing.  Fact Sheet for Patients: BloggerCourse.com  Fact Sheet for Healthcare Providers: SeriousBroker.it  This test is not yet approved or cleared by the [place] FDA and has been authorized for detection and/or diagnosis of SARS-CoV-2 by FDA under an Emergency Use Authorization (EUA). This EUA will remain in effect (meaning this test can be used) for the duration of the COVID-19 declaration under Section 564(b)(1) of the Act, 21 U.S.C. section 360bbb-3(b)(1), unless the authorization is terminated or revoked.  Performed at [facility], [address], [place], [place] [address]   Urine culture     Status: Abnormal   Collection Time: 03/04/21  1:39 AM   Specimen: Urine, Random  Result Value Ref Range Status   Specimen Description URINE, RANDOM  Final   Special Requests   Final    NONE Performed at [facility], [address] [place], [place] [address]    Culture >=100,000 COLONIES/mL ESCHERICHIA COLI (A)  Final   Report Status 03/06/2021 FINAL  Final   Organism ID, Bacteria ESCHERICHIA COLI (A)  Final      Susceptibility   Escherichia coli - MIC*    AMPICILLIN >=32 RESISTANT Resistant     CEFAZOLIN 16 SENSITIVE Sensitive     CEFEPIME <=0.12 SENSITIVE Sensitive     CEFTRIAXONE <=0.25 SENSITIVE Sensitive     CIPROFLOXACIN <=0.25 SENSITIVE Sensitive     GENTAMICIN <=1 SENSITIVE Sensitive     IMIPENEM <=0.25 SENSITIVE Sensitive      NITROFURANTOIN <=16 SENSITIVE Sensitive     TRIMETH/SULFA <=20 SENSITIVE Sensitive     AMPICILLIN/SULBACTAM >=32 RESISTANT Resistant     PIP/TAZO <=4 SENSITIVE Sensitive     * >=100,000 COLONIES/mL ESCHERICHIA COLI  Culture, blood (single)     Status: Abnormal   Collection Time: 03/04/21  2:57 AM   Specimen: BLOOD  Result Value Ref Range Status   Specimen Description BLOOD SITE NOT SPECIFIED  Final   Special Requests   Final    BOTTLES DRAWN AEROBIC AND ANAEROBIC Blood Culture results may not be optimal due to an excessive volume of blood received in culture bottles   Culture  Setup Time   Final    AEROBIC BOTTLE ONLY GRAM NEGATIVE RODS CRITICAL RESULT CALLED TO, READ BACK BY AND VERIFIED WITH: Melven [facility] 03/05/21 0037 JDW Performed at [facility], [address] [place], [place] [address]    Culture ESCHERICHIA COLI (A)  Final   Report Status 03/06/2021 FINAL  Final   Organism ID, Bacteria ESCHERICHIA COLI  Final      Susceptibility   Escherichia coli - MIC*    AMPICILLIN >=32 RESISTANT Resistant     CEFAZOLIN <=4 SENSITIVE Sensitive     CEFEPIME <=0.12 SENSITIVE Sensitive     CEFTAZIDIME <=1 SENSITIVE Sensitive     CEFTRIAXONE <=0.25 SENSITIVE Sensitive     CIPROFLOXACIN <=0.25 SENSITIVE Sensitive     GENTAMICIN <=1 SENSITIVE Sensitive     IMIPENEM <=0.25 SENSITIVE Sensitive     TRIMETH/SULFA <=20 SENSITIVE Sensitive     AMPICILLIN/SULBACTAM >=32 RESISTANT Resistant     PIP/TAZO <=4 SENSITIVE Sensitive     * ESCHERICHIA COLI  Blood Culture ID Panel (Reflexed)     Status: Abnormal   Collection Time: 03/04/21  2:57 AM  Result Value Ref Range Status  Enterococcus faecalis NOT DETECTED NOT DETECTED Final   Enterococcus Faecium NOT DETECTED NOT DETECTED Final   Listeria monocytogenes NOT DETECTED NOT DETECTED Final   Staphylococcus species NOT DETECTED NOT DETECTED Final   Staphylococcus aureus (BCID) NOT DETECTED NOT DETECTED Final   Staphylococcus epidermidis  NOT DETECTED NOT DETECTED Final   Staphylococcus lugdunensis NOT DETECTED NOT DETECTED Final   Streptococcus species NOT DETECTED NOT DETECTED Final   Streptococcus agalactiae NOT DETECTED NOT DETECTED Final   Streptococcus pneumoniae NOT DETECTED NOT DETECTED Final   Streptococcus pyogenes NOT DETECTED NOT DETECTED Final   A.calcoaceticus-baumannii NOT DETECTED NOT DETECTED Final   Bacteroides fragilis NOT DETECTED NOT DETECTED Final   Enterobacterales DETECTED (A) NOT DETECTED Final    Comment: Enterobacterales represent a large order of gram negative bacteria, not a single organism. CRITICAL RESULT CALLED TO, READ BACK BY AND VERIFIED WITH: J LEDFORD PHARMD 03/05/21 0037 JDW    Enterobacter cloacae complex NOT DETECTED NOT DETECTED Final   Escherichia coli DETECTED (A) NOT DETECTED Final    Comment: CRITICAL RESULT CALLED TO, READ BACK BY AND VERIFIED WITH: J LEDFORD PHARMD 03/05/21 0037 JDW    Klebsiella aerogenes NOT DETECTED NOT DETECTED Final   Klebsiella oxytoca NOT DETECTED NOT DETECTED Final   Klebsiella pneumoniae NOT DETECTED NOT DETECTED Final   Proteus species NOT DETECTED NOT DETECTED Final   Salmonella species NOT DETECTED NOT DETECTED Final   Serratia marcescens NOT DETECTED NOT DETECTED Final   Haemophilus influenzae NOT DETECTED NOT DETECTED Final   Neisseria meningitidis NOT DETECTED NOT DETECTED Final   Pseudomonas aeruginosa NOT DETECTED NOT DETECTED Final   Stenotrophomonas maltophilia NOT DETECTED NOT DETECTED Final   Candida albicans NOT DETECTED NOT DETECTED Final   Candida auris NOT DETECTED NOT DETECTED Final   Candida glabrata NOT DETECTED NOT DETECTED Final   Candida krusei NOT DETECTED NOT DETECTED Final   Candida parapsilosis NOT DETECTED NOT DETECTED Final   Candida tropicalis NOT DETECTED NOT DETECTED Final   Cryptococcus neoformans/gattii NOT DETECTED NOT DETECTED Final   CTX-M ESBL NOT DETECTED NOT DETECTED Final   Carbapenem resistance IMP NOT  DETECTED NOT DETECTED Final   Carbapenem resistance KPC NOT DETECTED NOT DETECTED Final   Carbapenem resistance NDM NOT DETECTED NOT DETECTED Final   Carbapenem resist OXA 48 LIKE NOT DETECTED NOT DETECTED Final   Carbapenem resistance VIM NOT DETECTED NOT DETECTED Final    Comment: Performed at [facility], [address], [place], [place] [address]     Radiology Studies:  No results found.   Scheduled Meds:   . apixaban  5 mg Oral BID  . cephALEXin  500 mg Oral QID  . digoxin  0.125 mg Oral Daily  . famotidine  20 mg Oral BID  . fluticasone  2 spray Each Nare Daily  . loratadine  10 mg Oral Daily  . metoprolol tartrate  5 mg Intravenous Q8H    Continuous Infusions:      LOS: 4 days     Marcellus Scott, MD, [place], [facility]. [facility]    To contact the attending provider between 7A-7P or the covering provider during after hours 7P-7A, please log into the web site [email or web address] and access using universal [place] password for that web site. If you do not have the password, please call the hospital operator.  03/08/2021, 11:38 AM

## 2021-03-09 MED ORDER — METOPROLOL TARTRATE 25 MG PO TABS: 12.5000 mg | ORAL_TABLET | Freq: Two times a day (BID) | ORAL | 1 refills | Status: DC

## 2021-03-09 MED ORDER — POLYETHYLENE GLYCOL 3350 17 G PO PACK: 17.0000 g | PACK | Freq: Every day | ORAL | 0 refills | Status: DC

## 2021-03-09 MED ORDER — TRAMADOL HCL 50 MG PO TABS: 50.0000 mg | ORAL_TABLET | Freq: Four times a day (QID) | ORAL | 0 refills | Status: DC | PRN

## 2021-03-09 MED ORDER — SENNA 8.6 MG PO TABS: 2.0000 | ORAL_TABLET | Freq: Every day | ORAL | 0 refills | Status: AC

## 2021-03-09 MED ORDER — DIGOXIN 125 MCG PO TABS: 0.1250 mg | ORAL_TABLET | Freq: Every day | ORAL | 0 refills | Status: DC

## 2021-03-09 MED ORDER — CEPHALEXIN 500 MG PO CAPS: 500.0000 mg | ORAL_CAPSULE | Freq: Four times a day (QID) | ORAL | 0 refills | Status: AC

## 2021-03-09 MED ORDER — FAMOTIDINE 20 MG PO TABS: 20.0000 mg | ORAL_TABLET | Freq: Two times a day (BID) | ORAL | 0 refills | Status: AC

## 2021-03-09 MED ORDER — CYCLOBENZAPRINE HCL 10 MG PO TABS: 5.0000 mg | ORAL_TABLET | Freq: Three times a day (TID) | ORAL | 0 refills | Status: AC | PRN

## 2021-03-09 NOTE — Plan of Care (Signed)
Problem: Urinary Elimination: Goal: Signs and symptoms of infection will decrease 03/09/2021 0919 by Durward Fortes, RN Outcome: Adequate for Discharge 03/09/2021 0727 by Durward Fortes, RN Outcome: Progressing   Problem: Education: Goal: Knowledge of disease or condition will improve 03/09/2021 0919 by Durward Fortes, RN Outcome: Adequate for Discharge 03/09/2021 0727 by Durward Fortes, RN Outcome: Progressing Goal: Understanding of medication regimen will improve 03/09/2021 0919 by Durward Fortes, RN Outcome: Adequate for Discharge 03/09/2021 0727 by Durward Fortes, RN Outcome: Progressing Goal: Individualized Educational Video(s) 03/09/2021 0919 by Durward Fortes, RN Outcome: Adequate for Discharge 03/09/2021 0727 by Durward Fortes, RN Outcome: Progressing   Problem: Activity: Goal: Ability to tolerate increased activity will improve 03/09/2021 0919 by Durward Fortes, RN Outcome: Adequate for Discharge 03/09/2021 0727 by Durward Fortes, RN Outcome: Progressing   Problem: Cardiac: Goal: Ability to achieve and maintain adequate cardiopulmonary perfusion will improve 03/09/2021 0919 by Durward Fortes, RN Outcome: Adequate for Discharge 03/09/2021 0727 by Durward Fortes, RN Outcome: Progressing   Problem: Health Behavior/Discharge Planning: Goal: Ability to safely manage health-related needs after discharge will improve 03/09/2021 0919 by Durward Fortes, RN Outcome: Adequate for Discharge 03/09/2021 0727 by Durward Fortes, RN Outcome: Progressing   Problem: Education: Goal: Knowledge of General Education information will improve Description: Including pain rating scale, medication(s)/side effects and non-pharmacologic comfort measures 03/09/2021 0919 by Durward Fortes, RN Outcome: Adequate for Discharge 03/09/2021 0727 by Durward Fortes, RN Outcome: Progressing   Problem: Health Behavior/Discharge Planning: Goal: Ability to manage health-related needs will improve 03/09/2021 0919 by Durward Fortes, RN Outcome: Adequate for Discharge 03/09/2021 0727 by Durward Fortes, RN Outcome: Progressing   Problem: Clinical Measurements: Goal: Ability to maintain clinical measurements within normal limits will improve 03/09/2021 0919 by Durward Fortes, RN Outcome: Adequate for Discharge 03/09/2021 0727 by Durward Fortes, RN Outcome: Progressing Goal: Will remain free from infection 03/09/2021 0919 by Durward Fortes, RN Outcome: Adequate for Discharge 03/09/2021 0727 by Durward Fortes, RN Outcome: Progressing Goal: Diagnostic test results will improve 03/09/2021 0919 by Durward Fortes, RN Outcome: Adequate for Discharge 03/09/2021 0727 by Durward Fortes, RN Outcome: Progressing Goal: Respiratory complications will improve 03/09/2021 0919 by Durward Fortes, RN Outcome: Adequate for Discharge 03/09/2021 0727 by Durward Fortes, RN Outcome: Progressing Goal: Cardiovascular complication will be avoided 03/09/2021 0919 by Durward Fortes, RN Outcome: Adequate for Discharge 03/09/2021 0727 by Durward Fortes, RN Outcome: Progressing   Problem: Nutrition: Goal: Adequate nutrition will be maintained 03/09/2021 0919 by Durward Fortes, RN Outcome: Adequate for Discharge 03/09/2021 0727 by Durward Fortes, RN Outcome: Progressing   Problem: Coping: Goal: Level of anxiety will decrease 03/09/2021 0919 by Durward Fortes, RN Outcome: Adequate for Discharge 03/09/2021 0727 by Durward Fortes, RN Outcome: Progressing   Problem: Elimination: Goal: Will not experience complications related to bowel motility 03/09/2021 0919 by Durward Fortes, RN Outcome: Adequate for Discharge 03/09/2021 0727 by Durward Fortes, RN Outcome: Progressing Goal: Will not experience complications related to urinary retention 03/09/2021 0919 by Durward Fortes, RN Outcome: Adequate for Discharge 03/09/2021 0727 by Durward Fortes, RN Outcome: Progressing   Problem: Pain Managment: Goal: General experience of comfort will improve 03/09/2021 0919  by Durward Fortes, RN Outcome: Adequate for Discharge 03/09/2021 0727 by Durward Fortes, RN Outcome: Progressing   Problem: Safety: Goal: Ability to remain free from  injury will improve 03/09/2021 0919 by Durward Fortes, RN Outcome: Adequate for Discharge 03/09/2021 0727 by Durward Fortes, RN Outcome: Progressing

## 2021-03-09 NOTE — Discharge Summary (Addendum)
[facility]  Physician Discharge Summary   Patient ID: Laurie Richards MRN: [id number] DOB/AGE: 1996-09-23 24 y.o.  Admit date: 03/03/2021 Discharge date: 03/09/2021  PCP: Patient, No Pcp Per (Inactive)  DISCHARGE DIAGNOSES:  Acute pyelonephritis Ureteral stone status post stent placement E. coli bacteremia Sepsis present on admission Paroxysmal atrial fibrillation   RECOMMENDATIONS FOR OUTPATIENT FOLLOW UP: 1. 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  Admitted for sepsis due to E. coli bacteremia with right-sided pyelonephritis complicating ureteral stone.  S/p cystoscopy with right ureteral stent by urology.  Hospital course complicated by A. fib with RVR  Consultations:  Cardiology  Urology  ID  Procedures: cystoscopy with right retrograde pyelogram and insertion of right ureteral stent for right ureteral stone with early pyelonephritis, on 4/27.   HOSPITAL COURSE:   Sepsis, POA due to E. coli acute bacteremia secondary to acute pyelonephritis complicated by right ureteral stone: CT abdomen and pelvis 4/6: Acute right pyelonephritis, new 4 mm calculus in the right hemipelvis.  Urology was consulted and she underwent cystoscopy with right retrograde pyelogram and insertion of right ureteral stent for right ureteral stone with early pyelonephritis, on 4/27.  Treated with IV ceftriaxone 4/25 > 4/28.  Blood and urine culture from  4/25: E. coli, resistant to ampicillin, ampicillin/sulbactam but sensitive to ceftriaxone and others.   Patient was transitioned to Keflex and was discharged on the same. Patient to follow-up with urology.  They will schedule appointment.  Paroxysmal atrial fibrillation  Diagnosed in 2018 in the setting of UTI, treated with Toprol and diltiazem and converted to SR.  She was not started on anticoagulation due to [facility] of 1 (female).  She was then lost to follow-up.   Patient was seen by cardiology and electrophysiology.  Was given digoxin and low-dose metoprolol.  TSH was 0.736.  Echocardiogram shows EF of 40 to 45% with global hypokinesis CHADS2 vascular score was only 1. Patient was not responding to medications although plan initially was to do cardioversion.  Patient was started on Eliquis.  However she converted to sinus rhythm.  Discussed with cardiology who recommended discontinuing Eliquis.  Will be discharged on metoprolol and digoxin.  Of note CT angiogram on 4/27 was negative for PE.    Chronic systolic CHF  2D echo showed LVEF of 40- 45% with global hypokinesis.  Suspect rate related due to A. fib with RVR.  Needs repeat 2D echo in a couple of months after heart rates controlled.  Cardiology will arrange.  Hypokalemia/hypomagnesemia This was corrected.  Acute anemia Etiology really not clear.  Could be related to acute illness/infectious etiology.    Hemoglobin has been stable.  Patient is stable.  Cleared by cardiology and ID.  Urology will arrange outpatient follow-up as will cardiology.  Okay for discharge today.    PERTINENT LABS:  The results of significant diagnostics from this hospitalization (including imaging, microbiology, ancillary and laboratory) are listed below for reference.    Microbiology: Recent Results (from the past 240 hour(s))  Resp Panel by RT-PCR (Flu A&B, Covid) Nasopharyngeal Swab  Status: None   Collection Time: 03/03/21  9:54 PM    Specimen: Nasopharyngeal Swab; Nasopharyngeal(NP) swabs in vial transport medium  Result Value Ref Range Status   SARS Coronavirus 2 by RT PCR NEGATIVE NEGATIVE Final    Comment: (NOTE) SARS-CoV-2 target nucleic acids are NOT DETECTED.  The SARS-CoV-2 RNA is generally detectable in upper respiratory specimens during the acute phase of infection. The lowest concentration of SARS-CoV-2 viral copies this assay can detect is 138 copies/mL. A negative result does not preclude SARS-Cov-2 infection and should not be used as the sole basis for treatment or other patient management decisions. A negative result may occur with  improper specimen collection/handling, submission of specimen other than nasopharyngeal swab, presence of viral mutation(s) within the areas targeted by this assay, and inadequate number of viral copies(<138 copies/mL). A negative result must be combined with clinical observations, patient history, and epidemiological information. The expected result is Negative.  Fact Sheet for Patients:  BloggerCourse.com  Fact Sheet for Healthcare Providers:  SeriousBroker.it  This test is no t yet approved or cleared by the [place] FDA and  has been authorized for detection and/or diagnosis of SARS-CoV-2 by FDA under an Emergency Use Authorization (EUA). This EUA will remain  in effect (meaning this test can be used) for the duration of the COVID-19 declaration under Section 564(b)(1) of the Act, 21 U.S.C.section 360bbb-3(b)(1), unless the authorization is terminated  or revoked sooner.       Influenza A by PCR NEGATIVE NEGATIVE Final   Influenza B by PCR NEGATIVE NEGATIVE Final    Comment: (NOTE) The Xpert Xpress SARS-CoV-2/FLU/RSV plus assay is intended as an aid in the diagnosis of influenza from Nasopharyngeal swab specimens and should not be used as a sole basis for treatment. Nasal washings and aspirates are  unacceptable for Xpert Xpress SARS-CoV-2/FLU/RSV testing.  Fact Sheet for Patients: BloggerCourse.com  Fact Sheet for Healthcare Providers: SeriousBroker.it  This test is not yet approved or cleared by the [place] FDA and has been authorized for detection and/or diagnosis of SARS-CoV-2 by FDA under an Emergency Use Authorization (EUA). This EUA will remain in effect (meaning this test can be used) for the duration of the COVID-19 declaration under Section 564(b)(1) of the Act, 21 U.S.C. section 360bbb-3(b)(1), unless the authorization is terminated or revoked.  Performed at [facility], [address], [place], [place] [address]   Urine culture     Status: Abnormal   Collection Time: 03/04/21  1:39 AM   Specimen: Urine, Random  Result Value Ref Range Status   Specimen Description URINE, RANDOM  Final   Special Requests   Final    NONE Performed at [facility], [address], [place], [place] [address]    Culture >=100,000 COLONIES/mL ESCHERICHIA COLI (A)  Final   Report Status 03/06/2021 FINAL  Final   Organism ID, Bacteria ESCHERICHIA COLI (A)  Final      Susceptibility   Escherichia coli - MIC*    AMPICILLIN >=32 RESISTANT Resistant     CEFAZOLIN 16 SENSITIVE Sensitive     CEFEPIME <=0.12 SENSITIVE Sensitive     CEFTRIAXONE <=0.25 SENSITIVE Sensitive     CIPROFLOXACIN <=0.25 SENSITIVE Sensitive     GENTAMICIN <=1 SENSITIVE Sensitive     IMIPENEM <=0.25 SENSITIVE Sensitive     NITROFURANTOIN <=16 SENSITIVE Sensitive     TRIMETH/SULFA <=20 SENSITIVE Sensitive     AMPICILLIN/SULBACTAM >=32 RESISTANT Resistant     PIP/TAZO <=4 SENSITIVE  Sensitive     * >=100,000 COLONIES/mL ESCHERICHIA COLI  Culture, blood (single)     Status: Abnormal   Collection Time: 03/04/21  2:57 AM   Specimen: BLOOD  Result Value Ref Range Status   Specimen Description BLOOD SITE NOT SPECIFIED  Final   Special Requests    Final    BOTTLES DRAWN AEROBIC AND ANAEROBIC Blood Culture results may not be optimal due to an excessive volume of blood received in culture bottles   Culture  Setup Time   Final    AEROBIC BOTTLE ONLY GRAM NEGATIVE RODS CRITICAL RESULT CALLED TO, READ BACK BY AND VERIFIED WITH: Melven Sartorius [facility] 03/05/21 0037 JDW Performed at [facility], [address], [place], [place] [address]    Culture ESCHERICHIA COLI (A)  Final   Report Status 03/06/2021 FINAL  Final   Organism ID, Bacteria ESCHERICHIA COLI  Final      Susceptibility   Escherichia coli - MIC*    AMPICILLIN >=32 RESISTANT Resistant     CEFAZOLIN <=4 SENSITIVE Sensitive     CEFEPIME <=0.12 SENSITIVE Sensitive     CEFTAZIDIME <=1 SENSITIVE Sensitive     CEFTRIAXONE <=0.25 SENSITIVE Sensitive     CIPROFLOXACIN <=0.25 SENSITIVE Sensitive     GENTAMICIN <=1 SENSITIVE Sensitive     IMIPENEM <=0.25 SENSITIVE Sensitive     TRIMETH/SULFA <=20 SENSITIVE Sensitive     AMPICILLIN/SULBACTAM >=32 RESISTANT Resistant     PIP/TAZO <=4 SENSITIVE Sensitive     * ESCHERICHIA COLI  Blood Culture ID Panel (Reflexed)     Status: Abnormal   Collection Time: 03/04/21  2:57 AM  Result Value Ref Range Status   Enterococcus faecalis NOT DETECTED NOT DETECTED Final   Enterococcus Faecium NOT DETECTED NOT DETECTED Final   Listeria monocytogenes NOT DETECTED NOT DETECTED Final   Staphylococcus species NOT DETECTED NOT DETECTED Final   Staphylococcus aureus (BCID) NOT DETECTED NOT DETECTED Final   Staphylococcus epidermidis NOT DETECTED NOT DETECTED Final   Staphylococcus lugdunensis NOT DETECTED NOT DETECTED Final   Streptococcus species NOT DETECTED NOT DETECTED Final   Streptococcus agalactiae NOT DETECTED NOT DETECTED Final   Streptococcus pneumoniae NOT DETECTED NOT DETECTED Final   Streptococcus pyogenes NOT DETECTED NOT DETECTED Final   A.calcoaceticus-baumannii NOT DETECTED NOT DETECTED Final   Bacteroides fragilis NOT DETECTED NOT  DETECTED Final   Enterobacterales DETECTED (A) NOT DETECTED Final    Comment: Enterobacterales represent a large order of gram negative bacteria, not a single organism. CRITICAL RESULT CALLED TO, READ BACK BY AND VERIFIED WITH: J LEDFORD PHARMD 03/05/21 0037 JDW    Enterobacter cloacae complex NOT DETECTED NOT DETECTED Final   Escherichia coli DETECTED (A) NOT DETECTED Final    Comment: CRITICAL RESULT CALLED TO, READ BACK BY AND VERIFIED WITH: J LEDFORD PHARMD 03/05/21 0037 JDW    Klebsiella aerogenes NOT DETECTED NOT DETECTED Final   Klebsiella oxytoca NOT DETECTED NOT DETECTED Final   Klebsiella pneumoniae NOT DETECTED NOT DETECTED Final   Proteus species NOT DETECTED NOT DETECTED Final   Salmonella species NOT DETECTED NOT DETECTED Final   Serratia marcescens NOT DETECTED NOT DETECTED Final   Haemophilus influenzae NOT DETECTED NOT DETECTED Final   Neisseria meningitidis NOT DETECTED NOT DETECTED Final   Pseudomonas aeruginosa NOT DETECTED NOT DETECTED Final   Stenotrophomonas maltophilia NOT DETECTED NOT DETECTED Final   Candida albicans NOT DETECTED NOT DETECTED Final   Candida auris NOT DETECTED NOT DETECTED Final   Candida glabrata NOT  DETECTED NOT DETECTED Final   Candida krusei NOT DETECTED NOT DETECTED Final   Candida parapsilosis NOT DETECTED NOT DETECTED Final   Candida tropicalis NOT DETECTED NOT DETECTED Final   Cryptococcus neoformans/gattii NOT DETECTED NOT DETECTED Final   CTX-M ESBL NOT DETECTED NOT DETECTED Final   Carbapenem resistance IMP NOT DETECTED NOT DETECTED Final   Carbapenem resistance KPC NOT DETECTED NOT DETECTED Final   Carbapenem resistance NDM NOT DETECTED NOT DETECTED Final   Carbapenem resist OXA 48 LIKE NOT DETECTED NOT DETECTED Final   Carbapenem resistance VIM NOT DETECTED NOT DETECTED Final    Comment: Performed at [facility], [address] [place], [place] [id number]     Labs:  COVID-19 Labs   Lab Results  Component Value  Date   SARSCOV2NAA NEGATIVE 03/03/2021   SARSCOV2NAA NEGATIVE 01/16/2021      Basic Metabolic Panel: Recent Labs  Lab 03/04/21 0533 03/04/21 2204 03/05/21 0032 03/06/21 0131 03/07/21 0213 03/07/21 0214 03/08/21 0030  NA  --  135 134* 137 135  --  131*  K  --  3.7 3.9 3.9 3.1*  --  4.6  CL  --  107 109 106 100  --  97*  CO2  --  19* 19* 26 27  --  24  GLUCOSE  --  170* 189* 117* 107*  --  90  BUN  --  11 13 11  5*  --  6  CREATININE  --  0.85 0.78 0.73 0.71  --  0.81  CALCIUM  --  7.9* 7.8* 7.9* 8.1*  --  8.3*  MG 1.2* 2.4 2.3  --   --  1.5* 2.2   Liver Function Tests: Recent Labs  Lab 03/03/21 2155 03/07/21 0213  AST 42* 22  ALT 45* 24  ALKPHOS 66 58  BILITOT 1.2 0.7  PROT 6.7 5.5*  ALBUMIN 3.2* 2.2*   Recent Labs  Lab 03/03/21 2155  LIPASE 27   CBC: Recent Labs  Lab 03/03/21 2155 03/05/21 0032 03/07/21 0213 03/08/21 0030  WBC 12.8* 11.6* 5.9 7.5  HGB 13.7 11.5* 11.5* 13.4  HCT 41.0 34.4* 34.8* 40.5  MCV 83.2 83.7 83.3 83.2  PLT 185 134* 181 212   CBG: Recent Labs  Lab 03/04/21 0539 03/04/21 2128  GLUCAP 84 173*     IMAGING STUDIES DG Chest 2 View  Result Date: 03/03/2021 CLINICAL DATA:  Chest pain EXAM: CHEST - 2 VIEW COMPARISON:  September 22, 2017 FINDINGS: The heart size and mediastinal contours are within normal limits. No focal consolidation. No pleural effusion. No pneumothorax. The visualized skeletal structures are unremarkable. IMPRESSION: No active cardiopulmonary disease. Electronically Signed   By: Maudry MayhewJeffrey  Waltz MD   On: 03/03/2021 22:56   DG Abdomen 1 View  Result Date: 03/04/2021 CLINICAL DATA:  Hydronephrosis with infection.  Postvoid view. EXAM: ABDOMEN - 1 VIEW COMPARISON:  Prior abdomen and CT 03/04/2021. FINDINGS: Contrast is again noted in the renal collecting system and bladder. The patient has voided. Previously identified calcific density over the right lower pelvis is now noted. This is at the level of the right  ureterovesical junction and a nonobstructing distal right ureteral stone cannot be excluded. Again a phlebolith could present in this fashion. Again no evidence of hydronephrosis or hydroureter. Lower tiny calcific density noted over the pelvis is again noted and consistent with a phlebolith. No bowel distention. No acute bony abnormality. IMPRESSION: Contrast is again noted the renal collecting system and bladder. The patient  has voided. Previously identified calcific density over the right lower pelvis is now noted. This is at the level the right ureterovesical junction and a nonobstructing distal right ureteral stone cannot be excluded. Again a phlebolith could present in this fashion. Again no evidence of hydronephrosis or hydroureter. Electronically Signed   By: Maisie Fus  Register   On: 03/04/2021 06:48   DG Abd 1 View  Result Date: 03/04/2021 CLINICAL DATA:  Hydronephrosis with infection. Possible right distal renal stone. EXAM: ABDOMEN - 1 VIEW COMPARISON:  CT 03/04/2021. FINDINGS: Contrast from prior CT is noted in the renal collecting systems bilaterally and in the bladder. No hydronephrosis or hydroureter noted. Previously identified calcific density in the right lower pelvis cannot be identified due to overlying contrast filled bladder. Nonspecific air-filled loops of small bowel noted. No prominent bowel distention. No free air. Mild lumbar spine scoliosis concave left. IMPRESSION: Contrast from prior CT is noted in the renal collecting systems bilaterally and in the bladder. No hydronephrosis or hydroureter noted. Previously identified calcific density right lower pelvis cannot be identified due to overlying contrast filled bladder. However again no evidence of hydronephrosis or hydroureter. Electronically Signed   By: Maisie Fus  Register   On: 03/04/2021 06:19   CT ANGIO CHEST PE W OR WO CONTRAST  Result Date: 03/05/2021 CLINICAL DATA:  25 year old female presenting with right side pyelonephritis,  possible superimposed distal obstructing ureteral stone. Status post retrograde pyelogram and right ureteral stent placement yesterday. EXAM: CT ANGIOGRAPHY CHEST WITH CONTRAST TECHNIQUE: Multidetector CT imaging of the chest was performed using the standard protocol during bolus administration of intravenous contrast. Multiplanar CT image reconstructions and MIPs were obtained to evaluate the vascular anatomy. CONTRAST:  80mL OMNIPAQUE IOHEXOL 300 MG/ML  SOLN COMPARISON:  CT Abdomen and Pelvis 03/04/2021. Chest radiographs 03/03/2021. FINDINGS: Cardiovascular: Excellent contrast bolus timing in the pulmonary arterial tree. No focal filling defect identified in the pulmonary arteries to suggest acute pulmonary embolism. No left heart or arterial contrast on these images. No cardiomegaly or pericardial effusion. Mediastinum/Nodes: Negative. Lungs/Pleura: Small new layering pleural effusion since yesterday. Major airways are patent. Lower lung volumes with perihilar and dependent atelectasis. More confluent posterior basal segment lower lobe peribronchial opacity, and mild septal thickening in both lungs. No consolidation. Upper Abdomen: Negative visible noncontrast upper abdomen. Musculoskeletal: Negative. Review of the MIP images confirms the above findings. IMPRESSION: 1. Negative for acute pulmonary embolus. 2. Small new layering pleural effusions since yesterday. Lower lung volumes with atelectasis, and possible mild interstitial edema. No convincing pneumonia at this time. Electronically Signed   By: Odessa Fleming M.D.   On: 03/05/2021 04:23   CT ABDOMEN PELVIS W CONTRAST  Result Date: 03/04/2021 CLINICAL DATA:  25 year old female with tachycardia for 2 days. Lightheaded, dizziness. Nausea vomiting. EXAM: CT ABDOMEN AND PELVIS WITH CONTRAST TECHNIQUE: Multidetector CT imaging of the abdomen and pelvis was performed using the standard protocol following bolus administration of intravenous contrast. CONTRAST:  80mL  OMNIPAQUE IOHEXOL 300 MG/ML  SOLN COMPARISON:  CT Abdomen and Pelvis 07/26/2017. FINDINGS: Lower chest: Minor atelectasis at the right lung base. Otherwise negative. No cardiomegaly or pericardial effusion. Hepatobiliary: Negative liver and gallbladder. Pancreas: Negative. Spleen: Negative. Adrenals/Urinary Tract: Normal adrenal glands. The left kidney appears stable since 2018 and normal. The proximal left ureter is within normal limits. The right kidney is abnormal. The right kidney is heterogeneously enhancing and inflamed, with confluent areas of cortical hypoenhancement in the mid pole and lower pole (coronal image 45). Associated soft tissue  stranding in the right pararenal space. There is superimposed right renal collecting system and right ureter urothelial thickening and enhancement. There is right periureteral stranding proximally. The distal ureter is difficult to delineate. And there is a new 4 mm calculus in the right hemipelvis since 2018 (series 3, image 75 and coronal image 38. However, there is not significant right hydronephrosis. Otherwise unremarkable bladder. And a new midline pelvic phlebolith is noted on series 3, image 78. Stomach/Bowel: Nondilated large and small bowel. Redundant transverse colon. Negative terminal ileum. Diminutive or absent appendix with no inflammation at the tip of the cecum. Stomach and duodenum are within normal limits. No free air, free fluid. Vascular/Lymphatic: Major arterial structures are patent and appear normal. The central venous structures of the abdomen, pelvis, and portal venous system also appear patent. No lymphadenopathy. Reproductive: Negative. Other: No pelvic free fluid. Musculoskeletal: Negative. IMPRESSION: 1. Positive for Acute Right Pyelonephritis. Questionable superimposed component of obstructive uropathy, but a new 4 mm calculus in the right hemipelvis is favored to be a phlebolith rather than a distal ureteral stone given the absence of overt  right hydronephrosis. No pararenal abscess. 2. Otherwise negative CT Abdomen and Pelvis. Electronically Signed   By: Odessa Fleming M.D.   On: 03/04/2021 04:28   DG Retrograde Pyelogram  Result Date: 03/05/2021 CLINICAL DATA:  Cystoscopy Right stent placement Right ureteral calculus EXAM: RETROGRADE PYELOGRAM COMPARISON:  CT abdomen pelvis 03/04/2021 FINDINGS: Five intraoperative images were submitted for interpretation. Multiple small filling defects seen in the distal ureter on the second and third images likely due to calculi. The last 2 images demonstrate the proximal and distal ends of the ureteral stent. IMPRESSION: Intraoperative fluoroscopic images of right retrograde pyelogram as above. Electronically Signed   By: Acquanetta Belling M.D.   On: 03/05/2021 08:01   ECHOCARDIOGRAM COMPLETE  Result Date: 03/05/2021    ECHOCARDIOGRAM REPORT   Patient Name:   Laurie Richards Date of Exam: 03/05/2021 Medical Rec #:  [id number]      Height:       64.0 in Accession #:    [id number]     Weight:       133.5 lb Date of Birth:  1996-06-10     BSA:          1.648 m Patient Age:    24 years       BP:           100/76 mmHg Patient Gender: F              HR:           145 bpm. Exam Location:  Inpatient Procedure: 2D Echo, Cardiac Doppler and Color Doppler Indications:    Atrial fibrillation  History:        Patient has prior history of Echocardiogram examinations, most                 recent 09/23/2017. Arrythmias:Atrial Fibrillation.  Sonographer:    Neomia Dear RDCS Referring Phys: [id number] Deno Lunger [facility]  Sonographer Comments: No cardiac surgery noted in chart IMPRESSIONS  1. Difficult to assess LV function as in Afib with RVR to 140s during study. Left ventricular ejection fraction, by estimation, is 40 to 45%. The left ventricle has mildly decreased function. The left ventricle demonstrates global hypokinesis. There is mild left ventricular hypertrophy. Left ventricular diastolic parameters are indeterminate.  2. Right  ventricular systolic function is normal. The right ventricular size is normal. There is normal pulmonary artery  systolic pressure. The estimated right ventricular systolic pressure is 29.0 mmHg.  3. The mitral valve is normal in structure. Mild mitral valve regurgitation.  4. The aortic valve is tricuspid. Aortic valve regurgitation is not visualized. No aortic stenosis is present.  5. The inferior vena cava is dilated in size with <50% respiratory variability, suggesting right atrial pressure of 15 mmHg. FINDINGS  Left Ventricle: Left ventricular ejection fraction, by estimation, is 40 to 45%. The left ventricle has mildly decreased function. The left ventricle demonstrates global hypokinesis. The left ventricular internal cavity size was normal in size. There is  mild left ventricular hypertrophy. Left ventricular diastolic parameters are indeterminate. Right Ventricle: The right ventricular size is normal. No increase in right ventricular wall thickness. Right ventricular systolic function is normal. There is normal pulmonary artery systolic pressure. The tricuspid regurgitant velocity is 1.87 m/s, and  with an assumed right atrial pressure of 15 mmHg, the estimated right ventricular systolic pressure is 29.0 mmHg. Left Atrium: Left atrial size was normal in size. Right Atrium: Right atrial size was normal in size. Pericardium: There is no evidence of pericardial effusion. Mitral Valve: The mitral valve is normal in structure. Mild mitral valve regurgitation. Tricuspid Valve: The tricuspid valve is normal in structure. Tricuspid valve regurgitation is mild. Aortic Valve: The aortic valve is tricuspid. Aortic valve regurgitation is not visualized. No aortic stenosis is present. Aortic valve mean gradient measures 3.0 mmHg. Aortic valve peak gradient measures 6.0 mmHg. Aortic valve area, by VTI measures 3.07 cm. Pulmonic Valve: The pulmonic valve was not well visualized. Pulmonic valve regurgitation is not  visualized. Aorta: The aortic root is normal in size and structure. Venous: The inferior vena cava is dilated in size with less than 50% respiratory variability, suggesting right atrial pressure of 15 mmHg. IAS/Shunts: The interatrial septum was not well visualized.  LEFT VENTRICLE PLAX 2D LVIDd:         4.30 cm LVIDs:         3.80 cm LV PW:         1.00 cm LV IVS:        1.10 cm LVOT diam:     2.00 cm LV SV:         52 LV SV Index:   31 LVOT Area:     3.14 cm  LV Volumes (MOD) LV vol d, MOD A2C: 48.4 ml LV vol d, MOD A4C: 50.5 ml LV vol s, MOD A2C: 33.0 ml LV vol s, MOD A4C: 26.6 ml LV SV MOD A2C:     15.4 ml LV SV MOD A4C:     50.5 ml LV SV MOD BP:      17.7 ml RIGHT VENTRICLE RV S prime:     10.95 cm/s RVOT diam:      3.00 cm LEFT ATRIUM             Index       RIGHT ATRIUM           Index LA diam:        3.50 cm 2.12 cm/m  RA Area:     12.10 cm LA Vol (A2C):   45.1 ml 27.35 ml/m RA Volume:   28.80 ml  17.48 ml/m LA Vol (A4C):   32.0 ml 19.42 ml/m LA Biplane Vol: 31.2 ml 18.94 ml/m  AORTIC VALVE AV Area (Vmax):    2.63 cm AV Area (Vmean):   2.65 cm AV Area (VTI):     3.07 cm  AV Vmax:           122.00 cm/s PULMONARY ARTERY AV Vmean:          78.600 cm/s MPA diam:        2.60 cm AV VTI:            0.169 m AV Peak Grad:      6.0 mmHg AV Mean Grad:      3.0 mmHg LVOT Vmax:         102.00 cm/s LVOT Vmean:        66.300 cm/s LVOT VTI:          0.165 m LVOT/AV VTI ratio: 0.98  AORTA Ao Root diam: 2.40 cm Ao Asc diam:  2.10 cm MITRAL VALVE               TRICUSPID VALVE MV Area (PHT): 13.55 cm   TR Peak grad:   14.0 mmHg MV Decel Time: 56 msec     TR Vmax:        187.00 cm/s MV E velocity: 87.80 cm/s                            SHUNTS                            Systemic VTI:  0.16 m                            Systemic Diam: 2.00 cm                            Pulmonic Diam: 3.00 cm Epifanio Lesches MD Electronically signed by Epifanio Lesches MD Signature Date/Time: 03/05/2021/1:21:38 PM    Final      DISCHARGE EXAMINATION: Vitals:   03/08/21 1903 03/08/21 2316 03/09/21 0308 03/09/21 0726  BP: 95/63 (!) 90/54 93/60 92/62   Pulse: 96 87  87  Resp: 20 17 14 16   Temp: 98.5 F (36.9 C) 98.3 F (36.8 C) 97.7 F (36.5 C) 98.5 F (36.9 C)  TempSrc: Oral Oral Oral Oral  SpO2: 96% 95% 98% 96%  Weight:      Height:       General appearance: Awake alert.  In no distress Resp: Clear to auscultation bilaterally.  Normal effort Cardio: S1-S2 is normal regular.  No S3-S4.  No rubs murmurs or bruit GI: Abdomen is soft.  Nontender nondistended.  Bowel sounds are present normal.  No masses organomegaly    DISPOSITION: Home  Discharge Instructions    Call MD for:  difficulty breathing, headache or visual disturbances   Complete by: As directed    Call MD for:  extreme fatigue   Complete by: As directed    Call MD for:  hives   Complete by: As directed    Call MD for:  persistant dizziness or light-headedness   Complete by: As directed    Call MD for:  persistant nausea and vomiting   Complete by: As directed    Call MD for:  severe uncontrolled pain   Complete by: As directed    Call MD for:  temperature >100.4   Complete by: As directed    Diet - low sodium heart healthy   Complete by: As directed    Discharge instructions   Complete by: As directed  Please take your medications as prescribed.  Cardiology and urology office will call you for appointments.  If you do not hear from them by midweek please call their offices.  Seek attention if you notice that your heart rate is worse if you feel dizzy lightheaded or if you have any passing out spells.  Also seek attention if you develop fever chills abdominal pain nausea or vomiting.  You were cared for by a hospitalist during your hospital stay. If you have any questions about your discharge medications or the care you received while you were in the hospital after you are discharged, you can call the unit and asked to speak with  the hospitalist on call if the hospitalist that took care of you is not available. Once you are discharged, your primary care physician will handle any further medical issues. Please note that NO REFILLS for any discharge medications will be authorized once you are discharged, as it is imperative that you return to your primary care physician (or establish a relationship with a primary care physician if you do not have one) for your aftercare needs so that they can reassess your need for medications and monitor your lab values. If you do not have a primary care physician, you can call [phone] for a physician referral.   Increase activity slowly   Complete by: As directed    No wound care   Complete by: As directed        Allergies as of 03/09/2021      Reactions   Acetaminophen-codeine Hives, Itching, Nausea And Vomiting, Swelling, Rash, Other (See Comments)   Mother had an anaphylactic reaction (patient stated)- And, vomiting if taken on empty stomach   Codeine Hives, Itching, Nausea And Vomiting, Swelling, Rash, Other (See Comments)   Mother had an anaphylactic reaction (patient stated)   Latex Rash, Other (See Comments)   Soreness, also      Medication List    STOP taking these medications   amoxicillin-clavulanate 875-125 MG tablet Commonly known as: AUGMENTIN   meloxicam 15 MG tablet Commonly known as: MOBIC   naproxen 375 MG tablet Commonly known as: NAPROSYN   naproxen sodium 220 MG tablet Commonly known as: ALEVE   pseudoephedrine 60 MG tablet Commonly known as: SUDAFED     TAKE these medications   acetaminophen 325 MG tablet Commonly known as: TYLENOL Take 650 mg by mouth every 6 (six) hours as needed.   cephALEXin 500 MG capsule Commonly known as: KEFLEX Take 1 capsule (500 mg total) by mouth 4 (four) times daily for 5 days.   cetirizine 10 MG tablet Commonly known as: ZyrTEC Allergy Take 1 tablet (10 mg total) by mouth daily. What changed: when to take  this   cyclobenzaprine 10 MG tablet Commonly known as: FLEXERIL Take 0.5 tablets (5 mg total) by mouth 3 (three) times daily as needed for up to 20 days for muscle spasms.   digoxin 0.125 MG tablet Commonly known as: LANOXIN Take 1 tablet (0.125 mg total) by mouth daily.   famotidine 20 MG tablet Commonly known as: PEPCID Take 1 tablet (20 mg total) by mouth 2 (two) times daily for 14 days.   ibuprofen 600 MG tablet Commonly known as: ADVIL Take 600 mg by mouth every 6 (six) hours as needed (for pain).   metoprolol tartrate 25 MG tablet Commonly known as: LOPRESSOR Take 0.5 tablets (12.5 mg total) by mouth 2 (two) times daily.   polyethylene glycol 17 g packet Commonly  known as: MIRALAX / GLYCOLAX Take 17 g by mouth daily.   senna 8.6 MG Tabs tablet Commonly known as: SENOKOT Take 2 tablets (17.2 mg total) by mouth daily.   traMADol 50 MG tablet Commonly known as: ULTRAM Take 1 tablet (50 mg total) by mouth every 6 (six) hours as needed for moderate pain.         Follow-up Information    Jake Bathe, MD Follow up in 1 week(s).   Specialty: Cardiology Why: please call his office for an appointment if you dont hear anything in the next few days Contact information: [address] [place] [address] [phone]        Sebastian Ache, MD Follow up.   Specialty: Urology Why: his office will call to arrange f/u Contact information: [address] [place] [address] [phone]               TOTAL DISCHARGE TIME: 35 minutes  Kessie Croston Rito Ehrlich  [facility] Pager on [email or web address]  03/10/2021, 12:10 PM

## 2021-03-09 NOTE — Plan of Care (Signed)
  Problem: Urinary Elimination: Goal: Signs and symptoms of infection will decrease Outcome: Progressing   Problem: Education: Goal: Knowledge of disease or condition will improve Outcome: Progressing Goal: Understanding of medication regimen will improve Outcome: Progressing Goal: Individualized Educational Video(s) Outcome: Progressing   Problem: Activity: Goal: Ability to tolerate increased activity will improve Outcome: Progressing   Problem: Cardiac: Goal: Ability to achieve and maintain adequate cardiopulmonary perfusion will improve Outcome: Progressing   Problem: Health Behavior/Discharge Planning: Goal: Ability to safely manage health-related needs after discharge will improve Outcome: Progressing   Problem: Education: Goal: Knowledge of General Education information will improve Description: Including pain rating scale, medication(s)/side effects and non-pharmacologic comfort measures Outcome: Progressing   Problem: Health Behavior/Discharge Planning: Goal: Ability to manage health-related needs will improve Outcome: Progressing   Problem: Clinical Measurements: Goal: Ability to maintain clinical measurements within normal limits will improve Outcome: Progressing Goal: Will remain free from infection Outcome: Progressing Goal: Diagnostic test results will improve Outcome: Progressing Goal: Respiratory complications will improve Outcome: Progressing Goal: Cardiovascular complication will be avoided Outcome: Progressing   Problem: Nutrition: Goal: Adequate nutrition will be maintained Outcome: Progressing   Problem: Coping: Goal: Level of anxiety will decrease Outcome: Progressing   Problem: Elimination: Goal: Will not experience complications related to bowel motility Outcome: Progressing Goal: Will not experience complications related to urinary retention Outcome: Progressing   Problem: Pain Managment: Goal: General experience of comfort will  improve Outcome: Progressing   Problem: Safety: Goal: Ability to remain free from injury will improve Outcome: Progressing

## 2021-03-09 NOTE — Discharge Instructions (Signed)
Pyelonephritis, Adult    Pyelonephritis is an infection that occurs in the kidney. The kidneys are organs that help clean the blood by moving waste out of the blood and into the pee (urine). This infection can happen quickly, or it can last for a long time. In most cases, it clears up with treatment and does not cause other problems.  What are the causes?  This condition may be caused by:  · Germs (bacteria) going from the bladder up to the kidney. This may happen after having a bladder infection.  · Germs going from the blood to the kidney.  What increases the risk?  This condition is more likely to develop in:  · Pregnant women.  · Older people.  · People who have any of these conditions:  ? Diabetes.  ? Inflammation of the prostate gland (prostatitis), in males.  ? Kidney stones or bladder stones.  ? Other problems with the kidney or the parts of your body that carry pee from the kidneys to the bladder (ureters).  ? Cancer.  · People who have a small, thin tube (catheter) placed in the bladder.  · People who are sexually active.  · Women who use a medicine that kills sperm (spermicide) to prevent pregnancy.  · People who have had a prior urinary tract infection (UTI).  What are the signs or symptoms?  Symptoms of this condition include:  · Peeing often.  · A strong urge to pee right away.  · Burning or stinging when peeing.  · Belly pain.  · Back pain.  · Pain in the side (flank area).  · Fever or chills.  · Blood in the pee, or dark pee.  · Feeling sick to your stomach (nauseous) or throwing up (vomiting).  How is this treated?  This condition may be treated by:  · Taking antibiotic medicines by mouth (orally).  · Drinking enough fluids.  If the infection is bad, you may need to stay in the hospital. You may be given antibiotics and fluids that are put directly into a vein through an IV tube.  In some cases, other treatments may be needed.  Follow these instructions at home:  Medicines  · Take your antibiotic  medicine as told by your doctor. Do not stop taking the antibiotic even if you start to feel better.  · Take over-the-counter and prescription medicines only as told by your doctor.  General instructions    · Drink enough fluid to keep your pee pale yellow.  · Avoid caffeine, tea, and carbonated drinks.  · Pee (urinate) often. Avoid holding in pee for long periods of time.  · Pee before and after sex.  · After pooping (having a bowel movement), women should wipe from front to back. Use each tissue only once.  · Keep all follow-up visits as told by your doctor. This is important.  Contact a doctor if:  · You do not feel better after 2 days.  · Your symptoms get worse.  · You have a fever.  Get help right away if:  · You cannot take your medicine or drink fluids as told.  · You have chills and shaking.  · You throw up.  · You have very bad pain in your side or back.  · You feel very weak or you pass out (faint).  Summary  · Pyelonephritis is an infection that occurs in the kidney.  · In most cases, this infection clears up with treatment and does   with your health care provider. Document Revised: 08/30/2018 Document Reviewed: 08/30/2018 Elsevier Patient Education  2021 [employer]   Ureteral Stent Implantation, Care After This sheet gives you information about how to care for yourself after your procedure. Your health care provider may also give you more specific instructions. If you have problems or questions, contact your health care provider. What can I expect after the procedure? After the procedure, it is common to have:  Nausea.  Mild pain when you urinate. You  may feel this pain in your lower back or lower abdomen. The pain should stop within a few minutes after you urinate. This may last for up to 1 week.  A small amount of blood in your urine for several days. Follow these instructions at home: Medicines  Take over-the-counter and prescription medicines only as told by your health care provider.  If you were prescribed an antibiotic medicine, take it as told by your health care provider. Do not stop taking the antibiotic even if you start to feel better.  Do not drive for 24 hours if you were given a sedative during your procedure.  Ask your health care provider if the medicine prescribed to you requires you to avoid driving or using heavy machinery. Activity  Rest as told by your health care provider.  Avoid sitting for a long time without moving. Get up to take short walks every 1-2 hours. This is important to improve blood flow and breathing. Ask for help if you feel weak or unsteady.  Return to your normal activities as told by your health care provider. Ask your health care provider what activities are safe for you. General instructions  Watch for any blood in your urine. Call your health care provider if the amount of blood in your urine increases.  If you have a catheter: ? Follow instructions from your health care provider about taking care of your catheter and collection bag. ? Do not take baths, swim, or use a hot tub until your health care provider approves. Ask your health care provider if you may take showers. You may only be allowed to take sponge baths.  Drink enough fluid to keep your urine pale yellow.  Do not use any products that contain nicotine or tobacco, such as cigarettes, e-cigarettes, and chewing tobacco. These can delay healing after surgery. If you need help quitting, ask your health care provider.  Keep all follow-up visits as told by your health care provider. This is important.   Contact a health care  provider if:  You have pain that gets worse or does not get better with medicine, especially pain when you urinate.  You have difficulty urinating.  You feel nauseous or you vomit repeatedly during a period of more than 2 days after the procedure. Get help right away if:  Your urine is dark red or has blood clots in it.  You are leaking urine (have incontinence).  The end of the stent comes out of your urethra.  You cannot urinate.  You have sudden, sharp, or severe pain in your abdomen or lower back.  You have a fever.  You have swelling or pain in your legs.  You have difficulty breathing. Summary  After the procedure, it is common to have mild pain when you urinate that goes away within a few minutes after you urinate. This may last for up to 1 week.  Watch for any blood in your urine. Call your health care provider if the amount  of blood in your urine increases.  Take over-the-counter and prescription medicines only as told by your health care provider.  Drink enough fluid to keep your urine pale yellow. This information is not intended to replace advice given to you by your health care provider. Make sure you discuss any questions you have with your health care provider. Document Revised: 08/02/2018 Document Reviewed: 08/03/2018 Elsevier Patient Education  2021 [employer].

## 2021-03-09 NOTE — TOC Transition Note (Signed)
Transition of Care (TOC) - CM/SW Discharge Note Donn Pierini RN, BSN Transitions of Care Unit 4E- RN Case Manager See Treatment Team for direct phone #    Patient Details  Name: Laurie Richards MRN: [id number] Date of Birth: 1996-05-16  Transition [facility] Care [facility]) CM/SW Contact:  Darrold Span, RN Phone Number: 03/09/2021, 9:43 AM   Clinical Narrative:    Pt stable for transition home today, needs medication assistance. Pt is eligible for MATCH assistance- TOC closed on the weekends, pt provided [facility] letter and list of pharmacies that participate in program that she can use, MD to provide printed scripts for her to take with her to use along with MATCH at pharmacy. Program explained to pt with $3 copay cost per script and no coverage for any pain meds or controlled substances. Pt voiced understanding.    Final next level of care: Home/Self Care Barriers to Discharge: No Barriers Identified   Patient Goals and CMS Choice     Choice offered to / list presented to : NA  Discharge Placement                 Home      Discharge Plan and Services   Discharge Planning Services: CM Consult,Medication [facility] Program Post Acute Care Choice: NA          DME Arranged: N/A DME Agency: NA       HH Arranged: NA HH Agency: NA        Social Determinants of Health (SDOH) Interventions     Readmission Risk Interventions Readmission Risk Prevention Plan 03/09/2021  Post Dischage Appt Complete  Medication Screening Complete  Transportation Screening Complete  Some recent data might be hidden

## 2021-03-10 ENCOUNTER — Other Ambulatory Visit (HOSPITAL_COMMUNITY): Payer: Self-pay

## 2021-03-10 SURGERY — ECHOCARDIOGRAM, TRANSESOPHAGEAL
Anesthesia: General

## 2021-03-11 ENCOUNTER — Telehealth: Payer: Self-pay | Admitting: Cardiology

## 2021-03-11 NOTE — Telephone Encounter (Signed)
**Note De-Identified Laurie Richards Obfuscation** Patient contacted regarding discharge from [facility] on 03/09/2021.  Patient understands to follow up with provider Laurie Newcomer, PA-c on 03/14/2021 at 10:45 at [address] [place] in [place], [place]. Patient understands discharge instructions? Yes Patient understands medications and regiment? Yes Patient understands to bring all medications to this visit? Yes  Ask patient:  Are you enrolled in My Chart: Yes  The pt reports that she is doing well and is without any c/o CP/discomfort, SOB, nausea, diaphoresis, weight gain, swelling, dizziness, or lightheadedness.  She does have [facility]'s phone number and is aware to call if she has any questions or concerns. She thanked me for calling her.

## 2021-03-11 NOTE — Telephone Encounter (Signed)
TOC on 03/14/21 at 10:45am with Tereso Newcomer per staff message from [facility]. Pt was discharged 04/30

## 2021-03-13 ENCOUNTER — Other Ambulatory Visit: Payer: Self-pay | Admitting: Urology

## 2021-03-13 NOTE — Progress Notes (Signed)
Cardiology Office Note:    Date:  03/14/2021   ID:  Laurie Richards, DOB [birth year], MRN [id number]  PCP:  Patient, No Pcp Per (Inactive)   [facility] Providers Cardiologist:  Donato Schultz, MD     Referring MD: No ref. provider found   Chief Complaint:  Hospitalization Follow-up (AFib w RVR in setting of pyelonephritis, obstructive urolithiasis)    Patient Profile:    Laurie Richards is a 25 y.o. female with:   Paroxysmal atrial fibrillation (Dx in 2018)  No anticoagulation; initial episode in 2018 in setting of acute illness (tooth abscess)  AFib w RVR iso Pyelonephritis/obst urolithiasis >> conv on rate ctl Rx  Cardiomyopathy  EF 40-45 during AF with RVR - likely tachy mediated  Hyperthyroidism   +Cigs  Hx of IV drug use  FHx of CAD  Prior CV studies: Echocardiogram 03/05/21 (A. fib with RVR)-EF 40-45, mild LVH, normal RVSF, RVSP 29, mild MR  Chest CTA 03/05/21 IMPRESSION: 1. Negative for acute pulmonary embolus. 2. Small new layering pleural effusions since yesterday. Lower lung volumes with atelectasis, and possible mild interstitial edema. No convincing pneumonia at this time.  Echocardiogram 09/23/17 EF 60-65, no RWMA, normal RVSF  History of Present Illness: Ms. Amini was admitted 4/25-5/2 with E.coli sepsis secondary to acute pyelonephritis in the setting of obstructive urolithiasis.  She developed AFib with RVR and was seen by Cardiology. Her hs-Troponins were elevated c/w demand ischemia.  Her EF was down on echocardiogram at 40-45 but this was felt to be related to tachycardia.  Rate control was difficult and limited by hypotension.  She was placed on anticoagulation with plans for TEE-DCCV but she converted to NSR on her own.  She returns for f/u.  She is here alone. She has an Apple watch.  She has not had any high heart rates noted on her watch. She has not had palpitations, shortness of breath, chest pain.  She has not had orthopnea, leg edema.   She has a repeat urologic procedure 03/26/21.      Past Medical History:  Diagnosis Date  . Asthma   . Irregular heartbeat    a. occasional skipped beats since age 76.  . Marijuana abuse    a. smokes 1-2 x/wk.  Marland Kitchen PAF (paroxysmal atrial fibrillation) (HCC)    a. Dx 09/2017 in setting of mild hyperthyroidism.  . Tobacco abuse    a. smoking cigarettes since age 35.  . Tooth abscess 09/2017    Current Medications: Current Meds  Medication Sig  . acetaminophen (TYLENOL) 325 MG tablet Take 650 mg by mouth every 6 (six) hours as needed.  . cephALEXin (KEFLEX) 500 MG capsule Take 1 capsule (500 mg total) by mouth 4 (four) times daily for 5 days.  . cetirizine (ZYRTEC ALLERGY) 10 MG tablet Take 1 tablet (10 mg total) by mouth daily.  . cyclobenzaprine (FLEXERIL) 10 MG tablet Take 0.5 tablets (5 mg total) by mouth 3 (three) times daily as needed for up to 20 days for muscle spasms.  . famotidine (PEPCID) 20 MG tablet Take 1 tablet (20 mg total) by mouth 2 (two) times daily for 14 days.  . polyethylene glycol (MIRALAX / GLYCOLAX) 17 g packet Take 17 g by mouth as needed for mild constipation or moderate constipation.  . senna (SENOKOT) 8.6 MG TABS tablet Take 2 tablets (17.2 mg total) by mouth daily.  . traMADol (ULTRAM) 50 MG tablet Take 1 tablet (50 mg total) by mouth every  6 (six) hours as needed for moderate pain.  . [DISCONTINUED] digoxin (LANOXIN) 0.125 MG tablet Take 1 tablet (0.125 mg total) by mouth daily.  . [DISCONTINUED] metoprolol succinate (TOPROL-XL) 25 MG 24 hr tablet Take 25 mg by mouth 2 (two) times daily.     Allergies:   Acetaminophen-codeine, Codeine, and Latex   Social History   Tobacco Use  . Smoking status: Former Smoker    Packs/day: 0.50    Types: Cigarettes  . Smokeless tobacco: Never Used  Vaping Use  . Vaping Use: Never used  Substance Use Topics  . Alcohol use: Not Currently  . Drug use: Not Currently     Family Hx: The patient's family history  includes Coronary artery disease in her maternal uncle, maternal uncle, and mother; Diabetes in an other family member; Heart failure in her mother.  Review of Systems  Gastrointestinal: Negative for hematochezia and melena.     EKGs/Labs/Other Test Reviewed:    EKG:  EKG is   ordered today.  The ekg ordered today demonstrates NSR, HR 79, normal axis, QTc 410 ms, no ST-TW changes.   Recent Labs: 03/04/2021: TSH 0.736 03/07/2021: ALT 24 03/08/2021: BUN 6; Creatinine, Ser 0.81; Hemoglobin 13.4; Magnesium 2.2; Platelets 212; Potassium 4.6; Sodium 131   Recent Lipid Panel No results found for: CHOL, TRIG, HDL, CHOLHDL, LDLCALC, LDLDIRECT    Risk Assessment/Calculations:    CHA2DS2-VASc Score = 1  This indicates a 0.6% annual risk of stroke. The patient's score is based upon: CHF History: No HTN History: No Diabetes History: No Stroke History: No Vascular Disease History: No Age Score: 0 Gender Score: 1      Physical Exam:    VS:  BP (!) 82/50   Pulse 79   Ht 5\' 4"  (1.626 m)   Wt 125 lb 3.2 oz (56.8 kg)   LMP 02/24/2021   SpO2 98%   BMI 21.49 kg/m     Wt Readings from Last 3 Encounters:  03/14/21 125 lb 3.2 oz (56.8 kg)  03/04/21 133 lb 8 oz (60.6 kg)  08/10/18 116 lb 12 oz (53 kg)     Constitutional:      Appearance: Healthy appearance. Not in distress.  Neck:     Vascular: JVD normal.  Pulmonary:     Effort: Pulmonary effort is normal.     Breath sounds: No wheezing. No rales.  Cardiovascular:     Normal rate. Regular rhythm. Normal S1. Normal S2.     Murmurs: There is no murmur.  Edema:    Peripheral edema absent.  Abdominal:     Palpations: Abdomen is soft. There is no hepatomegaly.  Skin:    General: Skin is warm and dry.  Neurological:     Mental Status: Alert and oriented to person, place and time.     Cranial Nerves: Cranial nerves are intact.         ASSESSMENT & PLAN:    1. Paroxysmal atrial fibrillation (HCC) Maintaining normal sinus  rhythm.  She has recovered from her infection.  She has had episodes of atrial fibrillation in the setting of acute illness in the past.  She should do well and remain in normal sinus rhythm. If she has recurrent atrial fibrillation without an inciting event, we can get her back to see Dr. 10/10/18 (he saw her in the hospital) to discuss +/- PVI ablation.  I reviewed her case with Dr. Elberta Fortis regarding her Digoxin.  She can stop this now.  Her BP  is typically in the 90s.  I rechecked it today and it is 90/56.    -DC Digoxn  -Reduce Metoprolol succinate 25 mg once daily   2. Other cardiomyopathy (HCC) Likely tachycardia mediated.  Dr. Anne Fu personally looked at her echocardiogram and thought her EF was actually better than 40-45.  I will arrange repeat limited echocardiogram in ~ 4 weeks to recheck her EF.       Dispo:  Return in about 3 months (around 06/14/2021) for Routine follow up for 3 months with Dr.Skains. .   Medication Adjustments/Labs and Tests Ordered: Current medicines are reviewed at length with the patient today.  Concerns regarding medicines are outlined above.  Tests Ordered: Orders Placed This Encounter  Procedures  . EKG 12-Lead  . ECHOCARDIOGRAM LIMITED   Medication Changes: Meds ordered this encounter  Medications  . metoprolol succinate (TOPROL-XL) 25 MG 24 hr tablet    Sig: Take 1 tablet (25 mg total) by mouth daily.    Dispense:  90 tablet    Refill:  3    Signed, Tereso Newcomer, PA-C  03/14/2021 12:00 PM    [facility] [address] [place], [place], [place]  [address] Phone: [phone]; Fax: [phone]

## 2021-03-14 ENCOUNTER — Encounter: Payer: Self-pay | Admitting: Physician Assistant

## 2021-03-14 ENCOUNTER — Ambulatory Visit (INDEPENDENT_AMBULATORY_CARE_PROVIDER_SITE_OTHER): Payer: Self-pay | Admitting: Physician Assistant

## 2021-03-14 ENCOUNTER — Other Ambulatory Visit: Payer: Self-pay

## 2021-03-14 VITALS — BP 82/50 | HR 79 | Ht 64.0 in | Wt 125.2 lb

## 2021-03-14 DIAGNOSIS — I428 Other cardiomyopathies: Secondary | ICD-10-CM

## 2021-03-14 DIAGNOSIS — I480 Paroxysmal atrial fibrillation: Secondary | ICD-10-CM

## 2021-03-14 DIAGNOSIS — I5020 Unspecified systolic (congestive) heart failure: Secondary | ICD-10-CM

## 2021-03-14 MED ORDER — METOPROLOL SUCCINATE ER 25 MG PO TB24: 25.0000 mg | ORAL_TABLET | Freq: Every day | ORAL | 3 refills | Status: DC

## 2021-03-14 NOTE — Patient Instructions (Addendum)
Medication Instructions:  Your physician has recommended you make the following change in your medication:   1.  Discontinue Digoxin  2.  Decrease Toprol XL one tablet by mouth ( 25 mg) daily.    *If you need a refill on your cardiac medications before your next appointment, please call your pharmacy*   Lab Work: -None  If you have labs (blood work) drawn today and your tests are completely normal, you will receive your results only by: Marland Kitchen MyChart Message (if you have MyChart) OR . A paper copy in the mail If you have any lab test that is abnormal or we need to change your treatment, we will call you to review the results.   Testing/Procedures: Your physician has requested that you have an echocardiogram.Wednesday, June 1 @ 7:30 am  Echocardiography is a painless test that uses sound waves to create images of your heart. It provides your doctor with information about the size and shape of your heart and how well your heart's chambers and valves are working. This procedure takes approximately one hour. There are no restrictions for this procedure.   Echocardiogram An echocardiogram is a test that uses sound waves (ultrasound) to produce images of the heart. Images from an echocardiogram can provide important information about:  Heart size and shape.  The size and thickness and movement of your heart's walls.  Heart muscle function and strength.  Heart valve function or if you have stenosis. Stenosis is when the heart valves are too narrow.  If blood is flowing backward through the heart valves (regurgitation).  A tumor or infectious growth around the heart valves.  Areas of heart muscle that are not working well because of poor blood flow or injury from a heart attack.  Aneurysm detection. An aneurysm is a weak or damaged part of an artery wall. The wall bulges out from the normal force of blood pumping through the body. Tell a health care provider about:  Any allergies you  have.  All medicines you are taking, including vitamins, herbs, eye drops, creams, and over-the-counter medicines.  Any blood disorders you have.  Any surgeries you have had.  Any medical conditions you have.  Whether you are pregnant or may be pregnant. What are the risks? Generally, this is a safe test. However, problems may occur, including an allergic reaction to dye (contrast) that may be used during the test. What happens before the test? No specific preparation is needed. You may eat and drink normally. What happens during the test?  You will take off your clothes from the waist up and put on a hospital gown.  Electrodes or electrocardiogram (ECG)patches may be placed on your chest. The electrodes or patches are then connected to a device that monitors your heart rate and rhythm.  You will lie down on a table for an ultrasound exam. A gel will be applied to your chest to help sound waves pass through your skin.  A handheld device, called a transducer, will be pressed against your chest and moved over your heart. The transducer produces sound waves that travel to your heart and bounce back (or "echo" back) to the transducer. These sound waves will be captured in real-time and changed into images of your heart that can be viewed on a video monitor. The images will be recorded on a computer and reviewed by your health care provider.  You may be asked to change positions or hold your breath for a short time. This makes it  easier to get different views or better views of your heart.  In some cases, you may receive contrast through an IV in one of your veins. This can improve the quality of the pictures from your heart. The procedure may vary among health care providers and hospitals.   What can I expect after the test? You may return to your normal, everyday life, including diet, activities, and medicines, unless your health care provider tells you not to do that. Follow these  instructions at home:  It is up to you to get the results of your test. Ask your health care provider, or the department that is doing the test, when your results will be ready.  Keep all follow-up visits. This is important. Summary  An echocardiogram is a test that uses sound waves (ultrasound) to produce images of the heart.  Images from an echocardiogram can provide important information about the size and shape of your heart, heart muscle function, heart valve function, and other possible heart problems.  You do not need to do anything to prepare before this test. You may eat and drink normally.  After the echocardiogram is completed, you may return to your normal, everyday life, unless your health care provider tells you not to do that. This information is not intended to replace advice given to you by your health care provider. Make sure you discuss any questions you have with your health care provider. Document Revised: 06/18/2020 Document Reviewed: 06/18/2020 Elsevier Patient Education  2021 Elsevier Inc.    Follow-Up: At [facility], you and your health needs are our priority.  As part of our continuing mission to provide you with exceptional heart care, we have created designated Provider Care Teams.  These Care Teams include your primary Cardiologist (physician) and Advanced Practice Providers (APPs -  Physician Assistants and Nurse Practitioners) who all work together to provide you with the care you need, when you need it.  We recommend signing up for the patient portal called "MyChart".  Sign up information is provided on this After Visit Summary.  MyChart is used to connect with patients for Virtual Visits (Telemedicine).  Patients are able to view lab/test results, encounter notes, upcoming appointments, etc.  Non-urgent messages can be sent to your provider as well.   To learn more about what you can do with MyChart, go to [email or web address].    Your next  appointment:   3 month(s) Friday, August 5 @ 10:40 am  The format for your next appointment:   In Person  Provider:   Donato Schultz, MD   Other Instructions -None

## 2021-03-20 ENCOUNTER — Other Ambulatory Visit: Payer: Self-pay

## 2021-03-20 ENCOUNTER — Encounter (HOSPITAL_BASED_OUTPATIENT_CLINIC_OR_DEPARTMENT_OTHER): Payer: Self-pay | Admitting: Urology

## 2021-03-20 NOTE — Progress Notes (Signed)
Spoke w/ via phone for pre-op interview---PT Lab needs dos---- I STAT URINE POCT              Lab results------see below COVID test ------03-25-2021 1000 Arrive at -------1030 am 03-26-2021 NPO after MN NO Solid Food.  Clear liquids from MN until---930 am then npo Med rec completed Medications to take morning of surgery ----pepcid metoprolol succinate- Diabetic medication -----n/a Patient instructed to bring photo id and insurance card day of surgery  patient aware and will arrange driver/caregiver   for 24 hours after surgery  Patient Special Instructions -----none Pre-Op special Istructions -----none Patient verbalized understanding of instructions that were given at this phone interview. Patient denies shortness of breath, chest pain, fever, cough at this phone interview.  [facility] 03-14-2021 cardiology chart/epic Echo 03-05-2021 chart/epic ekg 03-14-2021 chart/epic Chest ct 03-05-2021 chart/epic  Reviewed pt history and chart with dr Greggory Stallion rose mda , pt ok for [facility] barring acute status changes per dr Stefanie Libel mda.

## 2021-03-25 ENCOUNTER — Other Ambulatory Visit (HOSPITAL_COMMUNITY)
Admission: RE | Admit: 2021-03-25 | Discharge: 2021-03-25 | Disposition: A | Discharge status: Home / Self Care | Payer: Medicaid Other | Source: Ambulatory Visit | Attending: Urology | Admitting: Urology

## 2021-03-25 DIAGNOSIS — Z01812 Encounter for preprocedural laboratory examination: Secondary | ICD-10-CM | POA: Diagnosis not present

## 2021-03-25 DIAGNOSIS — Z20822 Contact with and (suspected) exposure to covid-19: Secondary | ICD-10-CM | POA: Insufficient documentation

## 2021-03-25 LAB — SARS CORONAVIRUS 2 (TAT 6-24 HRS): SARS Coronavirus 2: NEGATIVE

## 2021-03-26 ENCOUNTER — Encounter (HOSPITAL_BASED_OUTPATIENT_CLINIC_OR_DEPARTMENT_OTHER)
Admission: RE | Disposition: A | Discharge status: Home / Self Care | Payer: Self-pay | Source: Home / Self Care | Attending: Urology

## 2021-03-26 ENCOUNTER — Ambulatory Visit (HOSPITAL_BASED_OUTPATIENT_CLINIC_OR_DEPARTMENT_OTHER): Payer: Self-pay | Admitting: Anesthesiology

## 2021-03-26 ENCOUNTER — Ambulatory Visit (HOSPITAL_BASED_OUTPATIENT_CLINIC_OR_DEPARTMENT_OTHER)
Admission: RE | Admit: 2021-03-26 | Discharge: 2021-03-26 | Disposition: A | Discharge status: Home / Self Care | Payer: Self-pay | Attending: Urology | Admitting: Urology

## 2021-03-26 ENCOUNTER — Encounter (HOSPITAL_BASED_OUTPATIENT_CLINIC_OR_DEPARTMENT_OTHER): Payer: Self-pay | Admitting: Urology

## 2021-03-26 DIAGNOSIS — Z87891 Personal history of nicotine dependence: Secondary | ICD-10-CM | POA: Insufficient documentation

## 2021-03-26 DIAGNOSIS — N201 Calculus of ureter: Secondary | ICD-10-CM | POA: Insufficient documentation

## 2021-03-26 DIAGNOSIS — Z885 Allergy status to narcotic agent status: Secondary | ICD-10-CM | POA: Insufficient documentation

## 2021-03-26 DIAGNOSIS — Z9089 Acquired absence of other organs: Secondary | ICD-10-CM | POA: Insufficient documentation

## 2021-03-26 DIAGNOSIS — Z91040 Latex allergy status: Secondary | ICD-10-CM | POA: Insufficient documentation

## 2021-03-26 DIAGNOSIS — Z8616 Personal history of COVID-19: Secondary | ICD-10-CM | POA: Insufficient documentation

## 2021-03-26 DIAGNOSIS — Z8619 Personal history of other infectious and parasitic diseases: Secondary | ICD-10-CM | POA: Insufficient documentation

## 2021-03-26 HISTORY — DX: Gastro-esophageal reflux disease without esophagitis: K21.9

## 2021-03-26 HISTORY — PX: CYSTOSCOPY W/ URETERAL STENT PLACEMENT: SHX1429

## 2021-03-26 HISTORY — DX: Sepsis, unspecified organism: A41.9

## 2021-03-26 HISTORY — DX: Headache, unspecified: R51.9

## 2021-03-26 HISTORY — DX: Anxiety disorder, unspecified: F41.9

## 2021-03-26 HISTORY — DX: Personal history of urinary calculi: Z87.442

## 2021-03-26 LAB — POCT PREGNANCY, URINE: Preg Test, Ur: NEGATIVE

## 2021-03-26 LAB — POCT I-STAT, CHEM 8
BUN: 7 mg/dL (ref 6–20)
Calcium, Ion: 1.2 mmol/L (ref 1.15–1.40)
Chloride: 100 mmol/L (ref 98–111)
Creatinine, Ser: 0.5 mg/dL (ref 0.44–1.00)
Glucose, Bld: 95 mg/dL (ref 70–99)
HCT: 39 % (ref 36.0–46.0)
Hemoglobin: 13.3 g/dL (ref 12.0–15.0)
Potassium: 3.3 mmol/L — ABNORMAL LOW (ref 3.5–5.1)
Sodium: 140 mmol/L (ref 135–145)
TCO2: 27 mmol/L (ref 22–32)

## 2021-03-26 SURGERY — CYSTOSCOPY, WITH RETROGRADE PYELOGRAM AND URETERAL STENT INSERTION
Anesthesia: General | Site: Pelvis | Laterality: Right

## 2021-03-26 MED ORDER — ONDANSETRON HCL 4 MG/2ML IJ SOLN
INTRAMUSCULAR | Status: DC | PRN
  Administered 2021-03-26: 4 mg via INTRAVENOUS

## 2021-03-26 MED ORDER — IBUPROFEN 100 MG/5ML PO SUSP
200.0000 mg | Freq: Four times a day (QID) | ORAL | Status: DC | PRN
Start: 2021-03-26 — End: 2021-03-26
  Filled 2021-03-26: qty 20

## 2021-03-26 MED ORDER — IBUPROFEN 200 MG PO TABS
200.0000 mg | ORAL_TABLET | Freq: Four times a day (QID) | ORAL | Status: DC | PRN
Start: 2021-03-26 — End: 2021-03-26

## 2021-03-26 MED ORDER — FENTANYL CITRATE (PF) 100 MCG/2ML IJ SOLN
INTRAMUSCULAR | Status: DC | PRN
  Administered 2021-03-26 (×2): 50 ug via INTRAVENOUS

## 2021-03-26 MED ORDER — PROPOFOL 10 MG/ML IV BOLUS
INTRAVENOUS | Status: DC | PRN
  Administered 2021-03-26: 200 mg via INTRAVENOUS

## 2021-03-26 MED ORDER — GLYCOPYRROLATE PF 0.2 MG/ML IJ SOSY
PREFILLED_SYRINGE | INTRAMUSCULAR | Status: AC
  Filled 2021-03-26: qty 1

## 2021-03-26 MED ORDER — DEXAMETHASONE SODIUM PHOSPHATE 10 MG/ML IJ SOLN
INTRAMUSCULAR | Status: DC | PRN
  Administered 2021-03-26: 10 mg via INTRAVENOUS

## 2021-03-26 MED ORDER — MIDAZOLAM HCL 2 MG/2ML IJ SOLN
INTRAMUSCULAR | Status: DC | PRN
  Administered 2021-03-26: 2 mg via INTRAVENOUS

## 2021-03-26 MED ORDER — ONDANSETRON HCL 4 MG/2ML IJ SOLN
INTRAMUSCULAR | Status: AC
  Filled 2021-03-26: qty 2

## 2021-03-26 MED ORDER — SCOPOLAMINE 1 MG/3DAYS TD PT72
MEDICATED_PATCH | TRANSDERMAL | Status: AC
  Filled 2021-03-26: qty 1

## 2021-03-26 MED ORDER — KETOROLAC TROMETHAMINE 30 MG/ML IJ SOLN
INTRAMUSCULAR | Status: AC
  Filled 2021-03-26: qty 1

## 2021-03-26 MED ORDER — FENTANYL CITRATE (PF) 100 MCG/2ML IJ SOLN
INTRAMUSCULAR | Status: AC
  Filled 2021-03-26: qty 2

## 2021-03-26 MED ORDER — SODIUM CHLORIDE 0.9 % IR SOLN
Status: DC | PRN
  Administered 2021-03-26: 3000 mL

## 2021-03-26 MED ORDER — KETOROLAC TROMETHAMINE 30 MG/ML IJ SOLN
INTRAMUSCULAR | Status: DC | PRN
  Administered 2021-03-26: 30 mg via INTRAVENOUS

## 2021-03-26 MED ORDER — MIDAZOLAM HCL 2 MG/2ML IJ SOLN
INTRAMUSCULAR | Status: AC
  Filled 2021-03-26: qty 2

## 2021-03-26 MED ORDER — KETOROLAC TROMETHAMINE 30 MG/ML IJ SOLN: 30.0000 mg | Freq: Once | INTRAMUSCULAR | Status: DC | PRN

## 2021-03-26 MED ORDER — ACETAMINOPHEN 10 MG/ML IV SOLN
INTRAVENOUS | Status: AC
  Filled 2021-03-26: qty 100

## 2021-03-26 MED ORDER — MEPERIDINE HCL 25 MG/ML IJ SOLN: 6.2500 mg | INTRAMUSCULAR | Status: DC | PRN

## 2021-03-26 MED ORDER — PROPOFOL 10 MG/ML IV BOLUS
INTRAVENOUS | Status: AC
  Filled 2021-03-26: qty 20

## 2021-03-26 MED ORDER — GLYCOPYRROLATE PF 0.2 MG/ML IJ SOSY
PREFILLED_SYRINGE | INTRAMUSCULAR | Status: DC | PRN
  Administered 2021-03-26: .1 mg via INTRAVENOUS

## 2021-03-26 MED ORDER — FENTANYL CITRATE (PF) 100 MCG/2ML IJ SOLN: 25.0000 ug | INTRAMUSCULAR | Status: DC | PRN

## 2021-03-26 MED ORDER — ONDANSETRON HCL 4 MG/2ML IJ SOLN: 4.0000 mg | Freq: Once | INTRAMUSCULAR | Status: DC | PRN

## 2021-03-26 MED ORDER — LIDOCAINE 2% (20 MG/ML) 5 ML SYRINGE
INTRAMUSCULAR | Status: AC
  Filled 2021-03-26: qty 5

## 2021-03-26 MED ORDER — GENTAMICIN SULFATE 40 MG/ML IJ SOLN
5.0000 mg/kg | INTRAVENOUS | Status: AC
  Administered 2021-03-26: 300 mg via INTRAVENOUS
  Filled 2021-03-26: qty 7.5

## 2021-03-26 MED ORDER — IOHEXOL 300 MG/ML  SOLN
INTRAMUSCULAR | Status: DC | PRN
  Administered 2021-03-26: 5 mL via URETHRAL

## 2021-03-26 MED ORDER — TRAMADOL HCL 50 MG PO TABS: 50.0000 mg | ORAL_TABLET | Freq: Four times a day (QID) | ORAL | 0 refills | Status: DC | PRN

## 2021-03-26 MED ORDER — DEXAMETHASONE SODIUM PHOSPHATE 10 MG/ML IJ SOLN
INTRAMUSCULAR | Status: AC
  Filled 2021-03-26: qty 1

## 2021-03-26 MED ORDER — LIDOCAINE 2% (20 MG/ML) 5 ML SYRINGE
INTRAMUSCULAR | Status: DC | PRN
  Administered 2021-03-26: 100 mg via INTRAVENOUS

## 2021-03-26 MED ORDER — ACETAMINOPHEN 10 MG/ML IV SOLN
INTRAVENOUS | Status: DC | PRN
  Administered 2021-03-26: 1000 mg via INTRAVENOUS

## 2021-03-26 MED ORDER — LACTATED RINGERS IV SOLN: INTRAVENOUS | Status: DC

## 2021-03-26 SURGICAL SUPPLY — 24 items
BAG DRAIN URO-CYSTO SKYTR STRL (DRAIN) ×3 IMPLANT
BAG DRN UROCATH (DRAIN) ×2
BASKET LASER NITINOL 1.9FR (BASKET) ×2 IMPLANT
BSKT STON RTRVL 120 1.9FR (BASKET) ×2
CATH INTERMIT  6FR 70CM (CATHETERS) ×2 IMPLANT
CLOTH BEACON ORANGE TIMEOUT ST (SAFETY) ×3 IMPLANT
FIBER LASER FLEXIVA 365 (UROLOGICAL SUPPLIES) IMPLANT
GLOVE SURG ENC MOIS LTX SZ7.5 (GLOVE) ×3 IMPLANT
GOWN STRL REUS W/TWL LRG LVL3 (GOWN DISPOSABLE) ×3 IMPLANT
GUIDEWIRE ANG ZIPWIRE 038X150 (WIRE) ×3 IMPLANT
GUIDEWIRE STR DUAL SENSOR (WIRE) ×3 IMPLANT
IV NS 1000ML (IV SOLUTION)
IV NS 1000ML BAXH (IV SOLUTION) ×1 IMPLANT
IV NS IRRIG 3000ML ARTHROMATIC (IV SOLUTION) ×5 IMPLANT
KIT TURNOVER CYSTO (KITS) ×3 IMPLANT
MANIFOLD NEPTUNE II (INSTRUMENTS) ×3 IMPLANT
NS IRRIG 500ML POUR BTL (IV SOLUTION) ×3 IMPLANT
PACK CYSTO (CUSTOM PROCEDURE TRAY) ×3 IMPLANT
SYR 10ML LL (SYRINGE) ×3 IMPLANT
TRACTIP FLEXIVA PULS ID 200XHI (Laser) IMPLANT
TRACTIP FLEXIVA PULSE ID 200 (Laser)
TUBE CONNECTING 12X1/4 (SUCTIONS) ×3 IMPLANT
TUBE FEEDING 8FR 16IN STR KANG (MISCELLANEOUS) ×2 IMPLANT
TUBING UROLOGY SET (TUBING) ×1 IMPLANT

## 2021-03-26 NOTE — Discharge Instructions (Signed)
1 - You may have urinary urgency (bladder spasms) and bloody urine on / off x few days. This is normal.  2 - Call MD or go to ER for fever >102, severe pain / nausea / vomiting not relieved by medications, or acute change in medical status   Post Anesthesia Home Care Instructions  Activity: Get plenty of rest for the remainder of the day. A responsible individual must stay with you for 24 hours following the procedure.  For the next 24 hours, DO NOT: -Drive a car -Advertising copywriter -Drink alcoholic beverages -Take any medication unless instructed by your physician -Make any legal decisions or sign important papers.  Meals: Start with liquid foods such as gelatin or soup. Progress to regular foods as tolerated. Avoid greasy, spicy, heavy foods. If nausea and/or vomiting occur, drink only clear liquids until the nausea and/or vomiting subsides. Call your physician if vomiting continues.  Special Instructions/Symptoms: Your throat may feel dry or sore from the anesthesia or the breathing tube placed in your throat during surgery. If this causes discomfort, gargle with warm salt water. The discomfort should disappear within 24 hours.   Lithotripsy, Care After This sheet gives you information about how to care for yourself after your procedure. Your health care provider may also give you more specific instructions. If you have problems or questions, contact your health care provider. What can I expect after the procedure? After the procedure, it is common to have:  Some blood in your urine. This should only last for a few days.  Soreness in your back, sides, or upper abdomen for a few days.  Blotches or bruises on the area where the shock wave entered the skin.  Pain, discomfort, or nausea when pieces (fragments) of the kidney stone move through the tube that carries urine from the kidney to the bladder (ureter). Stone fragments may pass soon after the procedure, but they may continue to  pass for up to 4-8 weeks. ? If you have severe pain or nausea, contact your health care provider. This may be caused by a large stone that was not broken up, and this may mean that you need more treatment.  Some pain or discomfort during urination.  Some pain or discomfort in the lower abdomen or (in men) at the base of the penis. Follow these instructions at home: Medicines  Take over-the-counter and prescription medicines only as told by your health care provider.  If you were prescribed an antibiotic medicine, take it as told by your health care provider. Do not stop taking the antibiotic even if you start to feel better.  Ask your health care provider if the medicine prescribed to you requires you to avoid driving or using machinery. Eating and drinking  Drink enough fluid to keep your urine pale yellow. This helps any remaining pieces of the stone to pass. It can also help prevent new stones from forming.  Eat plenty of fresh fruits and vegetables.  Follow instructions from your health care provider about eating or drinking restrictions. You may be instructed to: ? Reduce how much salt (sodium) you eat or drink. Check ingredients and nutrition facts on packaged foods and beverages to see how much sodium they contain. ? Reduce how much meat you eat.  Eat the recommended amount of calcium for your age and gender. Ask your health care provider how much calcium you should have.      General instructions  Get plenty of rest.  Return to your normal  activities as told by your health care provider. Ask your health care provider what activities are safe for you. Most people can resume normal activities 1-2 days after the procedure.  If you were given a sedative during the procedure, it can affect you for several hours. Do not drive or operate machinery until your health care provider says that it is safe.  Your health care provider may direct you to lie in a certain position (postural  drainage) and tap firmly (percuss) over your kidney area to help stone fragments pass. Follow instructions as told by your health care provider.  If directed, strain all urine through the strainer that was provided by your health care provider. ? Keep all fragments for your health care provider to see. Any stones that are found may be sent to a medical lab for examination. The stone may be as small as a grain of salt.  Keep all follow-up visits as told by your health care provider. This is important. Contact a health care provider if:  You have a fever or chills.  You have nausea that is severe or does not go away.  You have any of these urinary symptoms: ? Blood in your urine for longer than your health care provider told you to expect. ? Urine that smells bad or unusual. ? Feeling a strong urge to urinate after emptying your bladder. ? Pain or burning with urination that does not go away. ? Urinating more often than usual and this does not go away.  You have a stent and it comes out. Get help right away if:  You have severe pain in your back, sides, or upper abdomen.  You have any of these urinary symptoms: ? Severe pain while urinating. ? More blood in your urine or having blood in your urine when you did not before. ? Passing blood clots in your urine. ? Passing only a small amount of urine or being unable to pass any urine at all.  You have severe nausea that leads to persistent vomiting.  You faint. Summary  After this procedure, it is common to have some pain, discomfort, or nausea when pieces (fragments) of the kidney stone move through the tube that carries urine from the kidney to the bladder (ureter). If this pain or nausea is severe, however, you should contact your health care provider.  Return to your normal activities as told by your health care provider. Ask your health care provider what activities are safe for you.  Drink enough fluid to keep your urine pale  yellow. This helps any remaining pieces of the stone to pass, and it can help prevent new stones from forming.  If directed, strain your urine and keep all fragments for your health care provider to see. Fragments or stones may be as small as a grain of salt.  Get help right away if you have severe pain in your back, sides, or upper abdomen, or if you have severe pain while urinating. This information is not intended to replace advice given to you by your health care provider. Make sure you discuss any questions you have with your health care provider. Document Revised: 08/09/2019 Document Reviewed: 08/09/2019 Elsevier Patient Education  2021 [employer].

## 2021-03-26 NOTE — Anesthesia Preprocedure Evaluation (Signed)
Anesthesia Evaluation  Patient identified by MRN, date of birth, ID band Patient awake    Reviewed: Allergy & Precautions, NPO status , Patient's Chart, lab work & pertinent test results  History of Anesthesia Complications Negative for: history of anesthetic complications  Airway Mallampati: I  TM Distance: >3 FB Neck ROM: Full    Dental  (+) Dental Advisory Given, Poor Dentition, Chipped   Pulmonary asthma , former smoker,    Pulmonary exam normal        Cardiovascular Normal cardiovascular exam+ dysrhythmias Atrial Fibrillation  Rhythm:Regular Rate:Normal     Neuro/Psych  Headaches,    GI/Hepatic GERD  Medicated and Controlled,(+)     substance abuse  marijuana use,   Endo/Other  Hyperthyroidism (TSH now wnl)  Na 133 K 3.2 Ca 8.5 Mg 1.2   Renal/GU      Musculoskeletal negative musculoskeletal ROS (+)   Abdominal Normal abdominal exam  (+)   Peds  Hematology negative hematology ROS (+)   Anesthesia Other Findings    Reproductive/Obstetrics                             Anesthesia Physical  Anesthesia Plan  ASA: II  Anesthesia Plan: General   Post-op Pain Management:    Induction: Intravenous  PONV Risk Score and Plan: 3 and Treatment may vary due to age or medical condition, Ondansetron, Dexamethasone, Midazolam and Scopolamine patch - Pre-op  Airway Management Planned: LMA  Additional Equipment: None  Intra-op Plan:   Post-operative Plan: Extubation in OR  Informed Consent: I have reviewed the patients History and Physical, chart, labs and discussed the procedure including the risks, benefits and alternatives for the proposed anesthesia with the patient or authorized representative who has indicated his/her understanding and acceptance.     Dental advisory given  Plan Discussed with: CRNA  Anesthesia Plan Comments:         Anesthesia Quick  Evaluation

## 2021-03-26 NOTE — Brief Op Note (Signed)
03/26/2021  12:45 PM  PATIENT:  Laurie Richards  25 y.o. female  PRE-OPERATIVE DIAGNOSIS:  RIGHT URETERAL STONE  POST-OPERATIVE DIAGNOSIS:  RIGHT URETERAL STONE  PROCEDURE:  Procedure(s): CYSTOSCOPY WITH RETROGRADE PYELOGRAM/STONE BASKET EXTRACTION/URETERAL STENT REMOVAL (Right)  SURGEON:  Surgeon(s) and Role:    * Sebastian Ache, MD - Primary  PHYSICIAN ASSISTANT:   ASSISTANTS: none   ANESTHESIA:   general  EBL:  minimal   BLOOD ADMINISTERED:none  DRAINS: none   LOCAL MEDICATIONS USED:  NONE  SPECIMEN:  Source of Specimen:  Rt ureteral stone  DISPOSITION OF SPECIMEN:  [facility] for compositional analysis  COUNTS:  YES  TOURNIQUET:  * No tourniquets in log *  DICTATION: .Other Dictation: Dictation Number [id number]  PLAN OF CARE: Discharge to home after PACU  PATIENT DISPOSITION:  PACU - hemodynamically stable.   Delay start of Pharmacological VTE agent (>24hrs) due to surgical blood loss or risk of bleeding: yes

## 2021-03-26 NOTE — H&P (Signed)
Laurie Richards is an 25 y.o. female.    Chief Complaint: Pre-OP RIGHT Ureteroscopic Stone Manipulation  HPI:   1 - Rt Distal Ureteral Stone - approx 56mm Rt distal fusiform ureteral stoen with minimal hydro on ER CT 02/2021 on eval Rt flank pain, fevers, malaise. Stone is solitary. Temporized with ureteral stent placement 03/05/21.   2 - Obstructing Pyelonephritis - h/o e.coli pyelo 02/2021 RES amp but sens cipri, gent, bactrim and treated with IV bridged to PO course and stenting Rt kidney.   Today "Laurie Richards" is seen to proceed with RT ureteroscopy with  goal of stone free. . C19 screen negative. She does have low grade fever today but no new flank pain, dysuria or cough/congestion.    Past Medical History:  Diagnosis Date  . Anxiety   . Asthma    MILD NO INHALER USE  . COVID 2021   LOSS OF TASTE AND SMELL SOB, COUGH X 1 WEEK ALL SYMPTOMS RESOLVED  . GERD (gastroesophageal reflux disease)   . Headache    MIGRAINES OCC ALSO RARELY  . History of kidney stones   . Irregular heartbeat    a. occasional skipped beats since age 45.  . Marijuana abuse    a. smokes 1-2 x/wk.  Marland Kitchen PAF (paroxysmal atrial fibrillation) (HCC)    a. Dx 09/2017 in setting of mild hyperthyroidism.  . Sepsis (HCC) 03-03-2021 to 03-10-2021 in [facility]   with e coli and acute pyelonephrisis  . Tobacco abuse    a. smoking cigarettes since age 25.    Past Surgical History:  Procedure Laterality Date  . APPENDECTOMY  2018   OPEN  . CYSTOSCOPY WITH URETEROSCOPY AND STENT PLACEMENT Right 03/04/2021   Procedure: CYSTOSCOPY, RETROGRADE PYELOGRAM, RIGHT STENT PLACEMENT;  Surgeon: Sebastian Ache, MD;  Location: [facility] OR;  Service: Urology;  Laterality: Right;  . MOUTH SURGERY     1 WISDOM TOOTH PULLED    Family History  Problem Relation Age of Onset  . Heart failure Mother   . Coronary artery disease Mother        Status post MI and stenting in her 30s  . Diabetes Other   . Coronary artery disease Maternal Uncle         Status post stenting at a young age  . Coronary artery disease Maternal Uncle        Status post stenting at a young age   Social History:  reports that she quit smoking about a year ago. Her smoking use included cigarettes. She has a 8.00 pack-year smoking history. She has never used smokeless tobacco. She reports previous alcohol use. She reports previous drug use. Drugs: Heroin and Methamphetamines.  Allergies:  Allergies  Allergen Reactions  . Acetaminophen-Codeine Hives, Itching, Nausea And Vomiting, Swelling, Rash and Other (See Comments)    Mother had an anaphylactic reaction (patient stated)- And, vomiting if taken on empty stomach PT HAS N/V AND HIVES  . Codeine Hives, Itching, Nausea And Vomiting, Swelling, Rash and Other (See Comments)    Mother had an anaphylactic reaction (patient stated)  . Latex Rash and Other (See Comments)    Soreness, also    No medications prior to admission.    Results for orders placed or performed during the hospital encounter of 03/25/21 (from the past 48 hour(s))  SARS CORONAVIRUS 2 (TAT 6-24 HRS) Nasopharyngeal Nasopharyngeal Swab     Status: None   Collection Time: 03/25/21 10:13 AM   Specimen: Nasopharyngeal Swab  Result Value Ref  Range   SARS Coronavirus 2 NEGATIVE NEGATIVE    Comment: (NOTE) SARS-CoV-2 target nucleic acids are NOT DETECTED.  The SARS-CoV-2 RNA is generally detectable in upper and lower respiratory specimens during the acute phase of infection. Negative results do not preclude SARS-CoV-2 infection, do not rule out co-infections with other pathogens, and should not be used as the sole basis for treatment or other patient management decisions. Negative results must be combined with clinical observations, patient history, and epidemiological information. The expected result is Negative.  Fact Sheet for Patients: HairSlick.no  Fact Sheet for Healthcare  Providers: quierodirigir.com  This test is not yet approved or cleared by the [place] FDA and  has been authorized for detection and/or diagnosis of SARS-CoV-2 by FDA under an Emergency Use Authorization (EUA). This EUA will remain  in effect (meaning this test can be used) for the duration of the COVID-19 declaration under Se ction 564(b)(1) of the Act, 21 U.S.C. section 360bbb-3(b)(1), unless the authorization is terminated or revoked sooner.  Performed at [facility], [address], [place], [place] [address]    No results found.  Review of Systems  Constitutional: Negative for chills and fever.  Genitourinary: Positive for urgency.  All other systems reviewed and are negative.   Height 5\' 4"  (1.626 m), weight 58.1 kg, last menstrual period 02/22/2021, unknown if currently breastfeeding. Physical Exam Vitals reviewed.  HENT:     Nose: Nose normal.  Cardiovascular:     Rate and Rhythm: Normal rate.  Abdominal:     General: Abdomen is flat.  Genitourinary:    Comments: No CVAT at present.  Musculoskeletal:        General: Normal range of motion.     Cervical back: Normal range of motion.  Neurological:     General: No focal deficit present.     Mental Status: She is alert.  Psychiatric:        Mood and Affect: Mood normal.      Assessment/Plan  Proceed as planned with cysto, RIGHT retrogarde, ureteroscopy , stent exchange for definitive stone management. Risks, benefits, alternatives, expected peri-op course discussed previously and reiterated today. If any intraop concern for primary GU infection, will exchange stent only.   02/24/2021, MD 03/26/2021, 6:46 AM

## 2021-03-26 NOTE — Anesthesia Postprocedure Evaluation (Signed)
Anesthesia Post Note  Patient: Laurie Richards  Procedure(s) Performed: CYSTOSCOPY WITH RETROGRADE PYELOGRAM/URETROSCOPY/STONE BASKET EXTRACTION/URETERAL STENT REMOVAL (Right Pelvis)     Patient location during evaluation: PACU Anesthesia Type: General Level of consciousness: awake Pain management: pain level controlled Vital Signs Assessment: post-procedure vital signs reviewed and stable Respiratory status: spontaneous breathing Cardiovascular status: stable Postop Assessment: no apparent nausea or vomiting Anesthetic complications: no   No complications documented.  Last Vitals:  Vitals:   03/26/21 1330 03/26/21 1345  BP: 99/62 98/64  Pulse: 96 96  Resp: 20 16  Temp: 36.9 C 36.9 C  SpO2: 98% 100%    Last Pain:  Vitals:   03/26/21 1345  TempSrc:   PainSc: 0-No pain                 Caren Macadam

## 2021-03-26 NOTE — Anesthesia Procedure Notes (Signed)
Procedure Name: LMA Insertion Date/Time: 03/26/2021 12:25 PM Performed by: Norva Pavlov, CRNA Pre-anesthesia Checklist: Patient identified, Emergency Drugs available, Suction available and Patient being monitored Patient Re-evaluated:Patient Re-evaluated prior to induction Oxygen Delivery Method: Circle system utilized Preoxygenation: Pre-oxygenation with 100% oxygen Induction Type: IV induction Ventilation: Mask ventilation without difficulty LMA: LMA inserted LMA Size: 3.0 Number of attempts: 1 Airway Equipment and Method: Bite block Placement Confirmation: positive ETCO2 Tube secured with: Tape Dental Injury: Teeth and Oropharynx as per pre-operative assessment

## 2021-03-26 NOTE — Transfer of Care (Signed)
Immediate Anesthesia Transfer of Care Note  Patient: Laurie Richards  Procedure(s) Performed: Procedure(s) (LRB): CYSTOSCOPY WITH RETROGRADE PYELOGRAM/URETROSCOPY/STONE BASKET EXTRACTION/URETERAL STENT REMOVAL (Right)  Patient Location: PACU  Anesthesia Type: General  Level of Consciousness: awake, alert  and oriented  Airway & Oxygen Therapy: Patient Spontanous Breathing and Patient connected to nasal cannula oxygen  Post-op Assessment: Report given to PACU RN and Post -op Vital signs reviewed and stable  Post vital signs: Reviewed and stable  Complications: No apparent anesthesia complications  Last Vitals:  Vitals Value Taken Time  BP 104/68 03/26/21 1300  Temp 37.6 C 03/26/21 1257  Pulse 101 03/26/21 1306  Resp 20 03/26/21 1306  SpO2 99 % 03/26/21 1306  Vitals shown include unvalidated device data.  Last Pain:  Vitals:   03/26/21 1257  TempSrc:   PainSc: 0-No pain      Patients Stated Pain Goal: 4 (03/26/21 1257)  Complications: No complications documented.

## 2021-03-27 ENCOUNTER — Encounter (HOSPITAL_BASED_OUTPATIENT_CLINIC_OR_DEPARTMENT_OTHER): Payer: Self-pay | Admitting: Urology

## 2021-03-27 NOTE — Op Note (Signed)
Laurie Richards, PORCO MEDICAL RECORD NO: [id number] ACCOUNT NO: [id number] DATE OF BIRTH: May 12, 1996 FACILITY: [facility] LOCATION: [facility] PHYSICIAN: Sebastian Ache, MD  Operative Report   DATE OF PROCEDURE: 03/26/2021  PREOPERATIVE DIAGNOSES: Right ureteral stone, history of urosepsis.  PROCEDURE: 1.  Cystoscopy, right retrograde pyelogram interpretation. 2.  Right ureteroscopy, basketing of stone. 3.  Removal of right ureteral stent.  ESTIMATED BLOOD LOSS:  Nil.  COMPLICATIONS:  None.  SPECIMEN: Right ureteral stone for composite analysis.  FINDINGS: 1.  Right distal ureteral stone with minimal signs of impaction.  This was amenable to simple basketing. 2.  No additional calculi within the right kidney or ureter.  INDICATIONS FOR PROCEDURE:  The patient is a 25 year old lady with recent history of right ureteral stone, complicated by urosepsis and bacteremia.  She at the time of presentation underwent stenting as a temporizing measure for renal decompression  followed by admission with intravenous antibiotics for management of obstructing stone in the setting of sepsis.  She cleared her infectious parameters, now presents for definitive stone management.  Informed consent was obtained, placed in the medical  record.   PROCEDURE IN DETAIL:  The patient being verified, procedure being right ureteroscopic stone manipulation was confirmed.  Procedure timeout performed. Intravenous antibiotics administered. General LMA anesthesia induced.  The patient placed into a low  lithotomy position.  Sterile field was created. Prepped and draped the patient's vagina, introitus and proximal thigh using iodine.  Cystourethroscopy was performed using 21-French rigid cystoscope with offset lens.  Inspection of the bladder revealed no  diverticula, calcifications, papillary lesions. Distal end of the right stent was seen in situ, was grasped, brought to the level of the urethral meatus and a 0.03  ZIPwire was advanced to the level of the upper pole.  It was exchanged for an open-ended  catheter and right retrograde pyelogram was obtained.  Right retrograde pyelogram revealed a single right ureter, single system right kidney, questionable filling defect in the distal ureter consistent with known stone.  ZIPwire was once again advanced and set aside as a safety wire.  An 8-French feeding  tube was placed in the urinary bladder for pressure release and semi-rigid ureteroscopy was performed in the distal right ureter alongside the separate sensor working wire and as anticipated in the distal ureter, there was a relatively fusiform stone  with minimal impaction.  This did appear amenable to simple basketing.  It was grasped with the Escape basket on its long axis and removed in its entirety, set aside for composite analysis.  Repeat ureteroscopy using the semirigid scope of the remaining  distal four-fifths of the right ureter revealed no additional mucosal abnormalities or stones.  As the goal today was to confirm stone free, the semirigid scope was exchanged with a sensor working wire for the single channel digital ureteroscope to the  level of the proximal ureter and the right proximal ureter and kidney was inspected.  No additional calcifications were noted, whatsoever.  The flexible scope was removed under continuous vision.  No significant mucosal abnormalities were found.  Given  the lack of significant stone impaction or edema and lack of access sheath usage, it was felt that there was no indication for continued stenting and the procedure was terminated.  The patient tolerated procedure well.  No immediate perioperative  complications.  The patient taken to the postanesthesia care unit in stable condition.  Plan to discharge home.   SHW D: 03/26/2021 12:49:41 pm T: 03/26/2021 9:34:00 pm  JOB:  [id number]/ [id number]   

## 2021-04-09 ENCOUNTER — Other Ambulatory Visit (HOSPITAL_COMMUNITY): Payer: Medicaid Other

## 2021-04-09 ENCOUNTER — Encounter (HOSPITAL_COMMUNITY): Payer: Self-pay | Admitting: Physician Assistant

## 2021-04-23 ENCOUNTER — Telehealth (HOSPITAL_COMMUNITY): Payer: Self-pay | Admitting: Physician Assistant

## 2021-04-23 NOTE — Telephone Encounter (Signed)
Just an FYI. We have made several attempts to contact this patient including sending a letter to schedule or reschedule their echocardiogram. We will be removing the patient from the echo WQ.    04/09/21 MAILED LETTER [facility]  04/08/21 Pt LVM she needed to reschedule echo, I have Cx'd and called patient to reschedule x2 and NVM set up @ 2:06, 1:45pm [facility]     Thank you

## 2021-06-13 ENCOUNTER — Ambulatory Visit: Payer: Self-pay | Admitting: Cardiology

## 2021-11-13 ENCOUNTER — Ambulatory Visit
Admission: EM | Admit: 2021-11-13 | Discharge: 2021-11-13 | Disposition: A | Discharge status: Home / Self Care | Payer: Self-pay | Attending: Emergency Medicine | Admitting: Emergency Medicine

## 2021-11-13 ENCOUNTER — Encounter: Payer: Self-pay | Admitting: Emergency Medicine

## 2021-11-13 ENCOUNTER — Other Ambulatory Visit: Payer: Self-pay

## 2021-11-13 DIAGNOSIS — K029 Dental caries, unspecified: Secondary | ICD-10-CM

## 2021-11-13 MED ORDER — AMOXICILLIN 875 MG PO TABS: 875.0000 mg | ORAL_TABLET | Freq: Two times a day (BID) | ORAL | 0 refills | Status: AC

## 2021-11-13 NOTE — Discharge Instructions (Addendum)
Take the antibiotic as prescribed.    A dental resource guide is attached.  Please call to make an appointment with a dentist as soon as possible.    Go to the emergency department if you have acute worsening symptoms.     

## 2021-11-13 NOTE — ED Triage Notes (Signed)
Pt here with dental abscess inside lower right side of mouth. Pt endorses genetic and ongoing dental issues and does not currently have a dentist.

## 2021-11-13 NOTE — ED Provider Notes (Signed)
Laurie Richards    CSN: [id number] Arrival date & time: 11/13/21  1434      History   Chief Complaint Chief Complaint  Patient presents with   Dental Problem    HPI Laurie Richards is a 26 y.o. female.  Patient presents with right lower dental pain x 3 days.  She denies fever, difficulty swallowing, difficulty breathing, or other symptoms.  Treatment at home with Tylenol and ibuprofen.  She has not seen a dentist yet.  Her medical history includes asthma, atrial fibrillation, marijuana abuse, history of heroin and methamphetamine use.  The history is provided by the patient and medical records.   Past Medical History:  Diagnosis Date   Anxiety    Asthma    MILD NO INHALER USE   COVID 2021   LOSS OF TASTE AND SMELL SOB, COUGH X 1 WEEK ALL SYMPTOMS RESOLVED   GERD (gastroesophageal reflux disease)    Headache    MIGRAINES OCC ALSO RARELY   History of kidney stones    Irregular heartbeat    a. occasional skipped beats since age 34.   Marijuana abuse    a. smokes 1-2 x/wk.   PAF (paroxysmal atrial fibrillation) (HCC)    a. Dx 09/2017 in setting of mild hyperthyroidism.   Sepsis (HCC) 03-03-2021 to 03-10-2021 in [facility]   with e coli and acute pyelonephrisis   Tobacco abuse    a. smoking cigarettes since age 34.    Patient Active Problem List   Diagnosis Date Noted   Pyelonephritis 03/04/2021   Tachycardia 03/04/2021   Hypokalemia 03/04/2021   Sepsis (HCC) 03/04/2021   Ureteral stone with hydronephrosis 03/04/2021   Smoker 10/10/2017   Hyperthyroidism 10/10/2017   A-fib (HCC) 09/22/2017    Past Surgical History:  Procedure Laterality Date   APPENDECTOMY  2018   OPEN   CYSTOSCOPY W/ URETERAL STENT PLACEMENT Right 03/26/2021   Procedure: CYSTOSCOPY WITH RETROGRADE PYELOGRAM/URETROSCOPY/STONE BASKET EXTRACTION/URETERAL STENT REMOVAL;  Surgeon: Sebastian Ache, MD;  Location: [facility];  Service: Urology;  Laterality: Right;   CYSTOSCOPY WITH  URETEROSCOPY AND STENT PLACEMENT Right 03/04/2021   Procedure: CYSTOSCOPY, RETROGRADE PYELOGRAM, RIGHT STENT PLACEMENT;  Surgeon: Sebastian Ache, MD;  Location: [facility] OR;  Service: Urology;  Laterality: Right;   MOUTH SURGERY     1 WISDOM TOOTH PULLED    OB History     Gravida  4   Para      Term      Preterm      AB  4   Living  0      SAB  4   IAB      Ectopic      Multiple      Live Births               Home Medications    Prior to Admission medications   Medication Sig Start Date End Date Taking? Authorizing Provider  amoxicillin (AMOXIL) 875 MG tablet Take 1 tablet (875 mg total) by mouth 2 (two) times daily for 7 days. 11/13/21 11/20/21 Yes Mickie Bail, NP  acetaminophen (TYLENOL) 325 MG tablet Take 650 mg by mouth every 6 (six) hours as needed.    [provider]  cetirizine (ZYRTEC ALLERGY) 10 MG tablet Take 1 tablet (10 mg total) by mouth daily. Patient taking differently: Take 10 mg by mouth at bedtime. 01/16/21   Wallis Bamberg, PA-C  famotidine (PEPCID) 20 MG tablet Take 1 tablet (20 mg total)  by mouth 2 (two) times daily for 14 days. Patient taking differently: Take 20 mg by mouth 2 (two) times daily. 03/09/21 03/23/21  Osvaldo Shipper, MD  metoprolol succinate (TOPROL-XL) 25 MG 24 hr tablet Take 1 tablet (25 mg total) by mouth daily. 03/14/21   Tereso Newcomer T, PA-C  polyethylene glycol (MIRALAX / GLYCOLAX) 17 g packet Take 17 g by mouth as needed for mild constipation or moderate constipation.    [provider]  senna (SENOKOT) 8.6 MG TABS tablet Take 2 tablets (17.2 mg total) by mouth daily. 03/10/21   Osvaldo Shipper, MD  traMADol (ULTRAM) 50 MG tablet Take 1-2 tablets (50-100 mg total) by mouth every 6 (six) hours as needed for moderate pain or severe pain. Post-operatively 03/26/21   Sebastian Ache, MD  diltiazem (CARDIZEM) 30 MG tablet Take 1 tablet (30 mg total) as needed by mouth. If HR more than 100 09/23/17 12/23/20  Katha Hamming,  MD    Family History Family History  Problem Relation Age of Onset   Heart failure Mother    Coronary artery disease Mother        Status post MI and stenting in her 30s   Diabetes Other    Coronary artery disease Maternal Uncle        Status post stenting at a young age   Coronary artery disease Maternal Uncle        Status post stenting at a young age    Social History Social History   Tobacco Use   Smoking status: Former    Packs/day: 1.00    Years: 8.00    Pack years: 8.00    Types: Cigarettes    Quit date: 04/09/2020    Years since quitting: 1.5   Smokeless tobacco: Never  Vaping Use   Vaping Use: Never used  Substance Use Topics   Alcohol use: Not Currently   Drug use: Not Currently    Types: Heroin, Methamphetamines    Comment: HEROIN AND METH LAST USED JUNE  2021 PER PT      Allergies   Acetaminophen-codeine, Codeine, and Latex   Review of Systems Review of Systems  Constitutional:  Negative for chills and fever.  HENT:  Positive for dental problem. Negative for facial swelling, sore throat and trouble swallowing.   Skin:  Negative for color change and rash.  All other systems reviewed and are negative.   Physical Exam Triage Vital Signs ED Triage Vitals  Enc Vitals Group     BP      Pulse      Resp      Temp      Temp src      SpO2      Weight      Height      Head Circumference      Peak Flow      Pain Score      Pain Loc      Pain Edu?      Excl. in GC?    No data found.  Updated Vital Signs BP 115/75    Pulse 83    Temp 98.1 F (36.7 C)    Resp 20    SpO2 98%   Visual Acuity Right Eye Distance:   Left Eye Distance:   Bilateral Distance:    Right Eye Near:   Left Eye Near:    Bilateral Near:     Physical Exam Vitals and nursing note reviewed.  Constitutional:  General: She is not in acute distress.    Appearance: She is well-developed. She is not ill-appearing.  HENT:     Mouth/Throat:     Mouth: Mucous membranes  are moist.     Dentition: Abnormal dentition. Dental tenderness and dental caries present.     Comments: Teeth in poor repair.  Numerous broken and carious teeth.  Cardiovascular:     Rate and Rhythm: Normal rate and regular rhythm.     Heart sounds: Normal heart sounds.  Pulmonary:     Effort: Pulmonary effort is normal. No respiratory distress.     Breath sounds: Normal breath sounds.  Musculoskeletal:     Cervical back: Neck supple.  Skin:    General: Skin is warm and dry.  Neurological:     Mental Status: She is alert.  Psychiatric:        Mood and Affect: Mood normal.        Behavior: Behavior normal.     UC Treatments / Results  Labs (all labs ordered are listed, but only abnormal results are displayed) Labs Reviewed - No data to display  EKG   Radiology No results found.  Procedures Procedures (including critical care time)  Medications Ordered in UC Medications - No data to display  Initial Impression / Assessment and Plan / UC Course  I have reviewed the triage vital signs and the nursing notes.  Pertinent labs & imaging results that were available during my care of the patient were reviewed by me and considered in my medical decision making (see chart for details).    Pain due to dental caries.  Treating with amoxicillin.  Discussed Tylenol or ibuprofen as needed for discomfort.  Instructed patient to follow-up as soon as possible with a dentist; dental resource guide provided.  Education provided on dental caries.  Patient agrees to plan of care.  Final Clinical Impressions(s) / UC Diagnoses   Final diagnoses:  Pain due to dental caries     Discharge Instructions      Take the antibiotic as prescribed.    A dental resource guide is attached.  Please call to make an appointment with a dentist as soon as possible.    Go to the emergency department if you have acute worsening symptoms.         ED Prescriptions     Medication Sig Dispense  Auth. Provider   amoxicillin (AMOXIL) 875 MG tablet Take 1 tablet (875 mg total) by mouth 2 (two) times daily for 7 days. 14 tablet Mickie Bail, NP      I have reviewed the PDMP during this encounter.   Mickie Bail, NP 11/13/21 [phone]

## 2022-05-18 ENCOUNTER — Other Ambulatory Visit: Payer: Self-pay

## 2022-05-18 ENCOUNTER — Emergency Department (HOSPITAL_COMMUNITY)
Admission: EM | Admit: 2022-05-18 | Discharge: 2022-05-18 | Disposition: A | Discharge status: Home / Self Care | Payer: Self-pay | Attending: Emergency Medicine | Admitting: Emergency Medicine

## 2022-05-18 ENCOUNTER — Emergency Department (HOSPITAL_COMMUNITY): Payer: Self-pay

## 2022-05-18 ENCOUNTER — Encounter (HOSPITAL_COMMUNITY): Payer: Self-pay | Admitting: Emergency Medicine

## 2022-05-18 DIAGNOSIS — Z91040 Latex allergy status: Secondary | ICD-10-CM | POA: Insufficient documentation

## 2022-05-18 DIAGNOSIS — M79604 Pain in right leg: Secondary | ICD-10-CM | POA: Insufficient documentation

## 2022-05-18 DIAGNOSIS — R0781 Pleurodynia: Secondary | ICD-10-CM | POA: Insufficient documentation

## 2022-05-18 DIAGNOSIS — Z79899 Other long term (current) drug therapy: Secondary | ICD-10-CM | POA: Insufficient documentation

## 2022-05-18 DIAGNOSIS — M25551 Pain in right hip: Secondary | ICD-10-CM | POA: Insufficient documentation

## 2022-05-18 DIAGNOSIS — M79651 Pain in right thigh: Secondary | ICD-10-CM | POA: Insufficient documentation

## 2022-05-18 LAB — I-STAT BETA HCG BLOOD, ED (MC, WL, AP ONLY): I-stat hCG, quantitative: <5 m[IU]/mL (ref <5)

## 2022-05-18 MED ORDER — METHOCARBAMOL 500 MG PO TABS: 500.0000 mg | ORAL_TABLET | Freq: Two times a day (BID) | ORAL | 0 refills | Status: DC

## 2022-05-18 MED ORDER — NAPROXEN 500 MG PO TABS: 500.0000 mg | ORAL_TABLET | Freq: Two times a day (BID) | ORAL | 0 refills | Status: DC
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
## 2022-05-18 NOTE — Discharge Instructions (Signed)
X-rays today were negative.  Will likely be sore for a few days which is normal. Take the prescribed medication as directed for pain. Follow-up with your primary care doctor. Return to the ED for new or worsening symptoms.

## 2022-10-09 DIAGNOSIS — Z419 Encounter for procedure for purposes other than remedying health state, unspecified: Secondary | ICD-10-CM | POA: Diagnosis not present

## 2022-11-09 DIAGNOSIS — Z419 Encounter for procedure for purposes other than remedying health state, unspecified: Secondary | ICD-10-CM | POA: Diagnosis not present

## 2022-11-19 IMAGING — RF DG RETROGRADE PYELOGRAM
1 series · 5 of 5 positions shown · non-contrast
Comparison: CT abdomen pelvis 03/04/2021

CLINICAL DATA: Cystoscopy

Right stent placement
Right ureteral calculus
EXAM:
RETROGRADE PYELOGRAM

[Series 1: run · 5 of 5 slices shown]
[im 1/5]
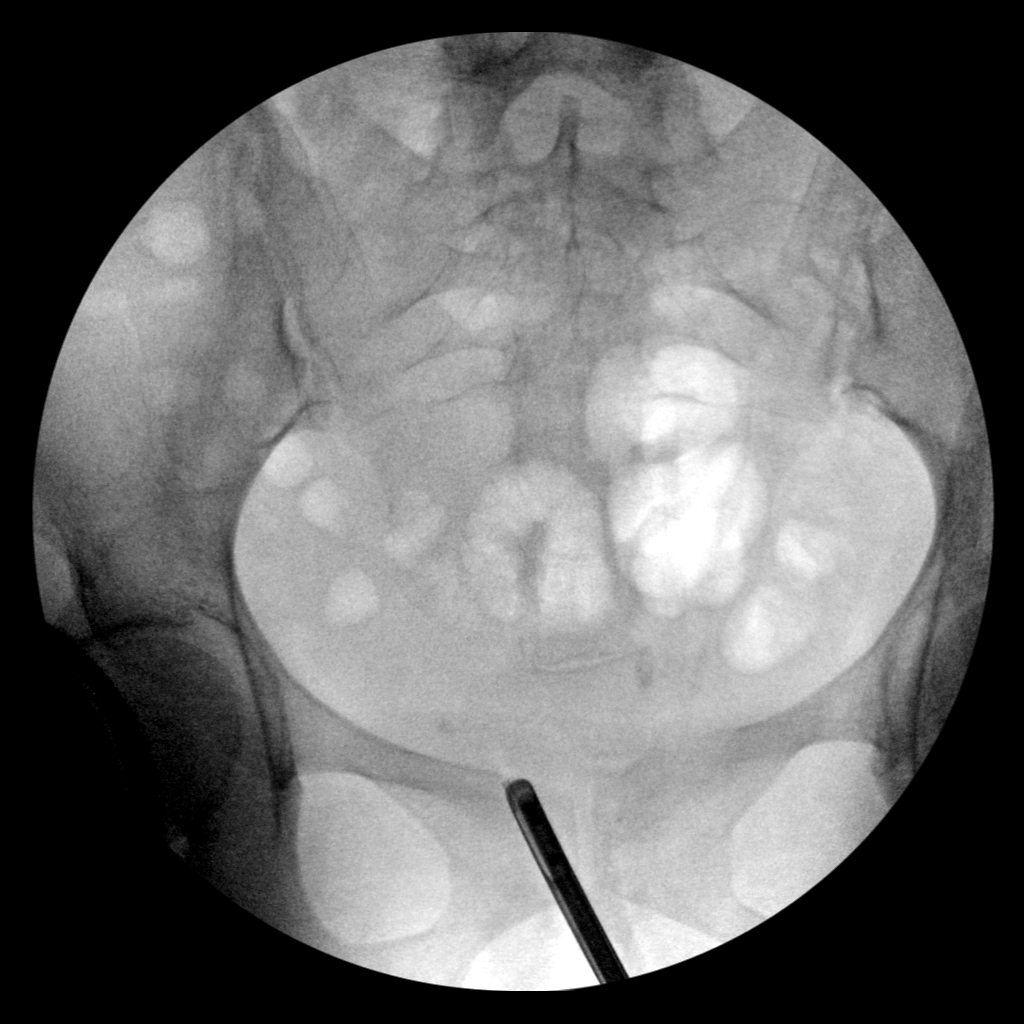
[im 2/5]
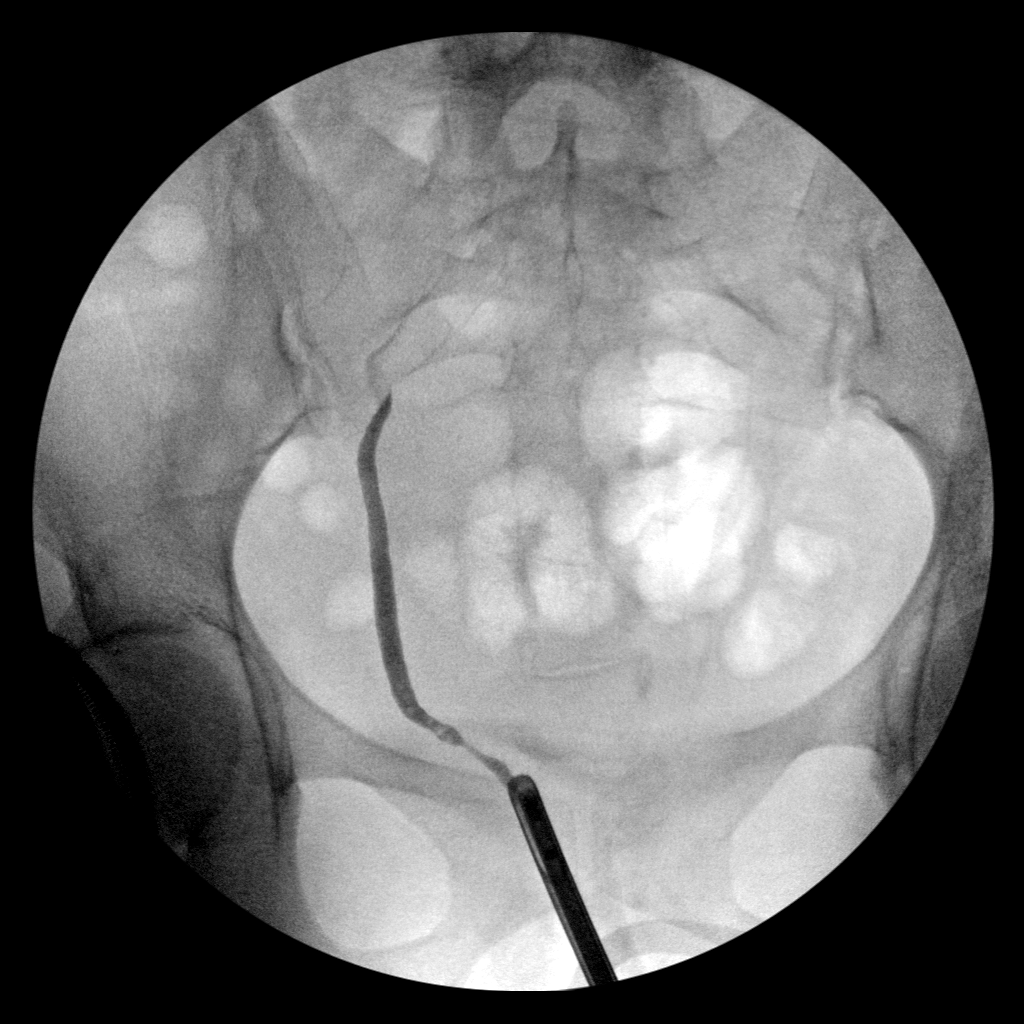
[im 3/5]
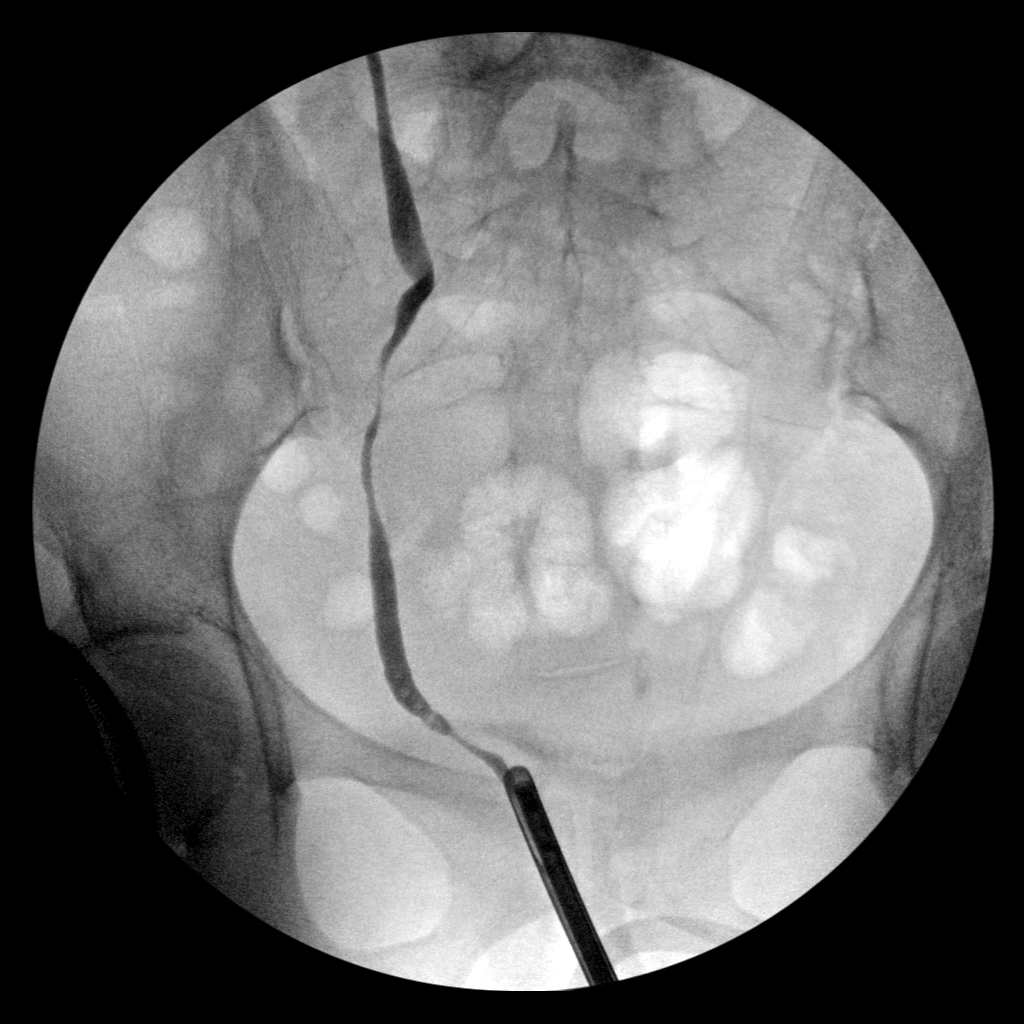
[im 4/5]
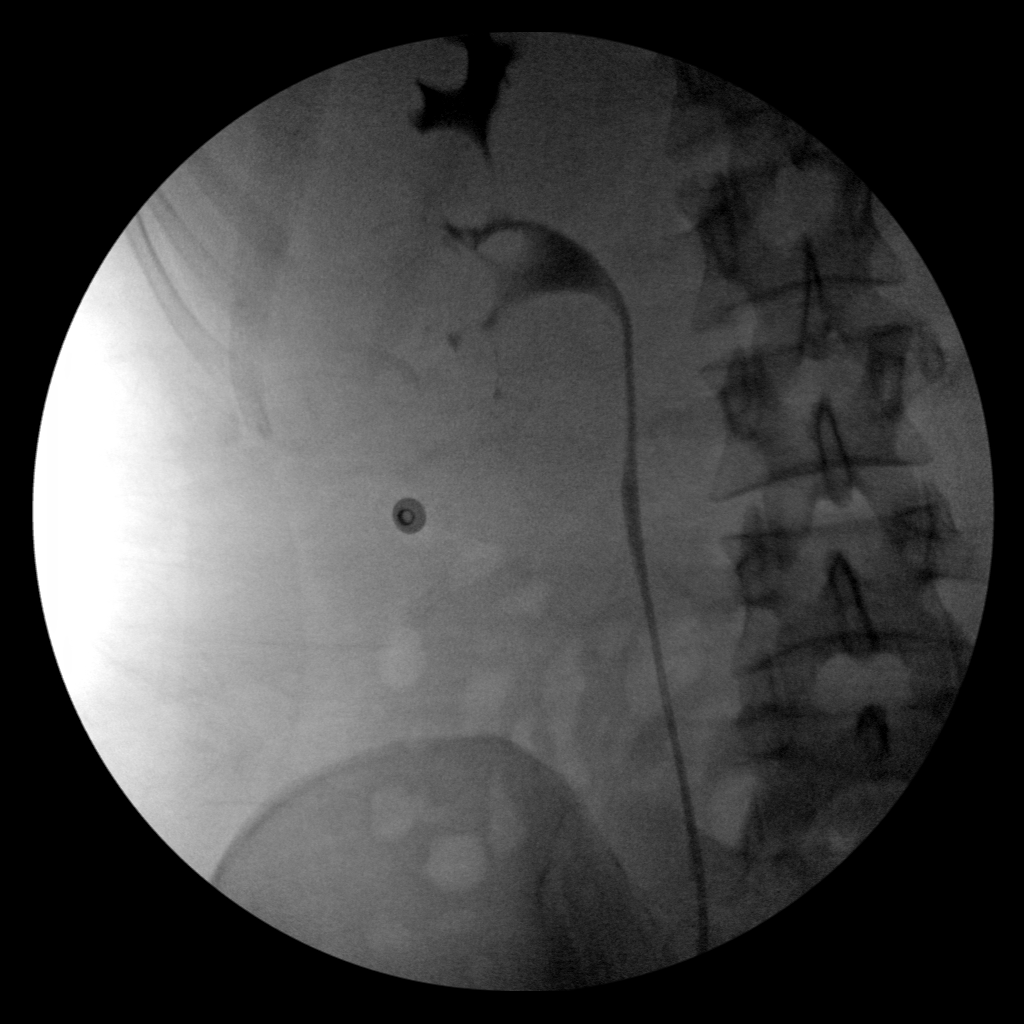
[im 5/5]
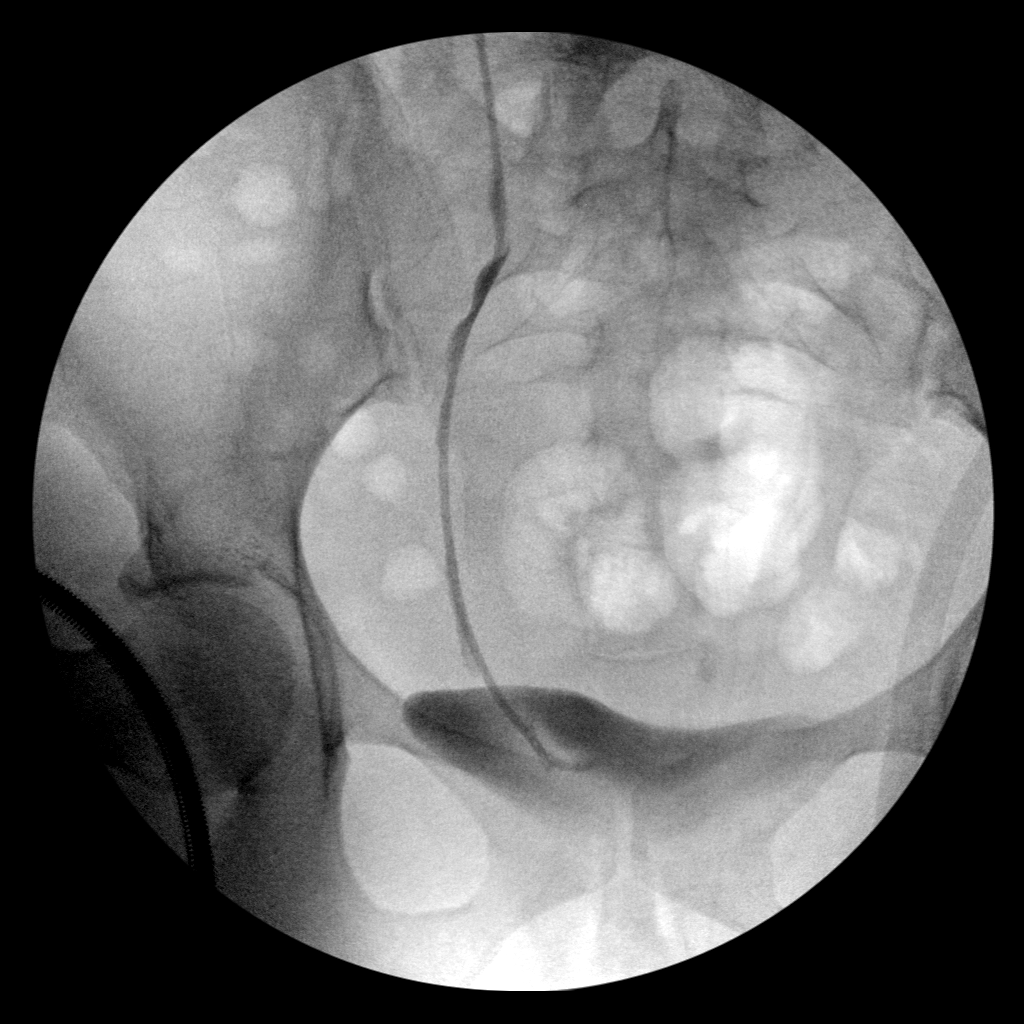

[5 of 5 positions shown; findings below may reference images not displayed]

FINDINGS: Five intraoperative images were submitted for interpretation.
Multiple small filling defects seen in the distal ureter on the
second and third images likely due to calculi. The last 2 images
demonstrate the proximal and distal ends of the ureteral stent.
IMPRESSION: Intraoperative fluoroscopic images of right retrograde pyelogram as
above.

## 2022-11-20 IMAGING — CT CT ANGIO CHEST
3 of 7 series · 19 of 36 positions shown · IV contrast (omnipaque)
Comparison: CT Abdomen and Pelvis 03/04/2021. Chest radiographs
03/03/2021.

CLINICAL DATA: 24-year-old female presenting with right side
pyelonephritis, possible superimposed distal obstructing ureteral
stone. Status post retrograde pyelogram and right ureteral stent
placement yesterday.

EXAM:
CT ANGIOGRAPHY CHEST WITH CONTRAST
TECHNIQUE: Multidetector CT imaging of the chest was performed using the
standard protocol during bolus administration of intravenous
contrast. Multiplanar CT image reconstructions and MIPs were
obtained to evaluate the vascular anatomy.
CONTRAST:  80mL OMNIPAQUE IOHEXOL 300 MG/ML  SOLN

[Series 6: pe thins · axial · 0.63mm/px · z∈[-212,+37]mm · 15 of 408 slices shown]
[im 26/408  lung]
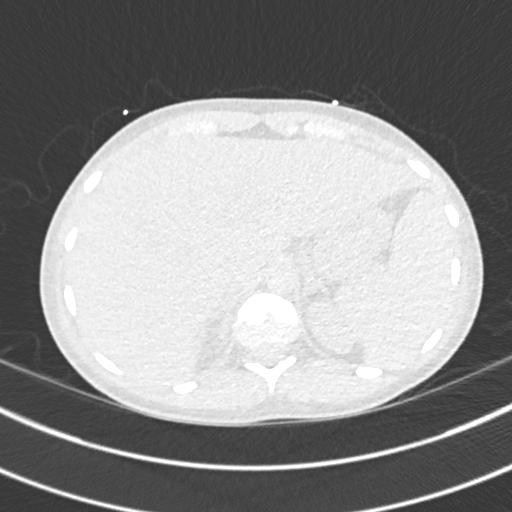
[im 51/408  mediastinal]
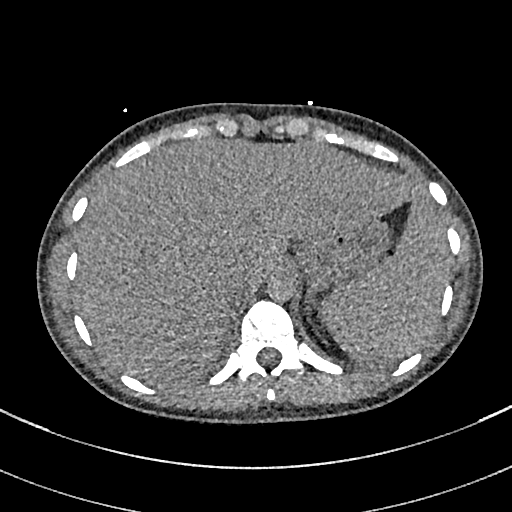
[im 77/408  lung]
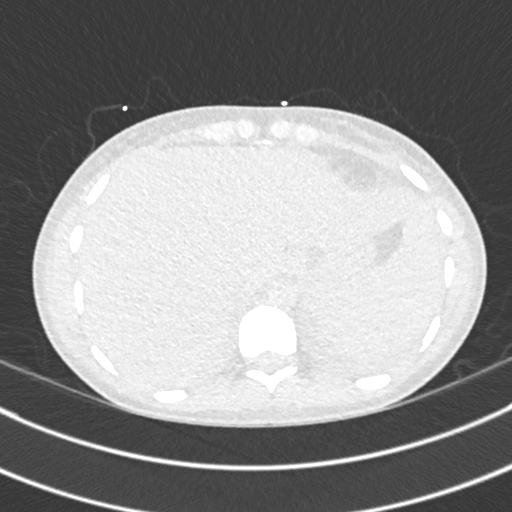
[im 102/408  mediastinal]
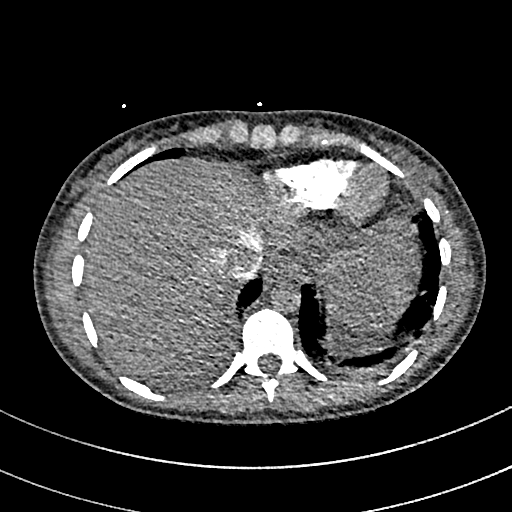
[im 128/408  lung]
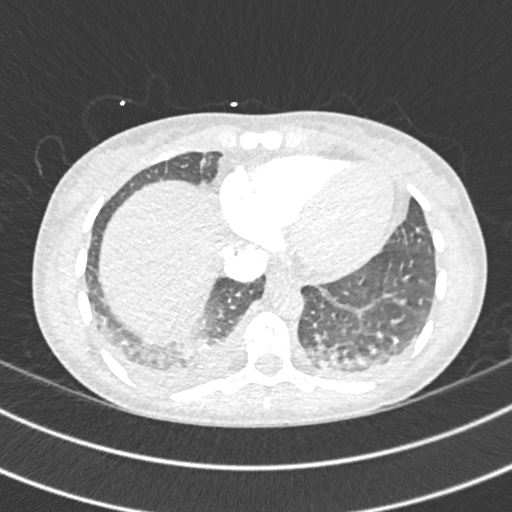
[im 153/408  mediastinal]
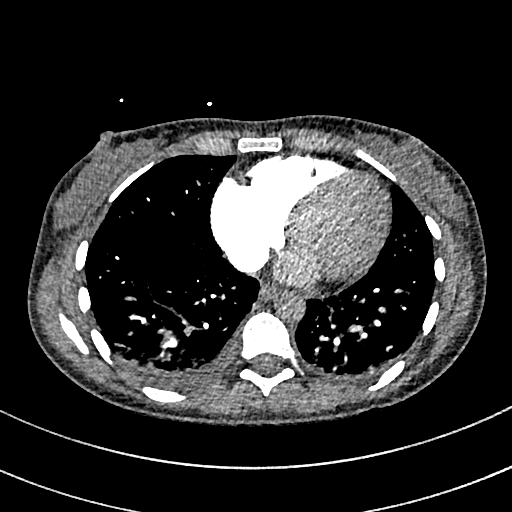
[im 179/408  lung]
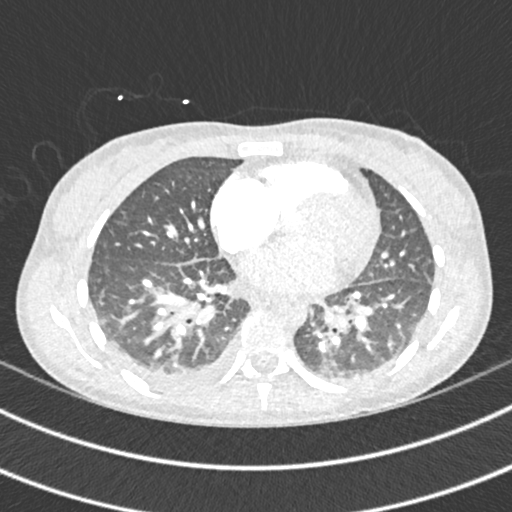
[im 204/408  mediastinal]
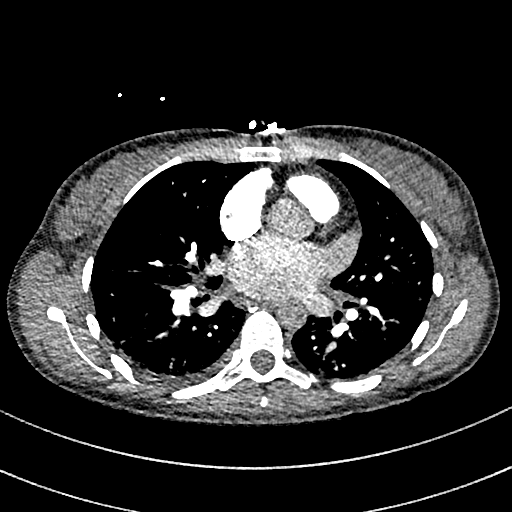
[im 229/408  lung]
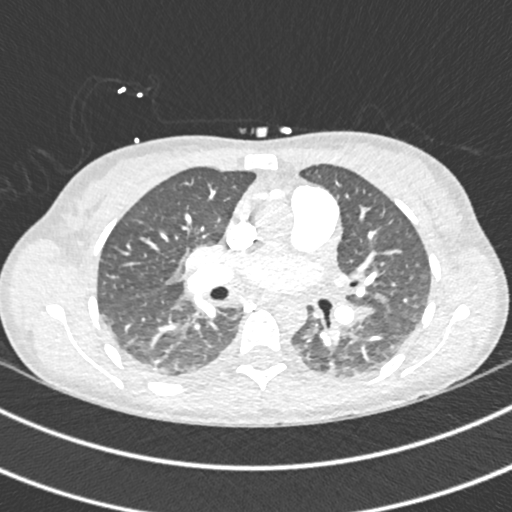
[im 255/408  mediastinal]
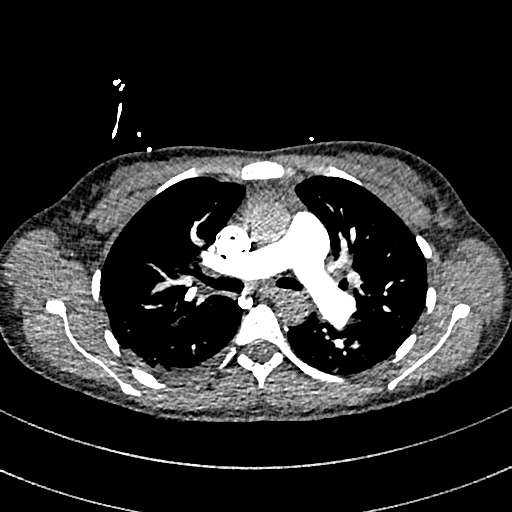
[im 280/408  lung]
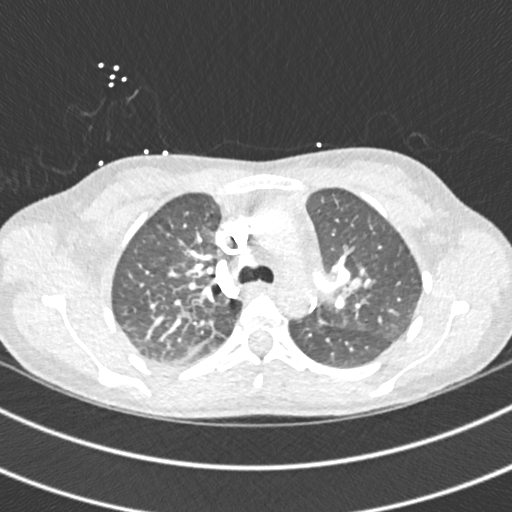
[im 306/408  mediastinal]
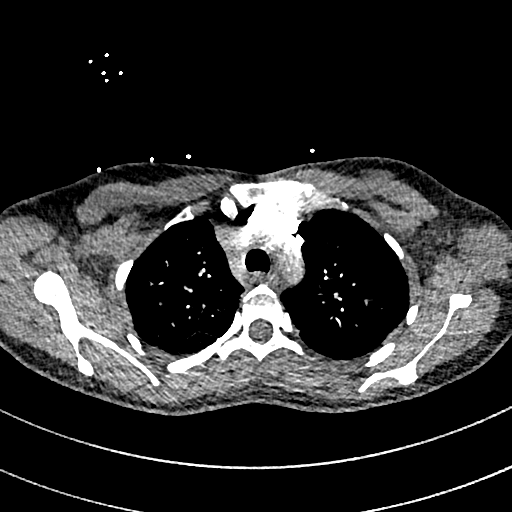
[im 331/408  lung]
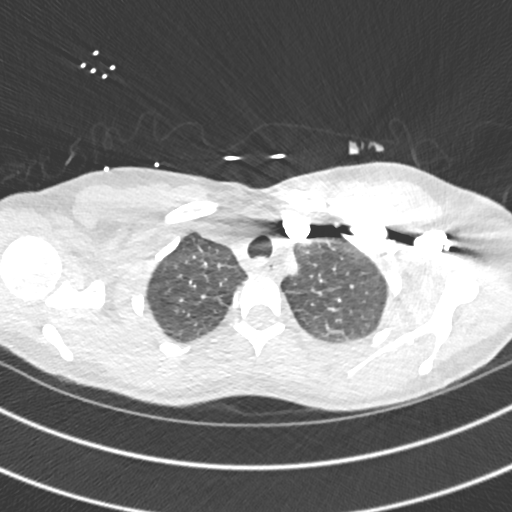
[im 357/408  mediastinal]
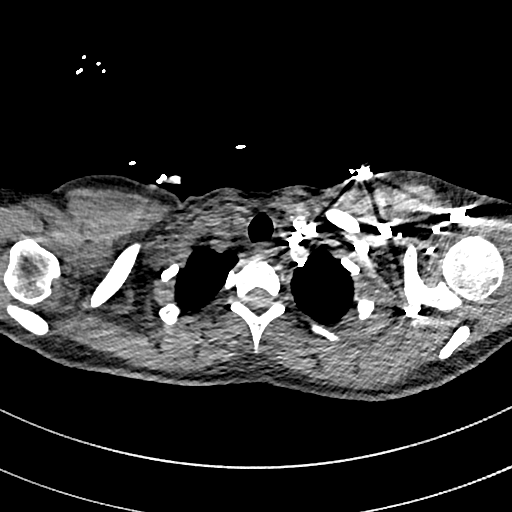
[im 382/408  lung]
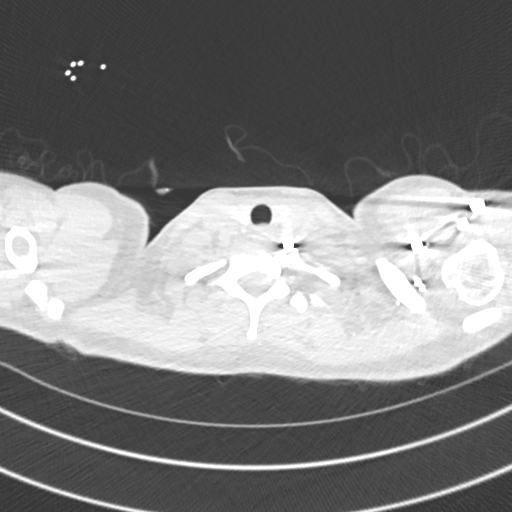

[Series 7: pe 2mm cor · coronal · 0.59mm/px · 1 of 121 slices shown]
[im 61/121  mediastinal]
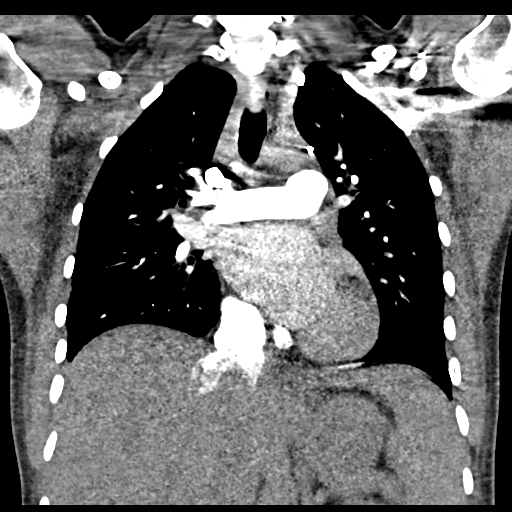

[Series 11: pe lung · axial · 0.63mm/px · z∈[-118,-2]mm · 3 of 117 slices shown]
[im 30/117  mediastinal]
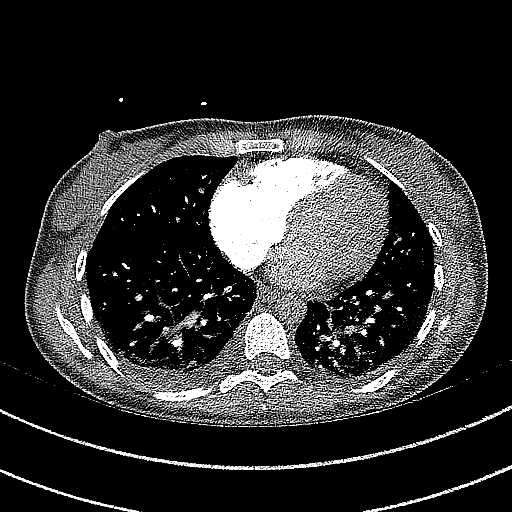
[im 59/117  mediastinal]
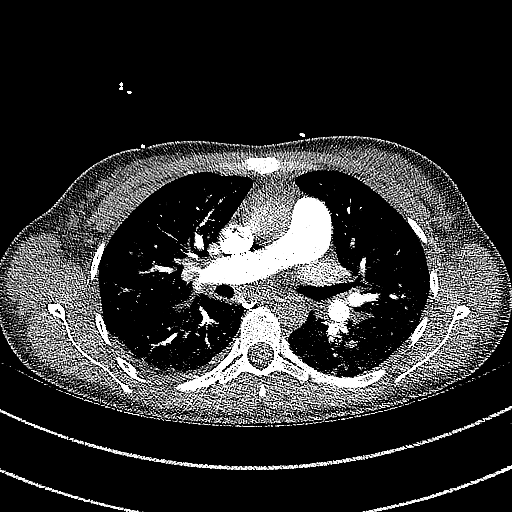
[im 88/117  mediastinal]
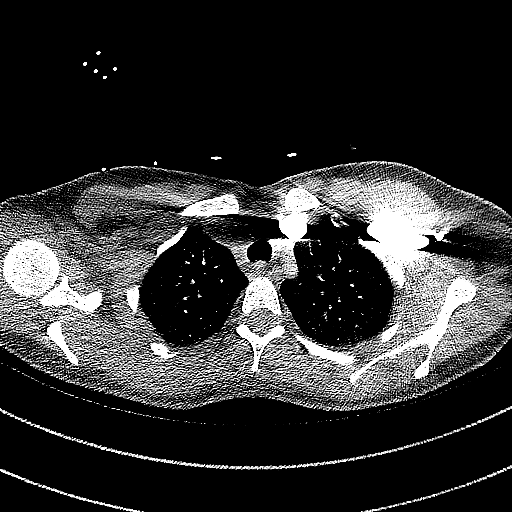

[19 of 36 positions shown; findings below may reference images not displayed]

FINDINGS: Cardiovascular: Excellent contrast bolus timing in the pulmonary
arterial tree.

No focal filling defect identified in the pulmonary arteries to
suggest acute pulmonary embolism.

No left heart or arterial contrast on these images. No cardiomegaly
or pericardial effusion.

Mediastinum/Nodes: Negative.

Lungs/Pleura: Small new layering pleural effusion since yesterday.
Major airways are patent. Lower lung volumes with perihilar and
dependent atelectasis. More confluent posterior basal segment lower
lobe peribronchial opacity, and mild septal thickening in both
lungs. No consolidation.

Upper Abdomen: Negative visible noncontrast upper abdomen.

Musculoskeletal: Negative.

Review of the MIP images confirms the above findings.
IMPRESSION: 1. Negative for acute pulmonary embolus.
2. Small new layering pleural effusions since yesterday. Lower lung
volumes with atelectasis, and possible mild interstitial edema. No
convincing pneumonia at this time.

## 2022-12-01 ENCOUNTER — Encounter: Payer: Self-pay | Admitting: Emergency Medicine

## 2022-12-01 ENCOUNTER — Emergency Department
Admission: EM | Admit: 2022-12-01 | Discharge: 2022-12-01 | Disposition: A | Discharge status: Home / Self Care | Payer: Medicaid Other | Attending: Emergency Medicine | Admitting: Emergency Medicine

## 2022-12-01 ENCOUNTER — Other Ambulatory Visit: Payer: Self-pay

## 2022-12-01 DIAGNOSIS — R42 Dizziness and giddiness: Secondary | ICD-10-CM | POA: Diagnosis present

## 2022-12-01 DIAGNOSIS — R55 Syncope and collapse: Secondary | ICD-10-CM | POA: Diagnosis not present

## 2022-12-01 DIAGNOSIS — Z20822 Contact with and (suspected) exposure to covid-19: Secondary | ICD-10-CM | POA: Insufficient documentation

## 2022-12-01 LAB — URINALYSIS, ROUTINE W REFLEX MICROSCOPIC
Bilirubin Urine: NEGATIVE
Glucose, UA: NEGATIVE mg/dL
Hgb urine dipstick: NEGATIVE
Ketones, ur: NEGATIVE mg/dL
Leukocytes,Ua: NEGATIVE
Nitrite: POSITIVE — AB
Protein, ur: NEGATIVE mg/dL
Specific Gravity, Urine: 1.015 (ref 1.005–1.030)
pH: 7 (ref 5.0–8.0)

## 2022-12-01 LAB — URINE DRUG SCREEN, QUALITATIVE (ARMC ONLY)
Amphetamines, Ur Screen: NOT DETECTED
Barbiturates, Ur Screen: NOT DETECTED
Benzodiazepine, Ur Scrn: NOT DETECTED
Cannabinoid 50 Ng, Ur ~~LOC~~: POSITIVE — AB
Cocaine Metabolite,Ur ~~LOC~~: NOT DETECTED
MDMA (Ecstasy)Ur Screen: NOT DETECTED
Methadone Scn, Ur: NOT DETECTED
Opiate, Ur Screen: NOT DETECTED
Phencyclidine (PCP) Ur S: NOT DETECTED
Tricyclic, Ur Screen: NOT DETECTED

## 2022-12-01 LAB — BASIC METABOLIC PANEL
Anion gap: 8 (ref 5–15)
BUN: 18 mg/dL (ref 6–20)
CO2: 22 mmol/L (ref 22–32)
Calcium: 8.6 mg/dL — ABNORMAL LOW (ref 8.9–10.3)
Chloride: 107 mmol/L (ref 98–111)
Creatinine, Ser: 0.68 mg/dL (ref 0.44–1.00)
GFR, Estimated: >60 mL/min (ref >60)
Glucose, Bld: 94 mg/dL (ref 70–99)
Potassium: 3.7 mmol/L (ref 3.5–5.1)
Sodium: 137 mmol/L (ref 135–145)

## 2022-12-01 LAB — RESP PANEL BY RT-PCR (RSV, FLU A&B, COVID)  RVPGX2
Influenza A by PCR: NEGATIVE
Influenza B by PCR: NEGATIVE
Resp Syncytial Virus by PCR: NEGATIVE
SARS Coronavirus 2 by RT PCR: NEGATIVE

## 2022-12-01 LAB — CBC
HCT: 40.9 % (ref 36.0–46.0)
Hemoglobin: 13 g/dL (ref 12.0–15.0)
MCH: 26.5 pg (ref 26.0–34.0)
MCHC: 31.8 g/dL (ref 30.0–36.0)
MCV: 83.5 fL (ref 80.0–100.0)
Platelets: 203 10*3/uL (ref 150–400)
RBC: 4.9 MIL/uL (ref 3.87–5.11)
RDW: 13.3 % (ref 11.5–15.5)
WBC: 5.4 10*3/uL (ref 4.0–10.5)
nRBC: 0 % (ref 0.0–0.2)

## 2022-12-01 LAB — TSH: TSH: 5.176 u[IU]/mL — ABNORMAL HIGH (ref 0.350–4.500)

## 2022-12-01 MED ORDER — CEPHALEXIN 500 MG PO CAPS: 500.0000 mg | ORAL_CAPSULE | Freq: Two times a day (BID) | ORAL | 0 refills | Status: DC

## 2022-12-01 MED ORDER — SODIUM CHLORIDE 0.9 % IV BOLUS
1000.0000 mL | Freq: Once | INTRAVENOUS | Status: AC
  Administered 2022-12-01: 1000 mL via INTRAVENOUS

## 2022-12-01 NOTE — ED Triage Notes (Signed)
Patient to ED to intermittent dizziness. Patient states it is worse when sitting to stand or standing too quick. NAD noted. AOx4

## 2022-12-01 NOTE — ED Provider Notes (Signed)
[facility] Provider Note    Event Date/Time   First MD Initiated Contact with Patient 12/01/22 [phone]     (approximate)  History   Chief Complaint: Dizziness  HPI  Laurie Richards is a 27 y.o. female with a past medical history of anxiety, gastric reflux, presents to the emergency department for dizziness.  According to the patient she states for her entire life she has had some degree of dizziness which she describes as a drunk feeling when she is walking around especially first upon standing.  She states over the past week or so her symptoms have worsened and now she is feeling more dizzy especially upon standing.  She is also noted that she feels like her pulse rate increases when she stands up.  Patient denies any chest pain or shortness of breath.  Patient states nasal congestion but denies any recent cough vomiting or diarrhea.  Physical Exam   Triage Vital Signs: ED Triage Vitals  Enc Vitals Group     BP 12/01/22 0740 98/70     Pulse Rate 12/01/22 0740 89     Resp 12/01/22 0740 18     Temp 12/01/22 0740 97.7 F (36.5 C)     Temp Source 12/01/22 0740 Oral     SpO2 12/01/22 0740 100 %     Weight 12/01/22 0801 122 lb 5.7 oz (55.5 kg)     Height 12/01/22 0801 5\' 4"  (1.626 m)     Head Circumference --      Peak Flow --      Pain Score 12/01/22 0739 0     Pain Loc --      Pain Edu? --      Excl. in [place]? --     Most recent vital signs: Vitals:   12/01/22 0740  BP: 98/70  Pulse: 89  Resp: 18  Temp: 97.7 F (36.5 C)  SpO2: 100%    General: Awake, no distress.  CV:  Good peripheral perfusion.  Regular rate and rhythm  Resp:  Normal effort.  Equal breath sounds bilaterally.  Abd:  No distention.  Soft, nontender.  No rebound or guarding.   ED Results / Procedures / Treatments   EKG  EKG viewed and interpreted by myself shows a normal sinus rhythm 84 bpm with a narrow QRS, normal axis, normal intervals, no concerning ST  changes.  MEDICATIONS ORDERED IN ED: Medications  sodium chloride 0.9 % bolus 1,000 mL (has no administration in time range)     IMPRESSION / MDM / ASSESSMENT AND PLAN / ED COURSE  I reviewed the triage vital signs and the nursing notes.  Patient's presentation is most consistent with acute presentation with potential threat to life or bodily function.  Patient presents to the emergency department for dizziness.  Patient states this has been ongoing for her entire life but feels worse over the past week or so.  Patient does state nasal congestion but denies any other illnesses.  Patient states her symptoms are worse when she stands up, this is unchanged.  States she feels like her pulse rate increases at times when she stands up as well.  We will check labs, IV hydrate.  Will also obtain a COVID/flu/RSV swab as a precaution.  Urinalysis and pregnancy test.  Patient's chemistry is reassuring including normal renal function and reassuring electrolytes.  CBC is normal.  normal WBC.    Patient's lab work shows what appears to be consistent with urinary tract infection  with many bacteria.  Will cover with Keflex twice daily for the next 7 days.  COVID/flu/RSV is negative.  TSH slightly elevated chemistry is reassuring, CBC reassuring.  Patient is feeling better after fluids.  Will discharge home with cardiology follow-up.  Patient agreeable to plan of care.  FINAL CLINICAL IMPRESSION(S) / ED DIAGNOSES   Near syncope/lightheadedness   Note:  This document was prepared using Dragon voice recognition software and may include unintentional dictation errors.   Harvest Dark, MD 12/01/22 [phone]

## 2022-12-01 NOTE — Discharge Instructions (Addendum)
As we discussed please call the number provided for cardiology to arrange a follow-up appointment for further evaluation.  Please drink plenty of fluids.  Please take your antibiotic as prescribed for its entire course.  Return to the emergency department for any symptom personally concerning to yourself.

## 2022-12-04 ENCOUNTER — Ambulatory Visit: Payer: Medicaid Other | Attending: Cardiovascular Disease | Admitting: Physician Assistant

## 2022-12-04 ENCOUNTER — Ambulatory Visit (INDEPENDENT_AMBULATORY_CARE_PROVIDER_SITE_OTHER): Payer: Medicaid Other

## 2022-12-04 ENCOUNTER — Encounter: Payer: Self-pay | Admitting: Physician Assistant

## 2022-12-04 ENCOUNTER — Ambulatory Visit: Payer: Medicaid Other | Admitting: Cardiovascular Disease

## 2022-12-04 VITALS — BP 93/64 | HR 61 | Ht 65.0 in | Wt 116.0 lb

## 2022-12-04 DIAGNOSIS — I951 Orthostatic hypotension: Secondary | ICD-10-CM

## 2022-12-04 DIAGNOSIS — R002 Palpitations: Secondary | ICD-10-CM

## 2022-12-04 DIAGNOSIS — Z8249 Family history of ischemic heart disease and other diseases of the circulatory system: Secondary | ICD-10-CM

## 2022-12-04 DIAGNOSIS — I428 Other cardiomyopathies: Secondary | ICD-10-CM | POA: Diagnosis not present

## 2022-12-04 DIAGNOSIS — R42 Dizziness and giddiness: Secondary | ICD-10-CM

## 2022-12-04 DIAGNOSIS — I480 Paroxysmal atrial fibrillation: Secondary | ICD-10-CM | POA: Diagnosis not present

## 2022-12-04 DIAGNOSIS — Z87898 Personal history of other specified conditions: Secondary | ICD-10-CM | POA: Diagnosis not present

## 2022-12-04 NOTE — Patient Instructions (Signed)
Medication Instructions:  Your physician recommends that you continue on your current medications as directed. Please refer to the Current Medication list given to you today.  *If you need a refill on your cardiac medications before your next appointment, please call your pharmacy*   Lab Work: None ordered  If you have labs (blood work) drawn today and your tests are completely normal, you will receive your results only by: [place] (if you have MyChart) OR A paper copy in the mail If you have any lab test that is abnormal or we need to change your treatment, we will call you to review the results.   Testing/Procedures: Your physician has requested that you have an echocardiogram. Echocardiography is a painless test that uses sound waves to create images of your heart. It provides your doctor with information about the size and shape of your heart and how well your heart's chambers and valves are working. This procedure takes approximately one hour. There are no restrictions for this procedure. Please do NOT wear cologne, perfume, aftershave, or lotions (deodorant is allowed). Please arrive 15 minutes prior to your appointment time.  Your physician has recommended that you wear a Zio monitor.   This monitor is a medical device that records the heart's electrical activity. Doctors most often use these monitors to diagnose arrhythmias. Arrhythmias are problems with the speed or rhythm of the heartbeat. The monitor is a small device applied to your chest. You can wear one while you do your normal daily activities. While wearing this monitor if you have any symptoms to push the button and record what you felt. Once you have worn this monitor for the period of time provider prescribed (Usually 14 days), you will return the monitor device in the postage paid box. Once it is returned they will download the data collected and provide [place] with a report which the provider will then review and we  will call you with those results. Important tips:  Avoid showering during the first 24 hours of wearing the monitor. Avoid excessive sweating to help maximize wear time. Do not submerge the device, no hot tubs, and no swimming pools. Keep any lotions or oils away from the patch. After 24 hours you may shower with the patch on. Take brief showers with your back facing the shower head.  Do not remove patch once it has been placed because that will interrupt data and decrease adhesive wear time. Push the button when you have any symptoms and write down what you were feeling. Once you have completed wearing your monitor, remove and place into box which has postage paid and place in your outgoing mailbox.  If for some reason you have misplaced your box then call our office and we can provide another box and/or mail it off for you.  Follow-Up: At [facility], you and your health needs are our priority.  As part of our continuing mission to provide you with exceptional heart care, we have created designated Provider Care Teams.  These Care Teams include your primary Cardiologist (physician) and Advanced Practice Providers (APPs -  Physician Assistants and Nurse Practitioners) who all work together to provide you with the care you need, when you need it.  We recommend signing up for the patient portal called "MyChart".  Sign up information is provided on this After Visit Summary.  MyChart is used to connect with patients for Virtual Visits (Telemedicine).  Patients are able to view lab/test results, encounter notes, upcoming appointments,  etc.  Non-urgent messages can be sent to your provider as well.   To learn more about what you can do with MyChart, go to [email or web address].    Your next appointment:   2 month(s)  Provider:   You may see Candee Furbish, MD or one of the following Advanced Practice Providers on your designated Care Team:   Murray Hodgkins, NP Christell Faith, PA-C [place]  Kathlen Mody, PA-C Gerrie Nordmann, NP   Other: Please wear waist-high compression pants and increase water intake as discussed during today's office visit.

## 2022-12-04 NOTE — Progress Notes (Signed)
Cardiology Office Note    Date:  12/04/2022   ID:  Nandana, Krolikowski 03-03-1996, MRN [id number]  PCP:  Marliss Coots, NP  Cardiologist:  Candee Furbish, MD  Electrophysiologist:  None   Chief Complaint: ED follow-up  History of Present Illness:   Laurie Richards is a 27 y.o. 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 coli sepsis secondary to acute pyelonephritis in the setting of obstructive urolithiasis, tobacco use and family history of premature CAD on her mother side with her mother having an MI in her 106s and 2 uncles requiring stenting at a young age who presents for ED follow-up as outlined below.  Was admitted to the hospital in 2018 with palpitations and dyspnea and found to have A-fib with RVR in the setting of possible viral URI and mild hyperthyroidism.  She converted to sinus rhythm with IV digoxin and oral diltiazem.  Echo during that admission showed an EF of 60 to 65% with no regional wall motion abnormalities and normal RV systolic function.  She was admitted to the hospital in 02/2021 with E. coli sepsis secondary to acute pyelonephritis in the setting of obstructive urolithiasis.  She developed A-fib with RVR.  High-sensitivity troponins were mildly elevated and felt to be related to demand ischemia.  EF by echo was reduced to 40 to 45% which was felt to be tachycardia mediated.  Rate control is difficult and limited by hypotension.  She was initially placed on anticoagulation with plans for TEE guided DCCV, though spontaneously converted to sinus rhythm.  She was not maintained on [facility] given lone episode in the setting of acute illness.  She was last seen in the office in 03/2021 for hospital follow-up, maintaining sinus rhythm.  Digoxin was discontinued and Toprol was decreased to 25 mg once daily.  Repeat limited echo was recommended to be  obtained and remains pending.  She was seen in the ED on 12/01/2022 with progressive longstanding dizziness, especially upon standing.  She indicated the symptoms have been ongoing for most of her life.  BP was 98/70 with a heart rate of 89 bpm oxygen saturation 100% on room air.  EKG showed sinus rhythm without acute ST-T changes.  UA consistent with UTI with patient being covered with Keflex and recommendation to follow-up as outpatient.  She comes in today noting a longstanding history of positional dizziness and palpitations that has been worse as of late.  No frank angina or syncope.  With these episodes, there has been some near syncope.  She drinks approximately 4-5 bottles of water daily.  She reports ongoing marijuana and vape use.  She denies other illicit substances or EtOH.  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   Labs independently reviewed: 11/2022 - TSH 5.176, Hgb 13.0, PLT 203, potassium 3.7, BUN 18, serum creatinine 0.68 02/2021 - magnesium 2.2, albumin 2.2, AST/ALT normal  Past Medical History:  Diagnosis Date   Anxiety    Asthma    MILD NO INHALER USE   COVID 2021   LOSS OF TASTE AND SMELL SOB, COUGH X 1 WEEK ALL SYMPTOMS RESOLVED   GERD (gastroesophageal reflux disease)    Headache    MIGRAINES OCC ALSO RARELY   History of kidney stones  Irregular heartbeat    a. occasional skipped beats since age 58.   Marijuana abuse    a. smokes 1-2 x/wk.   PAF (paroxysmal atrial fibrillation) ([place])    a. Dx 09/2017 in setting of mild hyperthyroidism.   Sepsis ([place]) 03-03-2021 to 03-10-2021 in [facility]   with e coli and acute pyelonephrisis   Tobacco abuse    a. smoking cigarettes since age 35.    Past Surgical History:  Procedure Laterality Date   APPENDECTOMY  2018   OPEN   CYSTOSCOPY W/ URETERAL STENT PLACEMENT Right 03/26/2021    Procedure: CYSTOSCOPY WITH RETROGRADE PYELOGRAM/URETROSCOPY/STONE BASKET EXTRACTION/URETERAL STENT REMOVAL;  Surgeon: Alexis Frock, MD;  Location: [facility];  Service: Urology;  Laterality: Right;   CYSTOSCOPY WITH URETEROSCOPY AND STENT PLACEMENT Right 03/04/2021   Procedure: CYSTOSCOPY, RETROGRADE PYELOGRAM, RIGHT STENT PLACEMENT;  Surgeon: Alexis Frock, MD;  Location: [facility];  Service: Urology;  Laterality: Right;   MOUTH SURGERY     1 WISDOM TOOTH PULLED    Current Medications: Current Meds  Medication Sig   acetaminophen (TYLENOL) 325 MG tablet Take 650 mg by mouth every 6 (six) hours as needed.   cephALEXin (KEFLEX) 500 MG capsule Take 1 capsule (500 mg total) by mouth 2 (two) times daily.   cetirizine (ZYRTEC ALLERGY) 10 MG tablet Take 1 tablet (10 mg total) by mouth daily. (Patient taking differently: Take 10 mg by mouth at bedtime.)   famotidine (PEPCID) 20 MG tablet Take 1 tablet (20 mg total) by mouth 2 (two) times daily for 14 days. (Patient taking differently: Take 20 mg by mouth 2 (two) times daily.)   metoprolol succinate (TOPROL-XL) 25 MG 24 hr tablet Take 25 mg by mouth daily as needed.   naproxen (NAPROSYN) 500 MG tablet Take 1 tablet (500 mg total) by mouth 2 (two) times daily.   polyethylene glycol (MIRALAX / GLYCOLAX) 17 g packet Take 17 g by mouth as needed for mild constipation or moderate constipation.   senna (SENOKOT) 8.6 MG TABS tablet Take 2 tablets (17.2 mg total) by mouth daily.    Allergies:   Acetaminophen-codeine, Codeine, and Latex   Social History   Socioeconomic History   Marital status: Single    Spouse name: Not on file   Number of children: Not on file   Years of education: Not on file   Highest education level: Not on file  Occupational History   Not on file  Tobacco Use   Smoking status: Former    Packs/day: 1.00    Years: 8.00    Total pack years: 8.00    Types: Cigarettes    Quit date: 04/09/2020    Years since  quitting: 2.6   Smokeless tobacco: Never  Vaping Use   Vaping Use: Every day   Substances: Nicotine, THC, Flavoring  Substance and Sexual Activity   Alcohol use: Not Currently   Drug use: Yes    Types: Heroin, Methamphetamines, Marijuana    Comment: Last used marijuana at 1:02 pm on 12/04/2022.  HEROIN AND METH LAST USED JUNE  2021 PER PT   Sexual activity: Yes    Birth control/protection: None  Other Topics Concern   Not on file  Social History Narrative   Lives locally with fiance.  Does not routinely exercise.   Social Determinants of Health   Financial Resource Strain: Not on file  Food Insecurity: Not on file  Transportation Needs: Not on file  Physical Activity: Not on file  Stress:  Not on file  Social Connections: Not on file     Family History:  The patient's family history includes Coronary artery disease in her maternal uncle, maternal uncle, and mother; Diabetes in an other family member; Heart failure in her mother.  ROS:   12-point review of systems is negative unless otherwise noted in HPI.   EKGs/Labs/Other Studies Reviewed:    Studies reviewed were summarized above. The additional studies were reviewed today:  2D echo 03/05/2021: 1. Difficult to assess LV function as in Afib with RVR to 140s during  study. Left ventricular ejection fraction, by estimation, is 40 to 45%.  The left ventricle has mildly decreased function. The left ventricle  demonstrates global hypokinesis. There is  mild left ventricular hypertrophy. Left ventricular diastolic parameters  are indeterminate.   2. Right ventricular systolic function is normal. The right ventricular  size is normal. There is normal pulmonary artery systolic pressure. The  estimated right ventricular systolic pressure is 29.0 mmHg.   3. The mitral valve is normal in structure. Mild mitral valve  regurgitation.   4. The aortic valve is tricuspid. Aortic valve regurgitation is not  visualized. No aortic  stenosis is present.   5. The inferior vena cava is dilated in size with <50% respiratory  variability, suggesting right atrial pressure of 15 mmHg.  __________  2D echo 09/23/2017: - Left ventricle: The cavity size was normal. Systolic function was    normal. The estimated ejection fraction was in the range of 60%    to 65%. Wall motion was normal; there were no regional wall    motion abnormalities. The study is not technically sufficient to    allow evaluation of LV diastolic function.  - Left atrium: The atrium was normal in size.  - Right ventricle: Systolic function was normal.  - Pulmonary arteries: Systolic pressure was within the normal    range.    EKG:  EKG is ordered today.  The EKG ordered today demonstrates NSR, 61 bpm, no acute ST-T changes  Recent Labs: 12/01/2022: BUN 18; Creatinine, Ser 0.68; Hemoglobin 13.0; Platelets 203; Potassium 3.7; Sodium 137; TSH 5.176  Recent Lipid Panel No results found for: "CHOL", "TRIG", "HDL", "CHOLHDL", "VLDL", "LDLCALC", "LDLDIRECT"  PHYSICAL EXAM:    VS:  BP 93/64 (BP Location: Left Arm, Patient Position: Sitting, Cuff Size: Normal)   Pulse 61   Ht 5\' 5"  (1.651 m)   Wt 116 lb (52.6 kg)   SpO2 98%   BMI 19.30 kg/m   BMI: Body mass index is 19.3 kg/m.  Physical Exam Vitals reviewed.  Constitutional:      Appearance: She is well-developed.  HENT:     Head: Normocephalic and atraumatic.     Mouth/Throat:     Dentition: Abnormal dentition.  Eyes:     General:        Right eye: No discharge.        Left eye: No discharge.  Neck:     Vascular: No JVD.  Cardiovascular:     Rate and Rhythm: Normal rate and regular rhythm.     Pulses:          Posterior tibial pulses are 2+ on the right side and 2+ on the left side.     Heart sounds: Normal heart sounds, S1 normal and S2 normal. Heart sounds not distant. No midsystolic click and no opening snap. No murmur heard.    No friction rub.  Pulmonary:     Effort: Pulmonary  effort is normal. No respiratory distress.     Breath sounds: Normal breath sounds. No decreased breath sounds, wheezing or rales.  Chest:     Chest wall: No tenderness.  Abdominal:     General: There is no distension.  Musculoskeletal:     Cervical back: Normal range of motion.     Right lower leg: No edema.     Left lower leg: No edema.  Skin:    General: Skin is warm and dry.     Nails: There is no clubbing.  Neurological:     Mental Status: She is alert and oriented to person, place, and time.  Psychiatric:        Speech: Speech normal.        Behavior: Behavior normal.        Thought Content: Thought content normal.        Judgment: Judgment normal.     Wt Readings from Last 3 Encounters:  12/04/22 116 lb (52.6 kg)  12/01/22 122 lb 5.7 oz (55.5 kg)  03/26/21 122 lb 4.8 oz (55.5 kg)     Orthostatic vital signs:  Lying: 102/62, 57 bpm Sitting: 98/65, 82 bpm, lightheaded Standing: 99/64, 98 bpm Standing x 3 minutes: 101/67, 97 bpm, tinnitus  ASSESSMENT & PLAN:   Orthostatic hypotension: BP soft, though stable following chart biopsy.  Positive orthostatics in the office today with heart rate trending from 57 to 82 bpm when transitioning from supine to seated position.  Cannot exclude POTS at this time.  Recommend increasing hydration with water and Gatorade as well as waist high compression pants.  Obtain echo and Zio patch.  May need evaluation by EP.  May need FMLA.  PAF/palpitations: Maintaining sinus rhythm.  Initially diagnosed with A-fib in 2018, presumably in the setting of viral URI and mild hyperthyroidism.  She had recurrence of A-fib in 02/2021 in the setting of acute illness with sepsis secondary to E. coli bacteremia and pyelonephritis with obstructive uropathy.  Given a CHA2DS2-VASc of 1 (sex category), and in the setting of her episodes of A-fib occurring during acute illness, she has not been maintained on anticoagulation.  Obtain Zio patch as outlined above.   If she is found to have recurrence of A-fib, may benefit from EP evaluation for consideration of ablation.  Cardiomyopathy/dyspnea: Euvolemic and well compensated.  Felt to be tachycardia mediated in 2022, in the sitting of Afib with RVR at time of study.  Will obtain echo.  Not currently on GDMT given relative hypotension.  Family history of premature CAD: No symptoms of angina or cardiac decompensation.  May benefit from further primary prevention evaluation including fasting lipid panel for further risk stratification.  History of polysubstance use: Reports ongoing marijuana and vape use.  Complete cessation is encouraged.  Abnormal TSH:  History of mild hyperthyroidism.  TSH elevated during recent ED evaluation.  Possibly contributing to symptoms.  Follow up with PCP.    Disposition: F/u with Dr. Anne Fu or an APP in 2 months.   Medication Adjustments/Labs and Tests Ordered: Current medicines are reviewed at length with the patient today.  Concerns regarding medicines are outlined above. Medication changes, Labs and Tests ordered today are summarized above and listed in the Patient Instructions accessible in Encounters.   Signed, Eula Listen, PA-C 12/04/2022 3:02 PM     [facility] [place] [address] [place], [place] [address] [phone]

## 2022-12-08 DIAGNOSIS — R42 Dizziness and giddiness: Secondary | ICD-10-CM

## 2022-12-08 DIAGNOSIS — R002 Palpitations: Secondary | ICD-10-CM

## 2022-12-10 DIAGNOSIS — Z419 Encounter for procedure for purposes other than remedying health state, unspecified: Secondary | ICD-10-CM | POA: Diagnosis not present

## 2023-01-05 ENCOUNTER — Telehealth: Payer: Self-pay | Admitting: *Deleted

## 2023-01-05 NOTE — Telephone Encounter (Signed)
-----   Message from Emily Filbert, RN sent at 01/04/2023  3:30 PM EST ----- Laurie Richards - notification received from [employer] stating the patient's monitor, ordered by Thurmond Butts on 1/26, is still pending return.

## 2023-01-05 NOTE — Telephone Encounter (Signed)
No answer and voicemail box is full.

## 2023-01-06 NOTE — Telephone Encounter (Signed)
Person answered the phone and requested that we please call her back because she is at work.

## 2023-01-08 DIAGNOSIS — Z419 Encounter for procedure for purposes other than remedying health state, unspecified: Secondary | ICD-10-CM | POA: Diagnosis not present

## 2023-01-12 NOTE — Telephone Encounter (Signed)
No answer/Voicemail box is full.  

## 2023-01-21 ENCOUNTER — Telehealth: Payer: Self-pay

## 2023-01-21 NOTE — Telephone Encounter (Signed)
Mychart msg sent

## 2023-02-02 ENCOUNTER — Ambulatory Visit: Payer: Medicaid Other | Attending: Cardiovascular Disease

## 2023-02-02 ENCOUNTER — Telehealth: Payer: Self-pay | Admitting: *Deleted

## 2023-02-02 NOTE — Telephone Encounter (Signed)
No answer. No voicemail. 

## 2023-02-02 NOTE — Telephone Encounter (Signed)
-----   Message from Rise Mu, PA-C sent at 01/29/2023  7:28 AM EDT ----- Heart monitor showed a predominant rhythm of sinus with occasional episodes of a fast rhythm coming from the top portion of the heart with the longest interval lasting to 6 beats along with rare extra beats to the top and bottom portions of the heart.  No indication for standing medical therapy (BP precludes).

## 2023-02-03 ENCOUNTER — Encounter: Payer: Self-pay | Admitting: *Deleted

## 2023-02-03 NOTE — Progress Notes (Signed)
Letter completed and placed in outgoing mail.  °

## 2023-02-03 NOTE — Telephone Encounter (Signed)
Left voicemail message to call back for review of results.  

## 2023-02-03 NOTE — Telephone Encounter (Signed)
Letter has been done and placed in outgoing mail.

## 2023-02-08 DIAGNOSIS — Z419 Encounter for procedure for purposes other than remedying health state, unspecified: Secondary | ICD-10-CM | POA: Diagnosis not present

## 2023-02-10 ENCOUNTER — Other Ambulatory Visit: Payer: Medicaid Other

## 2023-02-12 NOTE — Progress Notes (Deleted)
Cardiology Office Note    Date:  02/12/2023   ID:  Laurie Richards, DOB 22-Jun-1996, MRN [id number]  PCP:  Lavinia SharpsPlacey, Mary Ann, NP  Cardiologist:  Donato SchultzMark Skains, MD  Electrophysiologist:  None   Chief Complaint: Follow-up  History of Present Illness:   Laurie Richards is a 27 y.o. 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 coli sepsis secondary to acute pyelonephritis in the setting of obstructive urolithiasis, tobacco use and family history of premature CAD on her mother side with her mother having an MI in her 5530s and 2 uncles requiring stenting at a young age who presents for follow-up of Zio patch.   She was admitted to the hospital in 2018 with palpitations and dyspnea and found to have A-fib with RVR in the setting of possible viral URI and mild hyperthyroidism.  She converted to sinus rhythm with IV digoxin and oral diltiazem.  Echo during that admission showed an EF of 60 to 65% with no regional wall motion abnormalities and normal RV systolic function.  She was admitted to the hospital in 02/2021 with E. coli sepsis secondary to acute pyelonephritis in the setting of obstructive urolithiasis.  She developed A-fib with RVR.  High-sensitivity troponins were mildly elevated and felt to be related to demand ischemia.  EF by echo was reduced to 40 to 45% which was felt to be tachycardia mediated.  Rate control is difficult and limited by hypotension.  She was initially placed on anticoagulation with plans for TEE guided DCCV, though spontaneously converted to sinus rhythm.  [facility] in the setting of acute illness.  She was last seen in the office in 03/2021 for hospital follow-up, maintaining sinus rhythm.  Digoxin was discontinued and Toprol was decreased to 25 mg once daily.  Repeat limited echo was recommended to be obtained  and remains pending.   She was seen in the ED on 12/01/2022 with progressive longstanding dizziness, especially upon standing.  She indicated the symptoms have been ongoing for most of her life.  BP was 98/70 with a heart rate of 89 bpm oxygen saturation 100% on room air.  EKG showed sinus rhythm without acute ST-T changes.  UA consistent with UTI.  She was seen in ED follow-up on 12/04/2022 noting a longstanding history of positional dizziness and palpitations with near syncope.  Zio patch showed a predominant rhythm of sinus with an average rate of 86 bpm with a range of 44 to 179 bpm, 25 episodes of SVT occurred with the fastest interval lasting 4 beats with a maximum rate of 169 bpm and the longest interval lasting 6 beats.  SVT was detected with symptomatic patient events.  Echo pending.  ***   Labs independently reviewed: 11/2022 - TSH 5.176, Hgb 13.0, PLT 203, potassium 3.7, BUN 18, serum creatinine 0.68 02/2021 - magnesium 2.2, albumin 2.2, AST/ALT normal  Past Medical History:  Diagnosis Date   Anxiety    Asthma    MILD NO INHALER USE   COVID 2021   LOSS OF TASTE AND SMELL SOB, COUGH X 1 WEEK ALL SYMPTOMS RESOLVED   GERD (gastroesophageal reflux disease)    Headache    MIGRAINES OCC ALSO RARELY   History of kidney stones    Irregular heartbeat    a. occasional skipped beats since age 27.   Marijuana abuse  a. smokes 1-2 x/wk.   PAF (paroxysmal atrial fibrillation) (HCC)    a. Dx 09/2017 in setting of mild hyperthyroidism.   Sepsis (HCC) 03-03-2021 to 03-10-2021 in [facility]   with e coli and acute pyelonephrisis   Tobacco abuse    a. smoking cigarettes since age 49.    Past Surgical History:  Procedure Laterality Date   APPENDECTOMY  2018   OPEN   CYSTOSCOPY W/ URETERAL STENT PLACEMENT Right 03/26/2021   Procedure: CYSTOSCOPY WITH RETROGRADE PYELOGRAM/URETROSCOPY/STONE BASKET EXTRACTION/URETERAL STENT REMOVAL;  Surgeon: Sebastian Ache, MD;  Location: [facility] [place];   Service: Urology;  Laterality: Right;   CYSTOSCOPY WITH URETEROSCOPY AND STENT PLACEMENT Right 03/04/2021   Procedure: CYSTOSCOPY, RETROGRADE PYELOGRAM, RIGHT STENT PLACEMENT;  Surgeon: Sebastian Ache, MD;  Location[facility];  Service: Urology;  Laterality: Right;   MOUTH SURGERY     1 WISDOM TOOTH PULLED    Current Medications: No outpatient medications have been marked as taking for the 02/15/23 encounter (Appointment) with Sondra Barges, PA-C.    Allergies:   Acetaminophen-codeine, Codeine, and Latex   Social History   Socioeconomic History   Marital status: Single    Spouse name: Not on file   Number of children: Not on file   Years of education: Not on file   Highest education level: Not on file  Occupational History   Not on file  Tobacco Use   Smoking status: Former    Packs/day: 1.00    Years: 8.00    Additional pack years: 0.00    Total pack years: 8.00    Types: Cigarettes    Quit date: 04/09/2020    Years since quitting: 2.8   Smokeless tobacco: Never  Vaping Use   Vaping Use: Every day   Substances: Nicotine, THC, Flavoring  Substance and Sexual Activity   Alcohol use: Not Currently   Drug use: Yes    Types: Heroin, Methamphetamines, Marijuana    Comment: Last used marijuana at 1:02 pm on 12/04/2022.  HEROIN AND METH LAST USED JUNE  2021 PER PT   Sexual activity: Yes    Birth control/protection: None  Other Topics Concern   Not on file  Social History Narrative   Lives locally with fiance.  Does not routinely exercise.   Social Determinants of Health   Financial Resource Strain: Not on file  Food Insecurity: Not on file  Transportation Needs: Not on file  Physical Activity: Not on file  Stress: Not on file  Social Connections: Not on file     Family History:  The patient's family history includes Coronary artery disease in her maternal uncle, maternal uncle, and mother; Diabetes in an other family member; Heart failure in her mother.  ROS:   12-point  review of systems is negative unless otherwise noted in the HPI.   EKGs/Labs/Other Studies Reviewed:    Studies reviewed were summarized above. The additional studies were reviewed today:  Zio patch 11/2022:   Sinus rhythm with average HR 86 bpm   Occasional episodes of atrial tachycardia, brief and benign   Rare PAC/PVC   No atrial fibrillation   Symptoms of fluttering or heart racing were associated with sinus rhythm/ mild sinus tachycardia. Normal.     Patch Wear Time:  13 days and 23 hours (2024-01-30T22:31:24-0500 to 2024-02-13T22:31:20-0500)   Patient had a min HR of 44 bpm, max HR of 179 bpm, and avg HR of 86 bpm. Predominant underlying rhythm was Sinus Rhythm. 25 Supraventricular Tachycardia runs  occurred, the run with the fastest interval lasting 4 beats with a max rate of 169 bpm, the  longest lasting 6 beats with an avg rate of 96 bpm. Supraventricular Tachycardia was detected within +/- 45 seconds of symptomatic patient event(s). Isolated SVEs were rare (<1.0%), SVE Couplets were rare (<1.0%), and SVE Triplets were rare (<1.0%).  Isolated VEs were rare (<1.0%), and no VE Couplets or VE Triplets were present. Ventricular Trigeminy was present.  __________  2D echo 03/05/2021: 1. Difficult to assess LV function as in Afib with RVR to 140s during  study. Left ventricular ejection fraction, by estimation, is 40 to 45%.  The left ventricle has mildly decreased function. The left ventricle  demonstrates global hypokinesis. There is  mild left ventricular hypertrophy. Left ventricular diastolic parameters  are indeterminate.   2. Right ventricular systolic function is normal. The right ventricular  size is normal. There is normal pulmonary artery systolic pressure. The  estimated right ventricular systolic pressure is 29.0 mmHg.   3. The mitral valve is normal in structure. Mild mitral valve  regurgitation.   4. The aortic valve is tricuspid. Aortic valve regurgitation is not   visualized. No aortic stenosis is present.   5. The inferior vena cava is dilated in size with <50% respiratory  variability, suggesting right atrial pressure of 15 mmHg.  __________   2D echo 09/23/2017: - Left ventricle: The cavity size was normal. Systolic function was    normal. The estimated ejection fraction was in the range of 60%    to 65%. Wall motion was normal; there were no regional wall    motion abnormalities. The study is not technically sufficient to    allow evaluation of LV diastolic function.  - Left atrium: The atrium was normal in size.  - Right ventricle: Systolic function was normal.  - Pulmonary arteries: Systolic pressure was within the normal    range.    EKG:  EKG is ordered today.  The EKG ordered today demonstrates ***  Recent Labs: 12/01/2022: BUN 18; Creatinine, Ser 0.68; Hemoglobin 13.0; Platelets 203; Potassium 3.7; Sodium 137; TSH 5.176  Recent Lipid Panel No results found for: "CHOL", "TRIG", "HDL", "CHOLHDL", "VLDL", "LDLCALC", "LDLDIRECT"  PHYSICAL EXAM:    VS:  There were no vitals taken for this visit.  BMI: There is no height or weight on file to calculate BMI.  Physical Exam  Wt Readings from Last 3 Encounters:  12/04/22 116 lb (52.6 kg)  12/01/22 122 lb 5.7 oz (55.5 kg)  03/26/21 122 lb 4.8 oz (55.5 kg)     ASSESSMENT & PLAN:   Orthostatic hypotension:  PAF/PSVT/palpitations:  Cardiomyopathy:  Family history of premature CAD:  History of polysubstance use:  Abnormal TSH:   {Are you ordering a CV Procedure (e.g. stress test, cath, DCCV, TEE, etc)?   Press F2        :[id number]}     Disposition: F/u with Dr. Anne Fu or an APP in ***.   Medication Adjustments/Labs and Tests Ordered: Current medicines are reviewed at length with the patient today.  Concerns regarding medicines are outlined above. Medication changes, Labs and Tests ordered today are summarized above and listed in the Patient Instructions accessible in  Encounters.   Signed, Eula Listen, PA-C 02/12/2023 5:00 PM     [facility] [address] [place], [place] [address] [phone]

## 2023-02-15 ENCOUNTER — Ambulatory Visit: Payer: Medicaid Other | Attending: Physician Assistant | Admitting: Physician Assistant

## 2023-02-16 ENCOUNTER — Encounter: Payer: Self-pay | Admitting: Physician Assistant

## 2023-02-25 ENCOUNTER — Emergency Department: Payer: Medicaid Other

## 2023-02-25 DIAGNOSIS — Z041 Encounter for examination and observation following transport accident: Secondary | ICD-10-CM | POA: Diagnosis not present

## 2023-02-25 DIAGNOSIS — Z23 Encounter for immunization: Secondary | ICD-10-CM | POA: Insufficient documentation

## 2023-02-25 DIAGNOSIS — E039 Hypothyroidism, unspecified: Secondary | ICD-10-CM | POA: Diagnosis not present

## 2023-02-25 DIAGNOSIS — S20211A Contusion of right front wall of thorax, initial encounter: Secondary | ICD-10-CM | POA: Diagnosis not present

## 2023-02-25 DIAGNOSIS — Y92410 Unspecified street and highway as the place of occurrence of the external cause: Secondary | ICD-10-CM | POA: Insufficient documentation

## 2023-02-25 DIAGNOSIS — F17210 Nicotine dependence, cigarettes, uncomplicated: Secondary | ICD-10-CM | POA: Insufficient documentation

## 2023-02-25 DIAGNOSIS — S60211A Contusion of right wrist, initial encounter: Secondary | ICD-10-CM | POA: Diagnosis not present

## 2023-02-25 DIAGNOSIS — J45909 Unspecified asthma, uncomplicated: Secondary | ICD-10-CM | POA: Diagnosis not present

## 2023-02-25 DIAGNOSIS — S60051A Contusion of right little finger without damage to nail, initial encounter: Secondary | ICD-10-CM | POA: Insufficient documentation

## 2023-02-25 DIAGNOSIS — R1011 Right upper quadrant pain: Secondary | ICD-10-CM | POA: Insufficient documentation

## 2023-02-25 DIAGNOSIS — R079 Chest pain, unspecified: Secondary | ICD-10-CM | POA: Diagnosis present

## 2023-02-25 DIAGNOSIS — R0789 Other chest pain: Secondary | ICD-10-CM | POA: Diagnosis not present

## 2023-02-25 DIAGNOSIS — S80811A Abrasion, right lower leg, initial encounter: Secondary | ICD-10-CM | POA: Diagnosis not present
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
## 2023-02-26 ENCOUNTER — Emergency Department: Payer: Medicaid Other

## 2023-02-26 ENCOUNTER — Emergency Department
Admission: EM | Admit: 2023-02-26 | Discharge: 2023-02-26 | Disposition: A | Discharge status: Home / Self Care | Payer: Medicaid Other | Attending: Emergency Medicine | Admitting: Emergency Medicine

## 2023-02-26 DIAGNOSIS — Z041 Encounter for examination and observation following transport accident: Secondary | ICD-10-CM | POA: Diagnosis not present

## 2023-02-26 DIAGNOSIS — T07XXXA Unspecified multiple injuries, initial encounter: Secondary | ICD-10-CM

## 2023-02-26 DIAGNOSIS — R0789 Other chest pain: Secondary | ICD-10-CM

## 2023-02-26 LAB — COMPREHENSIVE METABOLIC PANEL
ALT: 34 U/L (ref 0–44)
AST: 31 U/L (ref 15–41)
Albumin: 4 g/dL (ref 3.5–5.0)
Alkaline Phosphatase: 72 U/L (ref 38–126)
Anion gap: 7 (ref 5–15)
BUN: 20 mg/dL (ref 6–20)
CO2: 26 mmol/L (ref 22–32)
Calcium: 8.8 mg/dL — ABNORMAL LOW (ref 8.9–10.3)
Chloride: 104 mmol/L (ref 98–111)
Creatinine, Ser: 0.64 mg/dL (ref 0.44–1.00)
GFR, Estimated: >60 mL/min (ref >60)
Glucose, Bld: 106 mg/dL — ABNORMAL HIGH (ref 70–99)
Potassium: 3.4 mmol/L — ABNORMAL LOW (ref 3.5–5.1)
Sodium: 137 mmol/L (ref 135–145)
Total Bilirubin: 0.3 mg/dL (ref 0.3–1.2)
Total Protein: 7.8 g/dL (ref 6.5–8.1)

## 2023-02-26 LAB — URINALYSIS, ROUTINE W REFLEX MICROSCOPIC
Bilirubin Urine: NEGATIVE
Glucose, UA: NEGATIVE mg/dL
Ketones, ur: NEGATIVE mg/dL
Leukocytes,Ua: NEGATIVE
Nitrite: NEGATIVE
Protein, ur: NEGATIVE mg/dL
Specific Gravity, Urine: 1.016 (ref 1.005–1.030)
pH: 7 (ref 5.0–8.0)

## 2023-02-26 LAB — CBC
HCT: 41.4 % (ref 36.0–46.0)
Hemoglobin: 13.3 g/dL (ref 12.0–15.0)
MCH: 26 pg (ref 26.0–34.0)
MCHC: 32.1 g/dL (ref 30.0–36.0)
MCV: 80.9 fL (ref 80.0–100.0)
Platelets: 235 10*3/uL (ref 150–400)
RBC: 5.12 MIL/uL — ABNORMAL HIGH (ref 3.87–5.11)
RDW: 13.5 % (ref 11.5–15.5)
WBC: 9.9 10*3/uL (ref 4.0–10.5)
nRBC: 0 % (ref 0.0–0.2)

## 2023-02-26 LAB — ETHANOL: Alcohol, Ethyl (B): <10 mg/dL (ref <10)

## 2023-02-26 LAB — POC URINE PREG, ED: Preg Test, Ur: NEGATIVE

## 2023-02-26 MED ORDER — IBUPROFEN 800 MG PO TABS: 800.0000 mg | ORAL_TABLET | Freq: Three times a day (TID) | ORAL | 0 refills | Status: DC | PRN

## 2023-02-26 MED ORDER — MORPHINE SULFATE (PF) 4 MG/ML IV SOLN
4.0000 mg | Freq: Once | INTRAVENOUS | Status: AC
  Administered 2023-02-26: 4 mg via INTRAVENOUS
  Filled 2023-02-26: qty 1

## 2023-02-26 MED ORDER — BACITRACIN ZINC 500 UNIT/GM EX OINT
TOPICAL_OINTMENT | Freq: Once | CUTANEOUS | Status: AC
  Filled 2023-02-26: qty 0.9

## 2023-02-26 MED ORDER — TETANUS-DIPHTH-ACELL PERTUSSIS 5-2.5-18.5 LF-MCG/0.5 IM SUSY
0.5000 mL | PREFILLED_SYRINGE | Freq: Once | INTRAMUSCULAR | Status: AC
  Administered 2023-02-26: 0.5 mL via INTRAMUSCULAR
  Filled 2023-02-26: qty 0.5

## 2023-02-26 MED ORDER — SODIUM CHLORIDE 0.9 % IV BOLUS (SEPSIS)
1000.0000 mL | Freq: Once | INTRAVENOUS | Status: AC
  Administered 2023-02-26: 1000 mL via INTRAVENOUS

## 2023-02-26 MED ORDER — ONDANSETRON HCL 4 MG/2ML IJ SOLN
4.0000 mg | Freq: Once | INTRAMUSCULAR | Status: AC
  Administered 2023-02-26: 4 mg via INTRAVENOUS
  Filled 2023-02-26: qty 2

## 2023-02-26 MED ORDER — ONDANSETRON 4 MG PO TBDP: 4.0000 mg | ORAL_TABLET | Freq: Three times a day (TID) | ORAL | 0 refills | Status: AC | PRN

## 2023-02-26 MED ORDER — HYDROCODONE-ACETAMINOPHEN 5-325 MG PO TABS: 2.0000 | ORAL_TABLET | Freq: Four times a day (QID) | ORAL | 0 refills | Status: AC | PRN

## 2023-02-26 MED ORDER — IOHEXOL 300 MG/ML  SOLN
100.0000 mL | Freq: Once | INTRAMUSCULAR | Status: AC | PRN
  Administered 2023-02-26: 100 mL via INTRAVENOUS

## 2023-02-26 NOTE — Discharge Instructions (Addendum)
You are being provided a prescription for opiates (also known as narcotics) for pain control.  Opiates can be addictive and should only be used when absolutely necessary for pain control when other alternatives do not work.  We recommend you only use them for the recommended amount of time and only as prescribed.  Please do not take with other sedative medications or alcohol.  Please do not drive, operate machinery, make important decisions while taking opiates.  Please note that these medications can be addictive and have high abuse potential.  Patients can become addicted to narcotics after only taking them for a few days.  Please keep these medications locked away from children, teenagers or any family members with history of substance abuse.  Narcotic pain medicine may also make you constipated.  You may use over-the-counter medications such as MiraLAX, Colace to prevent constipation.  If you become constipated, you may use over-the-counter enemas as needed.  Itching and nausea are also common side effects of narcotic pain medication.  If you develop uncontrolled vomiting or a rash, please stop these medications and seek medical care.  Please go to the following website to schedule new (and existing) patient appointments:   [email or web address]   The following is a list of primary care offices in the area who are accepting new patients at this time.  Please reach out to one of them directly and let them know you would like to schedule an appointment to follow up on an Emergency Department visit, and/or to establish a new primary care provider (PCP).  There are likely other primary care clinics in the are who are accepting new patients, but this is an excellent place to start:  [facility] Lead physician: Dr Angela Bacigalupo [address] [place], [place] [address] [phone]  [facility] Lead Physician: Dr Krichna Sowles [address], [place], [place] [address] [phone]  [facility]  Lead Physician: Dr Megan Johnson [address], [place], [place] [address] [phone]  [facility] [place] [facility] Lead Physician: Dr Alex Karamalegos [address], [place],  [address] [phone]  [facility] [place] Lead Physician: Dr Laura Berglund [address], [place],  [address] [phone]  
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
## 2023-03-01 ENCOUNTER — Emergency Department (HOSPITAL_COMMUNITY)
Admission: EM | Admit: 2023-03-01 | Discharge: 2023-03-01 | Disposition: A | Discharge status: Home / Self Care | Payer: Medicaid Other | Attending: Emergency Medicine | Admitting: Emergency Medicine

## 2023-03-01 ENCOUNTER — Encounter (HOSPITAL_COMMUNITY): Payer: Self-pay

## 2023-03-01 DIAGNOSIS — S161XXA Strain of muscle, fascia and tendon at neck level, initial encounter: Secondary | ICD-10-CM | POA: Diagnosis not present

## 2023-03-01 DIAGNOSIS — M5412 Radiculopathy, cervical region: Secondary | ICD-10-CM | POA: Diagnosis not present

## 2023-03-01 DIAGNOSIS — J45909 Unspecified asthma, uncomplicated: Secondary | ICD-10-CM | POA: Insufficient documentation

## 2023-03-01 DIAGNOSIS — E039 Hypothyroidism, unspecified: Secondary | ICD-10-CM | POA: Diagnosis not present

## 2023-03-01 DIAGNOSIS — M542 Cervicalgia: Secondary | ICD-10-CM | POA: Diagnosis present

## 2023-03-01 DIAGNOSIS — Z91040 Latex allergy status: Secondary | ICD-10-CM | POA: Insufficient documentation

## 2023-03-01 DIAGNOSIS — Y92410 Unspecified street and highway as the place of occurrence of the external cause: Secondary | ICD-10-CM | POA: Diagnosis not present

## 2023-03-01 MED ORDER — NAPROXEN 500 MG PO TABS: 500.0000 mg | ORAL_TABLET | Freq: Two times a day (BID) | ORAL | 0 refills | Status: DC

## 2023-03-01 MED ORDER — CYCLOBENZAPRINE HCL 10 MG PO TABS: 10.0000 mg | ORAL_TABLET | Freq: Two times a day (BID) | ORAL | 0 refills | Status: AC | PRN

## 2023-03-01 MED ORDER — OXYCODONE-ACETAMINOPHEN 5-325 MG PO TABS: 1.0000 | ORAL_TABLET | Freq: Once | ORAL | Status: DC

## 2023-03-01 MED ORDER — CYCLOBENZAPRINE HCL 10 MG PO TABS
5.0000 mg | ORAL_TABLET | Freq: Once | ORAL | Status: AC
  Administered 2023-03-01: 5 mg via ORAL
  Filled 2023-03-01: qty 1

## 2023-03-01 MED ORDER — LIDOCAINE 4 % EX PTCH: 1.0000 | MEDICATED_PATCH | CUTANEOUS | 0 refills | Status: AC

## 2023-03-01 MED ORDER — KETOROLAC TROMETHAMINE 60 MG/2ML IM SOLN
30.0000 mg | Freq: Once | INTRAMUSCULAR | Status: AC
  Administered 2023-03-01: 30 mg via INTRAMUSCULAR
  Filled 2023-03-01: qty 2

## 2023-03-01 MED ORDER — LIDOCAINE 5 % EX PTCH
1.0000 | MEDICATED_PATCH | CUTANEOUS | Status: DC
  Administered 2023-03-01: 1 via TRANSDERMAL
  Filled 2023-03-01: qty 1
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
## 2023-03-01 NOTE — ED Triage Notes (Signed)
Pt c/o continued/ worsening neck pain, especially "when I swallow- it hurts in my back." Advises limited ROM on both sides. Also c/o CP, worse w inspiration. Pt states she has been compliant w medications, "but I think because the swelling has come down, it's getting worse."

## 2023-03-01 NOTE — Discharge Instructions (Addendum)
Please follow-up with a primary care provider for further management.  A c-collar has been placed for comfort.  Your symptoms are consistent with cervical strain and cervical radiculopathy which can be due to a bulging disc.  Will prescribe Flexeril, continue to take NSAIDs for pain control.
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
## 2023-03-01 NOTE — Progress Notes (Signed)
Orthopedic Tech Progress Note Patient Details:  Laurie Richards 1996-10-10 [id number]  Ortho Devices Type of Ortho Device: Soft collar Ortho Device/Splint Interventions: Ordered, Application, Adjustment   Post Interventions Patient Tolerated: Well Instructions Provided: Care of device, Adjustment of device  [name] Post 03/01/2023, 10:18 PM

## 2023-03-10 DIAGNOSIS — Z419 Encounter for procedure for purposes other than remedying health state, unspecified: Secondary | ICD-10-CM | POA: Diagnosis not present

## 2023-03-28 ENCOUNTER — Emergency Department
Admission: EM | Admit: 2023-03-28 | Discharge: 2023-03-28 | Disposition: A | Discharge status: Home / Self Care | Payer: Medicaid Other | Attending: Emergency Medicine | Admitting: Emergency Medicine

## 2023-03-28 ENCOUNTER — Other Ambulatory Visit: Payer: Self-pay

## 2023-03-28 ENCOUNTER — Emergency Department: Payer: Medicaid Other

## 2023-03-28 DIAGNOSIS — R200 Anesthesia of skin: Secondary | ICD-10-CM | POA: Diagnosis not present

## 2023-03-28 DIAGNOSIS — S92424A Nondisplaced fracture of distal phalanx of right great toe, initial encounter for closed fracture: Secondary | ICD-10-CM | POA: Diagnosis not present

## 2023-03-28 DIAGNOSIS — W228XXA Striking against or struck by other objects, initial encounter: Secondary | ICD-10-CM | POA: Diagnosis not present

## 2023-03-28 DIAGNOSIS — S92414A Nondisplaced fracture of proximal phalanx of right great toe, initial encounter for closed fracture: Secondary | ICD-10-CM | POA: Diagnosis not present

## 2023-03-28 DIAGNOSIS — S99921A Unspecified injury of right foot, initial encounter: Secondary | ICD-10-CM | POA: Diagnosis present

## 2023-03-28 NOTE — Discharge Instructions (Signed)
You were seen for toe fracture.  Take ibuprofen and Tylenol for pain as needed.  Apply ice to affected area for swelling.  Follow-up with orthopedics for further evaluation and management.

## 2023-03-28 NOTE — ED Provider Notes (Signed)
[facility] Emergency Department Provider Note     Event Date/Time   First MD Initiated Contact with Patient 03/28/23 1622     (approximate)   History   Toe Injury   HPI  Laurie Richards is a 27 y.o. female with no significant past medical history who presents to the emergency department for evaluation of a toe injury x 4 hours ago.  Patient reports being upset and impulsively kicking her right great toe on the corner of her couch. She reports immediate localized pain and swelling.  she endorses numbness.  No other complaint at this time.      Physical Exam   Triage Vital Signs: ED Triage Vitals  Enc Vitals Group     BP 03/28/23 1457 114/79     Pulse Rate 03/28/23 1457 93     Resp 03/28/23 1457 20     Temp 03/28/23 1457 98.2 F (36.8 C)     Temp Source 03/28/23 1457 Oral     SpO2 03/28/23 1457 100 %     Weight 03/28/23 1455 108 lb 0.4 oz (49 kg)     Height 03/28/23 1455 5\' 5"  (1.651 m)     Head Circumference --      Peak Flow --      Pain Score 03/28/23 1454 9     Pain Loc --      Pain Edu? --      Excl. in GC? --     Most recent vital signs: Vitals:   03/28/23 1457  BP: 114/79  Pulse: 93  Resp: 20  Temp: 98.2 F (36.8 C)  SpO2: 100%    General Awake, no distress. INAD. HEENT NCAT. PERRL. EOMI.  CV:  Good peripheral perfusion.  RESP:  Normal effort.  ABD:  No distention.  Other:  Skin is dry clean and intact.  Tender to palpation upon right distal 2/3  hallux. CRT <2sec. absent active range of motion secondary to pain.  Full passive range of motion limited due to pain of all right metatarsal and phalangeal joints.  Neurovascular status intact.  Gait steady with a limp.  ED Results / Procedures / Treatments   Labs (all labs ordered are listed, but only abnormal results are displayed) Labs Reviewed - No data to display  RADIOLOGY  I personally viewed and evaluated these images as part of my medical decision making, as well  as reviewing the written report by the radiologist.  ED Provider Interpretation: There is a fracture of the proximal phalanx of the great toe.  Nondisplaced.  DG Foot Complete Right  Result Date: 03/28/2023 CLINICAL DATA:  Trauma to the great toe EXAM: RIGHT FOOT COMPLETE - 3 VIEW COMPARISON:  None Available. FINDINGS: Nondisplaced intra-articular fracture of the great toe proximal phalanx extending from the metatarsal phalangeal to the interphalangeal joint. IMPRESSION: Nondisplaced intra-articular fracture of the great toe proximal phalanx. Electronically Signed   By: Agustin Cree M.D.   On: 03/28/2023 15:28     PROCEDURES:  Critical Care performed: No  Procedures   MEDICATIONS ORDERED IN ED: Medications - No data to display   IMPRESSION / MDM / ASSESSMENT AND PLAN / ED COURSE  I reviewed the triage vital signs and the nursing notes.                              Differential diagnosis includes, but is not limited to, fracture, sprain,  location.  Patient's presentation is most consistent with acute complicated illness / injury requiring diagnostic workup.  27 year old female presents to the emergency department for evaluation of an acute toe injury after impulsively kicking her couch.  See HPI for further details.  X-ray reveals a intra-articular nondisplaced fracture of the great toe on the right foot.  She will need a orthopedic follow-up for further evaluation and management.  She will be given a hard sole postop shoe and crutches at discharge.  I will buddy tape the injured site for discharge.  I discussed this with patient and she is in full agreement and understanding of assessment and plan.  Patient is in satisfactory and stable condition for discharge.  Patient is educated and encouraged to take Tylenol and ibuprofen as needed for pain and to follow-up with orthopedics the next day.  Patient's diagnosis is consistent with closed nondisplaced fracture of proximal phalanx of  right great toe. Patient is given ED precautions to return to the ED for any worsening or new symptoms.    FINAL CLINICAL IMPRESSION(S) / ED DIAGNOSES   Final diagnoses:  Closed nondisplaced fracture of proximal phalanx of right great toe, initial encounter     Rx / DC Orders   ED Discharge Orders     None        Note:  This document was prepared using Dragon voice recognition software and may include unintentional dictation errors.    Romeo Apple, Raeley Gilmore A, PA-C 03/28/23 1757    Concha Se, MD 03/28/23 [phone]

## 2023-03-28 NOTE — ED Triage Notes (Signed)
Pt reports had on closed toed shoes and kicked a couch corner about 30 minutes and ago and now has swelling, pain and numbness to right foot great toe.

## 2023-03-29 DIAGNOSIS — S92414A Nondisplaced fracture of proximal phalanx of right great toe, initial encounter for closed fracture: Secondary | ICD-10-CM | POA: Diagnosis not present

## 2023-04-10 DIAGNOSIS — Z419 Encounter for procedure for purposes other than remedying health state, unspecified: Secondary | ICD-10-CM | POA: Diagnosis not present

## 2023-04-12 DIAGNOSIS — M79674 Pain in right toe(s): Secondary | ICD-10-CM | POA: Diagnosis not present

## 2023-04-12 DIAGNOSIS — S92414D Nondisplaced fracture of proximal phalanx of right great toe, subsequent encounter for fracture with routine healing: Secondary | ICD-10-CM | POA: Diagnosis not present

## 2023-04-26 DIAGNOSIS — S92414D Nondisplaced fracture of proximal phalanx of right great toe, subsequent encounter for fracture with routine healing: Secondary | ICD-10-CM | POA: Diagnosis not present

## 2023-05-08 ENCOUNTER — Other Ambulatory Visit: Payer: Self-pay

## 2023-05-08 ENCOUNTER — Emergency Department
Admission: EM | Admit: 2023-05-08 | Discharge: 2023-05-08 | Disposition: A | Discharge status: Home / Self Care | Payer: Medicaid Other | Attending: Emergency Medicine | Admitting: Emergency Medicine

## 2023-05-08 DIAGNOSIS — K029 Dental caries, unspecified: Secondary | ICD-10-CM | POA: Insufficient documentation

## 2023-05-08 DIAGNOSIS — K0889 Other specified disorders of teeth and supporting structures: Secondary | ICD-10-CM | POA: Diagnosis not present

## 2023-05-08 DIAGNOSIS — J45909 Unspecified asthma, uncomplicated: Secondary | ICD-10-CM | POA: Diagnosis not present

## 2023-05-08 MED ORDER — AMOXICILLIN 875 MG PO TABS: 875.0000 mg | ORAL_TABLET | Freq: Two times a day (BID) | ORAL | 0 refills | Status: DC

## 2023-05-08 MED ORDER — NAPROXEN 500 MG PO TABS: 500.0000 mg | ORAL_TABLET | Freq: Two times a day (BID) | ORAL | 0 refills | Status: AC

## 2023-05-08 MED ORDER — LIDOCAINE VISCOUS HCL 2 % MT SOLN: 10.0000 mL | OROMUCOSAL | 0 refills | Status: AC | PRN

## 2023-05-08 MED ORDER — LIDOCAINE VISCOUS HCL 2 % MT SOLN: 10.0000 mL | OROMUCOSAL | 0 refills | Status: DC | PRN

## 2023-05-08 MED ORDER — AMOXICILLIN 875 MG PO TABS: 875.0000 mg | ORAL_TABLET | Freq: Two times a day (BID) | ORAL | 0 refills | Status: AC

## 2023-05-08 MED ORDER — NAPROXEN 500 MG PO TABS: 500.0000 mg | ORAL_TABLET | Freq: Two times a day (BID) | ORAL | 0 refills | Status: DC

## 2023-05-08 NOTE — Discharge Instructions (Addendum)
Rinse your mouth often with salt water. To make salt water, dissolve -1 tsp (3-6 g) of salt in 1 cup (237 mL) of warm water  you have been prescribed antibiotics, take them exactly as directed. Do not stop taking them because your symptoms have improved.  T

## 2023-05-08 NOTE — ED Triage Notes (Signed)
Pt to ed from home via POV for dental pain x 2 days. Pt is caox4, in no acute distress and ambulatory in triage.

## 2023-05-08 NOTE — ED Provider Notes (Signed)
[facility] Emergency Department Provider Note     Event Date/Time   First MD Initiated Contact with Patient 05/08/23 1811     (approximate)   History   Dental Pain (X 2 days right upper and lower)   HPI  Laurie Richards is a 27 y.o. female with a past medical history of tobacco abuse, and asthma who presents to ED for evaluation of right-sided lower and upper dental pain x 2 days.  Patient admits to poor dental hygiene and having similar symptoms in the past.  Patient denies having primary dentist at this time.  Patient states she has taken Tylenol and ibuprofen with minimal relief.  Patient also reports she has taken 2 amoxicillin pills of an old prescription which has helped with the swelling.  Patient denies fever, painful swallowing, shortness of breath.  Patient is requesting for antibiotics.  No other complaints at this time.     Physical Exam   Triage Vital Signs: ED Triage Vitals  Enc Vitals Group     BP 05/08/23 1811 107/80     Pulse Rate 05/08/23 1811 (!) 110     Resp 05/08/23 1811 16     Temp 05/08/23 1811 98.3 F (36.8 C)     Temp Source 05/08/23 1811 Oral     SpO2 05/08/23 1811 98 %     Weight 05/08/23 1809 120 lb (54.4 kg)     Height 05/08/23 1809 5\' 5"  (1.651 m)     Head Circumference --      Peak Flow --      Pain Score 05/08/23 1809 8     Pain Loc --      Pain Edu? --      Excl. in GC? --     Most recent vital signs: Vitals:   05/08/23 1811  BP: 107/80  Pulse: (!) 110  Resp: 16  Temp: 98.3 F (36.8 C)  SpO2: 98%    General Awake, no distress.  Nontoxic.  HEENT NCAT.  No facial swelling.  PERRL. EOMI. No rhinorrhea. Mucous membranes are moist.  CV:  Good peripheral perfusion.  RESP:  Normal effort.  ABD:  No distention.  Throat: Oropharynx clear. Tonsils not enlarged.  Airway is patent. Neck:   No cervical lymphadenopathy.  CV:  Good peripheral perfusion.  RESP:  Normal effort.  OTHER:  Poor dentition. Mild  upper and lower gingivitis. Multiple dental caries. No abscess.     ED Results / Procedures / Treatments   Labs (all labs ordered are listed, but only abnormal results are displayed) Labs Reviewed - No data to display  No results found.   PROCEDURES:  Critical Care performed: No  Procedures   MEDICATIONS ORDERED IN ED: Medications - No data to display   IMPRESSION / MDM / ASSESSMENT AND PLAN / ED COURSE  I reviewed the triage vital signs and the nursing notes.                               27 y.o. female presents to the emergency department for evaluation and treatment of dental pain. See HPI for further details.   Differential diagnosis includes, but is not limited to dental abscess, pulpitis, fracture  Physical exam reveals port intention and moderate gingivitis with multiple dental caries.  However there is no abscess noted.  There is tenderness to palpation over upper and lower gums.  There are multiple  broken teeth and moderate amount of plaque.  Patient admits to poor dental hygiene.  She has not been able to follow-up with a dentist but states she has a 90-day.  With her current job that we will allow her possible access.  He is provided with a list of local dental clinics walk-in and emergent.  Overall patient is in stable condition for discharge and out patient follow-up.  Patient will be described home with prescriptions for naproxen, and amoxicillin and lidocaine viscous in which patient reports helps with her pain.  Patient is to follow-up with a dentist in next 3 days.  Patient is given ED precautions to return to the ED for any worsening or new symptoms.  Patient verbalizes understanding.  All questions and concerns were addressed during this ED visit.  Patient's presentation is most consistent with acute, uncomplicated illness.  FINAL CLINICAL IMPRESSION(S) / ED DIAGNOSES   Final diagnoses:  Pain due to dental caries     Rx / DC Orders   ED Discharge Orders           Ordered    lidocaine (XYLOCAINE) 2 % solution  Every 4 hours PRN,   Status:  Discontinued        05/08/23 1840    naproxen (NAPROSYN) 500 MG tablet  2 times daily with meals,   Status:  Discontinued        05/08/23 1840    amoxicillin (AMOXIL) 875 MG tablet  2 times daily,   Status:  Discontinued        05/08/23 1840    amoxicillin (AMOXIL) 875 MG tablet  2 times daily        05/08/23 1850    lidocaine (XYLOCAINE) 2 % solution  Every 4 hours PRN        05/08/23 1850    naproxen (NAPROSYN) 500 MG tablet  2 times daily with meals        05/08/23 1850             Note:  This document was prepared using Dragon voice recognition software and may include unintentional dictation errors.    Romeo Apple, Cassiopeia Florentino A, PA-C 05/08/23 1941    [name] Post, MD 05/08/23 [phone]

## 2023-05-10 DIAGNOSIS — Z419 Encounter for procedure for purposes other than remedying health state, unspecified: Secondary | ICD-10-CM | POA: Diagnosis not present

## 2023-05-28 ENCOUNTER — Ambulatory Visit: Payer: Medicaid Other | Attending: Cardiovascular Disease

## 2023-06-10 DIAGNOSIS — Z419 Encounter for procedure for purposes other than remedying health state, unspecified: Secondary | ICD-10-CM | POA: Diagnosis not present

## 2023-06-30 ENCOUNTER — Encounter: Payer: Self-pay | Admitting: Cardiovascular Disease

## 2023-07-11 DIAGNOSIS — Z419 Encounter for procedure for purposes other than remedying health state, unspecified: Secondary | ICD-10-CM | POA: Diagnosis not present

## 2023-08-10 DIAGNOSIS — Z419 Encounter for procedure for purposes other than remedying health state, unspecified: Secondary | ICD-10-CM | POA: Diagnosis not present

## 2023-09-10 DIAGNOSIS — Z419 Encounter for procedure for purposes other than remedying health state, unspecified: Secondary | ICD-10-CM | POA: Diagnosis not present

## 2023-10-10 DIAGNOSIS — Z419 Encounter for procedure for purposes other than remedying health state, unspecified: Secondary | ICD-10-CM | POA: Diagnosis not present

## 2023-11-10 DIAGNOSIS — Z419 Encounter for procedure for purposes other than remedying health state, unspecified: Secondary | ICD-10-CM | POA: Diagnosis not present

## 2023-12-11 DIAGNOSIS — Z419 Encounter for procedure for purposes other than remedying health state, unspecified: Secondary | ICD-10-CM | POA: Diagnosis not present

## 2023-12-22 DIAGNOSIS — Z419 Encounter for procedure for purposes other than remedying health state, unspecified: Secondary | ICD-10-CM | POA: Diagnosis not present

## 2024-01-08 DIAGNOSIS — Z419 Encounter for procedure for purposes other than remedying health state, unspecified: Secondary | ICD-10-CM | POA: Diagnosis not present

## 2024-01-19 DIAGNOSIS — Z419 Encounter for procedure for purposes other than remedying health state, unspecified: Secondary | ICD-10-CM | POA: Diagnosis not present

## 2024-02-01 DIAGNOSIS — R197 Diarrhea, unspecified: Secondary | ICD-10-CM | POA: Diagnosis not present

## 2024-02-01 DIAGNOSIS — R52 Pain, unspecified: Secondary | ICD-10-CM | POA: Diagnosis not present

## 2024-02-01 DIAGNOSIS — R519 Headache, unspecified: Secondary | ICD-10-CM | POA: Diagnosis not present

## 2024-02-01 DIAGNOSIS — R509 Fever, unspecified: Secondary | ICD-10-CM | POA: Diagnosis not present

## 2024-02-01 DIAGNOSIS — R0981 Nasal congestion: Secondary | ICD-10-CM | POA: Diagnosis not present

## 2024-02-19 DIAGNOSIS — Z419 Encounter for procedure for purposes other than remedying health state, unspecified: Secondary | ICD-10-CM | POA: Diagnosis not present

## 2024-03-20 DIAGNOSIS — Z419 Encounter for procedure for purposes other than remedying health state, unspecified: Secondary | ICD-10-CM | POA: Diagnosis not present

## 2024-04-20 DIAGNOSIS — Z419 Encounter for procedure for purposes other than remedying health state, unspecified: Secondary | ICD-10-CM | POA: Diagnosis not present

## 2024-05-20 DIAGNOSIS — Z419 Encounter for procedure for purposes other than remedying health state, unspecified: Secondary | ICD-10-CM | POA: Diagnosis not present

## 2024-06-20 DIAGNOSIS — Z419 Encounter for procedure for purposes other than remedying health state, unspecified: Secondary | ICD-10-CM | POA: Diagnosis not present

## 2024-07-12 DIAGNOSIS — Z681 Body mass index (BMI) 19 or less, adult: Secondary | ICD-10-CM | POA: Diagnosis not present

## 2024-07-12 DIAGNOSIS — N3001 Acute cystitis with hematuria: Secondary | ICD-10-CM | POA: Diagnosis not present

## 2024-07-12 DIAGNOSIS — R300 Dysuria: Secondary | ICD-10-CM | POA: Diagnosis not present

## 2024-07-21 ENCOUNTER — Encounter: Payer: Self-pay | Admitting: Emergency Medicine

## 2024-07-21 ENCOUNTER — Ambulatory Visit: Admission: EM | Admit: 2024-07-21 | Discharge: 2024-07-21 | Disposition: A | Discharge status: Home / Self Care

## 2024-07-21 DIAGNOSIS — Z419 Encounter for procedure for purposes other than remedying health state, unspecified: Secondary | ICD-10-CM | POA: Diagnosis not present

## 2024-07-21 DIAGNOSIS — J069 Acute upper respiratory infection, unspecified: Secondary | ICD-10-CM | POA: Diagnosis not present

## 2024-07-21 MED ORDER — IPRATROPIUM BROMIDE 0.03 % NA SOLN: 2.0000 | Freq: Two times a day (BID) | NASAL | 0 refills | Status: AC

## 2024-07-21 MED ORDER — PREDNISONE 10 MG (21) PO TBPK: ORAL_TABLET | Freq: Every day | ORAL | 0 refills | Status: AC

## 2024-07-21 NOTE — ED Provider Notes (Signed)
 Laurie Richards    CSN: [id number] Arrival date & time: 07/21/24  1709      History   Chief Complaint No chief complaint on file.   HPI Laurie Richards is a 28 y.o. female.   Patient presents for evaluation of nasal congestion, sinus pressure to the bilateral cheeks, bilateral ear itching, soft diarrhea beginning 2 days ago.  Began to experience a nonproductive cough and generalized bodyaches today.  Known sick contact prior.  Took a home COVID test 4 days ago, negative, did not have symptoms at that time.  Denies fever, shortness of breath or wheezing.  Decreased appetite but tolerable to some food and liquids.  No known sick contact prior.  Has not attempted treatment    Past Medical History:  Diagnosis Date   Anxiety    Asthma    MILD NO INHALER USE   COVID 2021   LOSS OF TASTE AND SMELL SOB, COUGH X 1 WEEK ALL SYMPTOMS RESOLVED   GERD (gastroesophageal reflux disease)    Headache    MIGRAINES OCC ALSO RARELY   History of kidney stones    Irregular heartbeat    a. occasional skipped beats since age 62.   Marijuana abuse    a. smokes 1-2 x/wk.   PAF (paroxysmal atrial fibrillation) (HCC)    a. Dx 09/2017 in setting of mild hyperthyroidism.   Sepsis (HCC) 03-03-2021 to 03-10-2021 in [facility]   with e coli and acute pyelonephrisis   Tobacco abuse    a. smoking cigarettes since age 54.    Patient Active Problem List   Diagnosis Date Noted   Pyelonephritis 03/04/2021   Tachycardia 03/04/2021   Hypokalemia 03/04/2021   Sepsis (HCC) 03/04/2021   Ureteral stone with hydronephrosis 03/04/2021   Smoker 10/10/2017   Hyperthyroidism 10/10/2017   A-fib (HCC) 09/22/2017    Past Surgical History:  Procedure Laterality Date   APPENDECTOMY  2018   OPEN   CYSTOSCOPY W/ URETERAL STENT PLACEMENT Right 03/26/2021   Procedure: CYSTOSCOPY WITH RETROGRADE PYELOGRAM/URETROSCOPY/STONE BASKET EXTRACTION/URETERAL STENT REMOVAL;  Surgeon: Alvaro Hummer, MD;  Location: [facility];  Service: Urology;  Laterality: Right;   CYSTOSCOPY WITH URETEROSCOPY AND STENT PLACEMENT Right 03/04/2021   Procedure: CYSTOSCOPY, RETROGRADE PYELOGRAM, RIGHT STENT PLACEMENT;  Surgeon: Alvaro Hummer, MD;  Location: [facility] OR;  Service: Urology;  Laterality: Right;   MOUTH SURGERY     1 WISDOM TOOTH PULLED    OB History     Gravida  4   Para      Term      Preterm      AB  4   Living  0      SAB  4   IAB      Ectopic      Multiple      Live Births               Home Medications    Prior to Admission medications   Medication Sig Start Date End Date Taking? Authorizing Provider  ipratropium (ATROVENT ) 0.03 % nasal spray Place 2 sprays into both nostrils every 12 (twelve) hours. 07/21/24  Yes Ahron Hulbert R, NP  nitrofurantoin , macrocrystal-monohydrate, (MACROBID ) 100 MG capsule Take 100 mg by mouth 2 (two) times daily. 07/12/24  Yes [provider]  predniSONE  (STERAPRED UNI-PAK 21 TAB) 10 MG (21) TBPK tablet Take by mouth daily. Take 6 tabs by mouth daily  for 1 days, then 5 tabs for 1 days, then 4  tabs for 1 days, then 3 tabs for 1 days, 2 tabs for 1 days, then 1 tab by mouth daily for 1 days 07/21/24  Yes Rose Hippler R, NP  acetaminophen  (TYLENOL ) 325 MG tablet Take 650 mg by mouth every 6 (six) hours as needed.    [provider]  amoxicillin  (AMOXIL ) 875 MG tablet Take 1 tablet (875 mg total) by mouth 2 (two) times daily. 05/08/23   [name], [name], PA-C  cetirizine  (ZYRTEC  ALLERGY) 10 MG tablet Take 1 tablet (10 mg total) by mouth daily. Patient taking differently: Take 10 mg by mouth at bedtime. 01/16/21   [name], PA-C  cyclobenzaprine  (FLEXERIL ) 10 MG tablet Take 1 tablet (10 mg total) by mouth 2 (two) times daily as needed for muscle spasms. 03/01/23   [name], MD  famotidine  (PEPCID ) 20 MG tablet Take 1 tablet (20 mg total) by mouth 2 (two) times daily for 14 days. Patient taking differently: Take 20 mg by mouth  2 (two) times daily. 03/09/21 12/04/22  Krishnan, Gokul, MD  HYDROcodone -acetaminophen  (NORCO/VICODIN) 5-325 MG tablet Take 2 tablets by mouth every 6 (six) hours as needed. 02/26/23   Ward, Josette SAILOR, DO  lidocaine  (XYLOCAINE ) 2 % solution Use as directed 10 mLs in the mouth or throat every 4 (four) hours as needed for mouth pain. Swish, gargle, and spit 05/08/23   [name], [name], PA-C  metoprolol  succinate (TOPROL -XL) 25 MG 24 hr tablet Take 25 mg by mouth daily as needed.    [provider]  naproxen  (NAPROSYN ) 500 MG tablet Take 1 tablet (500 mg total) by mouth 2 (two) times daily with a meal. 05/08/23   [name], [name], PA-C  ondansetron  (ZOFRAN -ODT) 4 MG disintegrating tablet Take 1-2 tablets (4-8 mg total) by mouth every 8 (eight) hours as needed for nausea or vomiting. 02/26/23   Ward, Josette SAILOR, DO  polyethylene glycol (MIRALAX  / GLYCOLAX ) 17 g packet Take 17 g by mouth as needed for mild constipation or moderate constipation.    [provider]  senna (SENOKOT) 8.6 MG TABS tablet Take 2 tablets (17.2 mg total) by mouth daily. 03/10/21   Krishnan, Gokul, MD  diltiazem  (CARDIZEM ) 30 MG tablet Take 1 tablet (30 mg total) as needed by mouth. If HR more than 100 09/23/17 12/23/20  [name], MD    Family History Family History  Problem Relation Age of Onset   Heart failure Mother    Coronary artery disease Mother        Status post MI and stenting in her 30s   Coronary artery disease Maternal Uncle        Status post stenting at a young age   Coronary artery disease Maternal Uncle        Status post stenting at a young age   Diabetes Other     Social History Social History   Tobacco Use   Smoking status: Former    Current packs/day: 0.00    Average packs/day: 1 pack/day for 8.0 years (8.0 ttl pk-yrs)    Types: Cigarettes    Start date: 04/09/2012    Quit date: 04/09/2020    Years since quitting: 4.2   Smokeless tobacco: Never  Vaping Use   Vaping status:  Every Day   Substances: Nicotine , THC, Flavoring  Substance Use Topics   Alcohol use: Not Currently   Drug use: Yes    Types: Heroin, Methamphetamines, Marijuana    Comment: Last used marijuana at 1:02 pm on  12/04/2022.  HEROIN AND METH LAST USED JUNE  2021 PER PT     Allergies   Acetaminophen -codeine, Codeine, and Latex   Review of Systems Review of Systems   Physical Exam Triage Vital Signs ED Triage Vitals  Encounter Vitals Group     BP 07/21/24 1752 99/68     Girls Systolic BP Percentile --      Girls Diastolic BP Percentile --      Boys Systolic BP Percentile --      Boys Diastolic BP Percentile --      Pulse Rate 07/21/24 1752 75     Resp 07/21/24 1752 18     Temp 07/21/24 1752 98.4 F (36.9 C)     Temp Source 07/21/24 1752 Oral     SpO2 07/21/24 1752 97 %     Weight --      Height --      Head Circumference --      Peak Flow --      Pain Score 07/21/24 1748 6     Pain Loc --      Pain Education --      Exclude from Growth Chart --    No data found.  Updated Vital Signs BP 99/68 (BP Location: Right Arm)   Pulse 75   Temp 98.4 F (36.9 C) (Oral)   Resp 18   LMP 07/01/2024 (Exact Date)   SpO2 97%   Breastfeeding No   Visual Acuity Right Eye Distance:   Left Eye Distance:   Bilateral Distance:    Right Eye Near:   Left Eye Near:    Bilateral Near:     Physical Exam Constitutional:      Appearance: She is ill-appearing.  HENT:     Head: Normocephalic.     Right Ear: Tympanic membrane, ear canal and external ear normal.     Left Ear: Tympanic membrane, ear canal and external ear normal.     Nose: Congestion present.     Mouth/Throat:     Mouth: Mucous membranes are moist.     Pharynx: Oropharynx is clear. No oropharyngeal exudate or posterior oropharyngeal erythema.  Eyes:     Extraocular Movements: Extraocular movements intact.  Cardiovascular:     Rate and Rhythm: Normal rate and regular rhythm.     Pulses: Normal pulses.     Heart  sounds: Normal heart sounds.  Pulmonary:     Effort: Pulmonary effort is normal.     Breath sounds: Normal breath sounds.  Neurological:     Mental Status: She is alert and oriented to person, place, and time. Mental status is at baseline.      UC Treatments / Results  Labs (all labs ordered are listed, but only abnormal results are displayed) Labs Reviewed - No data to display  EKG   Radiology No results found.  Procedures Procedures (including critical care time)  Medications Ordered in UC Medications - No data to display  Initial Impression / Assessment and Plan / UC Course  I have reviewed the triage vital signs and the nursing notes.  Pertinent labs & imaging results that were available during my care of the patient were reviewed by me and considered in my medical decision making (see chart for details).  Viral URI with cough  Patient is in no signs of distress nor toxic appearing.  Vital signs are stable.  Low suspicion for pneumonia, pneumothorax or bronchitis and therefore will defer imaging.  Declined COVID testing, declined  strep testing.  Prescribed prednisone  and Atrovent  nasal spray.May use additional over-the-counter medications as needed for supportive care.  May follow-up with urgent care as needed if symptoms persist or worsen.   Final Clinical Impressions(s) / UC Diagnoses   Final diagnoses:  Viral URI with cough     Discharge Instructions      Your symptoms today are most likely being caused by a virus and should steadily improve in time it can take up to 7 to 10 days before you truly start to see a turnaround however things will get better  Starting tomorrow take prednisone  every morning with food to open and relax the airway making it easier to breathe, avoid ibuprofen  while taking  May use Atrovent  nasal spray twice daily to help clear out congestion    You can take Tylenol  as needed for fever reduction and pain relief.   For cough: honey  1/2 to 1 teaspoon (you can dilute the honey in water  or another fluid).  You can also use guaifenesin  and dextromethorphan for cough. You can use a humidifier for chest congestion and cough.  If you don't have a humidifier, you can sit in the bathroom with the hot shower running.      For sore throat: try warm salt water  gargles, cepacol lozenges, throat spray, warm tea or water  with lemon/honey, popsicles or ice, or OTC cold relief medicine for throat discomfort.   For congestion: take a daily anti-histamine like Zyrtec , Claritin , and a oral decongestant, such as pseudoephedrine .  You can also use Flonase  1-2 sprays in each nostril daily.   It is important to stay hydrated: drink plenty of fluids (water , gatorade/powerade/pedialyte, juices, or teas) to keep your throat moisturized and help further relieve irritation/discomfort.    ED Prescriptions     Medication Sig Dispense Auth. Provider   predniSONE  (STERAPRED UNI-PAK 21 TAB) 10 MG (21) TBPK tablet Take by mouth daily. Take 6 tabs by mouth daily  for 1 days, then 5 tabs for 1 days, then 4 tabs for 1 days, then 3 tabs for 1 days, 2 tabs for 1 days, then 1 tab by mouth daily for 1 days 21 tablet Jannetta Massey R, NP   ipratropium (ATROVENT ) 0.03 % nasal spray Place 2 sprays into both nostrils every 12 (twelve) hours. 30 mL Tychelle Shelba SAUNDERS, NP      PDMP not reviewed this encounter.   Kiaya Shelba SAUNDERS, NP 07/21/24 1819

## 2024-07-21 NOTE — ED Triage Notes (Signed)
 Patient reports cough, sore throat, fatigue and body aches x 1 day. Patient has not taken anything for symptoms.  Rates bodyaches 6/10 and sore throat 5/10

## 2024-07-21 NOTE — Discharge Instructions (Signed)
 Your symptoms today are most likely being caused by a virus and should steadily improve in time it can take up to 7 to 10 days before you truly start to see a turnaround however things will get better  Starting tomorrow take prednisone  every morning with food to open and relax the airway making it easier to breathe, avoid ibuprofen  while taking  May use Atrovent  nasal spray twice daily to help clear out congestion    You can take Tylenol  as needed for fever reduction and pain relief.   For cough: honey 1/2 to 1 teaspoon (you can dilute the honey in water  or another fluid).  You can also use guaifenesin  and dextromethorphan for cough. You can use a humidifier for chest congestion and cough.  If you don't have a humidifier, you can sit in the bathroom with the hot shower running.      For sore throat: try warm salt water  gargles, cepacol lozenges, throat spray, warm tea or water  with lemon/honey, popsicles or ice, or OTC cold relief medicine for throat discomfort.   For congestion: take a daily anti-histamine like Zyrtec , Claritin , and a oral decongestant, such as pseudoephedrine .  You can also use Flonase  1-2 sprays in each nostril daily.   It is important to stay hydrated: drink plenty of fluids (water , gatorade/powerade/pedialyte, juices, or teas) to keep your throat moisturized and help further relieve irritation/discomfort.

## 2024-07-28 DIAGNOSIS — I4891 Unspecified atrial fibrillation: Secondary | ICD-10-CM | POA: Diagnosis not present

## 2024-08-22 DIAGNOSIS — S161XXA Strain of muscle, fascia and tendon at neck level, initial encounter: Secondary | ICD-10-CM | POA: Diagnosis not present

## 2024-08-22 DIAGNOSIS — F17210 Nicotine dependence, cigarettes, uncomplicated: Secondary | ICD-10-CM | POA: Diagnosis not present

## 2024-08-22 DIAGNOSIS — F17220 Nicotine dependence, chewing tobacco, uncomplicated: Secondary | ICD-10-CM | POA: Diagnosis not present

## 2025-01-04 ENCOUNTER — Encounter

## 2025-01-25 ENCOUNTER — Encounter: Admitting: Family Medicine
